# Patient Record
Sex: Female | Born: 1990 | Race: Black or African American | Hispanic: No | Marital: Single | Smoking: Never smoker
Health system: Southern US, Community
[De-identification: ages and names within clinical notes are randomized; demographics above are authoritative.]

## PROBLEM LIST (undated history)

## (undated) ENCOUNTER — Inpatient Hospital Stay (HOSPITAL_COMMUNITY): Payer: Self-pay

## (undated) ENCOUNTER — Emergency Department (HOSPITAL_COMMUNITY): Admission: EM | Payer: Self-pay | Source: Home / Self Care

## (undated) DIAGNOSIS — Q249 Congenital malformation of heart, unspecified: Secondary | ICD-10-CM

## (undated) DIAGNOSIS — O139 Gestational [pregnancy-induced] hypertension without significant proteinuria, unspecified trimester: Secondary | ICD-10-CM

## (undated) DIAGNOSIS — D649 Anemia, unspecified: Secondary | ICD-10-CM

## (undated) DIAGNOSIS — F419 Anxiety disorder, unspecified: Secondary | ICD-10-CM

## (undated) DIAGNOSIS — I10 Essential (primary) hypertension: Secondary | ICD-10-CM

## (undated) HISTORY — PX: NO PAST SURGERIES: SHX2092

## (undated) HISTORY — DX: Congenital malformation of heart, unspecified: Q24.9

## (undated) HISTORY — DX: Anxiety disorder, unspecified: F41.9

## (undated) HISTORY — DX: Anemia, unspecified: D64.9

---
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
## 2011-10-16 ENCOUNTER — Ambulatory Visit: Payer: Managed Care, Other (non HMO)

## 2011-10-16 DIAGNOSIS — H66019 Acute suppurative otitis media with spontaneous rupture of ear drum, unspecified ear: Secondary | ICD-10-CM

## 2011-10-16 DIAGNOSIS — H9209 Otalgia, unspecified ear: Secondary | ICD-10-CM

## 2012-01-12 ENCOUNTER — Encounter (HOSPITAL_COMMUNITY): Payer: Self-pay | Admitting: *Deleted

## 2012-01-12 ENCOUNTER — Emergency Department (HOSPITAL_COMMUNITY)
Admission: EM | Admit: 2012-01-12 | Discharge: 2012-01-13 | Disposition: A | Discharge status: Home / Self Care | Payer: Managed Care, Other (non HMO) | Attending: Emergency Medicine | Admitting: Emergency Medicine

## 2012-01-12 DIAGNOSIS — J111 Influenza due to unidentified influenza virus with other respiratory manifestations: Secondary | ICD-10-CM

## 2012-01-12 DIAGNOSIS — R000 Tachycardia, unspecified: Secondary | ICD-10-CM | POA: Insufficient documentation

## 2012-01-12 HISTORY — DX: Essential (primary) hypertension: I10

## 2012-01-12 MED ORDER — ACETAMINOPHEN 325 MG PO TABS
650.0000 mg | ORAL_TABLET | Freq: Once | ORAL | Status: AC
  Administered 2012-01-12: 650 mg via ORAL
  Filled 2012-01-12: qty 2

## 2012-01-12 NOTE — ED Notes (Signed)
Headache temp and body aches for 2-3 days.     Aching all over with a  Cold.  lmp  wed

## 2012-01-13 MED ORDER — SODIUM CHLORIDE 0.9 % IV BOLUS (SEPSIS)
1000.0000 mL | Freq: Once | INTRAVENOUS | Status: AC
  Administered 2012-01-13: 1000 mL via INTRAVENOUS

## 2012-01-13 NOTE — ED Notes (Signed)
Pt here for fever, chills, body aches, on and off over seveal days, sts feels somewhat better

## 2012-01-13 NOTE — ED Provider Notes (Signed)
History     CSN: [id number]  Arrival date & time 01/12/12  2041   First MD Initiated Contact with Patient 01/13/12 0003      Chief Complaint  Patient presents with  . Headache    (Consider location/radiation/quality/duration/timing/severity/associated sxs/prior treatment) HPI Comments: Patient here with fever, chills, headache, body aches on and off for the past several days - has been taking tylenol and motrin which helps with the symptoms but states continues to feel poorly - denies nausea, vomiting, neck stiffness, cough, chest congestion, reports runny nose, denies abdominal pain, vaginal discharge   Patient is a 21 y.o. female presenting with headaches. The history is provided by the patient. No language interpreter was used.  Headache  This is a new problem. The current episode started 2 days ago. The problem occurs constantly. The problem has not changed since onset.The headache is associated with nothing. The pain is located in the frontal region. The quality of the pain is described as throbbing. The pain is at a severity of 7/10. The pain is moderate. The pain does not radiate. Associated symptoms include a fever and malaise/fatigue. Pertinent negatives include no anorexia, no chest pressure, no near-syncope, no orthopnea, no palpitations, no syncope, no shortness of breath, no nausea and no vomiting. She has tried nothing for the symptoms. The treatment provided no relief.    Past Medical History  Diagnosis Date  . Hypertension     History reviewed. No pertinent past surgical history.  No family history on file.  History  Substance Use Topics  . Smoking status: Never Smoker   . Smokeless tobacco: Not on file  . Alcohol Use: No    OB History    Grav Para Term Preterm Abortions TAB SAB Ect Mult Living                  Review of Systems  Constitutional: Positive for fever and malaise/fatigue.  Respiratory: Negative for shortness of breath.   Cardiovascular:  Negative for palpitations, orthopnea, syncope and near-syncope.  Gastrointestinal: Negative for nausea, vomiting and anorexia.  Neurological: Positive for headaches.  All other systems reviewed and are negative.    Allergies  Review of patient's allergies indicates no known allergies.  Home Medications   Current Outpatient Rx  Name Route Sig Dispense Refill  . AMLODIPINE BESYLATE 5 MG PO TABS Oral Take 5 mg by mouth daily.      BP 141/87  Pulse 114  Temp(Src) 98.9 F (37.2 C) (Oral)  Resp 20  SpO2 100%  LMP 01/10/2012  Physical Exam  Nursing note and vitals reviewed. Constitutional: She is oriented to person, place, and time. She appears well-developed and well-nourished. No distress.  HENT:  Head: Normocephalic and atraumatic.  Right Ear: External ear normal.  Left Ear: External ear normal.  Nose: Nose normal.  Mouth/Throat: Oropharynx is clear and moist. No oropharyngeal exudate.  Eyes: Conjunctivae are normal. Pupils are equal, round, and reactive to light. No scleral icterus.  Neck: Normal range of motion. Neck supple.  Cardiovascular: Regular rhythm and normal heart sounds.  Exam reveals no gallop and no friction rub.   No murmur heard.      tachycardia  Pulmonary/Chest: Effort normal and breath sounds normal. No respiratory distress. She has no wheezes. She has no rales. She exhibits no tenderness.  Abdominal: Soft. Bowel sounds are normal. She exhibits no distension. There is no tenderness.  Musculoskeletal: Normal range of motion. She exhibits no edema and no tenderness.  Lymphadenopathy:  She has no cervical adenopathy.  Neurological: She is alert and oriented to person, place, and time. No cranial nerve deficit. She exhibits normal muscle tone. Coordination normal.  Skin: Skin is warm and dry. No rash noted. No erythema. No pallor.  Psychiatric: She has a normal mood and affect. Her behavior is normal. Judgment and thought content normal.    ED Course    Procedures (including critical care time)  Labs Reviewed - No data to display No results found.   1. Influenza-like illness       MDM  Patient remains tachycardic despite 1 liter of fluids, she reports that she feels much better - states tyelnol has helped her feel better, I believe the tachycardia to be related to the fever and ILI illness.  As she feels better, will discharge home.        Izola Price [place], [place] 01/13/12 [phone]

## 2012-01-13 NOTE — ED Provider Notes (Signed)
Medical screening examination/treatment/procedure(s) were performed by non-physician practitioner and as supervising physician I was immediately available for consultation/collaboration.  Olivia Mackie, MD 01/13/12 2131

## 2012-01-13 NOTE — Discharge Instructions (Signed)
Influenza Facts Flu (influenza) is a contagious respiratory illness caused by the influenza viruses. It can cause mild to severe illness. While most healthy people recover from the flu without specific treatment and without complications, older people, young children, and people with certain health conditions are at higher risk for serious complications from the flu, including death. CAUSES   The flu virus is spread from person to person by respiratory droplets from coughing and sneezing.   A person can also become infected by touching an object or surface with a virus on it and then touching their mouth, eye or nose.   Adults may be able to infect others from 1 day before symptoms occur and up to 7 days after getting sick. So it is possible to give someone the flu even before you know you are sick and continue to infect others while you are sick.  SYMPTOMS   Fever (usually high).   Headache.   Tiredness (can be extreme).   Cough.   Sore throat.   Runny or stuffy nose.   Body aches.   Diarrhea and vomiting may also occur, particularly in children.   These symptoms are referred to as "flu-like symptoms". A lot of different illnesses, including the common cold, can have similar symptoms.  DIAGNOSIS   There are tests that can determine if you have the flu as long you are tested within the first 2 or 3 days of illness.   A doctor's exam and additional tests may be needed to identify if you have a disease that is a complicating the flu.  RISKS AND COMPLICATIONS  Some of the complications caused by the flu include:  Bacterial pneumonia or progressive pneumonia caused by the flu virus.   Loss of body fluids (dehydration).   Worsening of chronic medical conditions, such as heart failure, asthma, or diabetes.   Sinus problems and ear infections.  HOME CARE INSTRUCTIONS   Seek medical care early on.   If you are at high risk from complications of the flu, consult your health-care  provider as soon as you develop flu-like symptoms. Those at high risk for complications include:   People 65 years or older.   People with chronic medical conditions, including diabetes.   Pregnant women.   Young children.   Your caregiver may recommend use of an antiviral medication to help treat the flu.   If you get the flu, get plenty of rest, drink a lot of liquids, and avoid using alcohol and tobacco.   You can take over-the-counter medications to relieve the symptoms of the flu if your caregiver approves. (Never give aspirin to children or teenagers who have flu-like symptoms, particularly fever).  PREVENTION  The single best way to prevent the flu is to get a flu vaccine each fall. Other measures that can help protect against the flu are:  Antiviral Medications   A number of antiviral drugs are approved for use in preventing the flu. These are prescription medications, and a doctor should be consulted before they are used.   Habits for Good Health   Cover your nose and mouth with a tissue when you cough or sneeze, throw the tissue away after you use it.   Wash your hands often with soap and water, especially after you cough or sneeze. If you are not near water, use an alcohol-based hand cleaner.   Avoid people who are sick.   If you get the flu, stay home from work or school. Avoid contact with   other people so that you do not make them sick, too.   Try not to touch your eyes, nose, or mouth as germs ore often spread this way.  IN CHILDREN, EMERGENCY WARNING SIGNS THAT NEED URGENT MEDICAL ATTENTION:  Fast breathing or trouble breathing.   Bluish skin color.   Not drinking enough fluids.   Not waking up or not interacting.   Being so irritable that the child does not want to be held.   Flu-like symptoms improve but then return with fever and worse cough.   Fever with a rash.  IN ADULTS, EMERGENCY WARNING SIGNS THAT NEED URGENT MEDICAL ATTENTION:  Difficulty  breathing or shortness of breath.   Pain or pressure in the chest or abdomen.   Sudden dizziness.   Confusion.   Severe or persistent vomiting.  SEEK IMMEDIATE MEDICAL CARE IF:  You or someone you know is experiencing any of the symptoms above. When you arrive at the emergency center,report that you think you have the flu. You may be asked to wear a mask and/or sit in a secluded area to protect others from getting sick. MAKE SURE YOU:   Understand these instructions.   Monitor your condition.   Seek medical care if you are getting worse, or not improving.  Document Released: 09/21/2003 Document Revised: 09/07/2011 Document Reviewed: 06/17/2009 [facility] Patient Information 2012 [place], [place].Influenza, Adult Influenza ("the flu") is a viral infection of the respiratory tract. It causes chills, fever, cough, headache, body aches, and sore throat. Influenza in general will make you feel sicker than when you have a common cold. Symptoms of the illness typically last a few days. Cough and fatigue may continue for as long as 7 to 10 days. Influenza is highly contagious. It spreads easily to others in the droplets from coughs and sneezes. People frequently become infected by touching something that was recently contaminated with the virus and then touch their mouth, nose or eyes. This infection is caused by a virus. Symptoms will not be reduced or improved by taking an antibiotic. Antibiotics are medications that kill bacteria, not viruses. DIAGNOSIS  Diagnosis of influenza is often made based on the history and physical examination as well as the presence of influenza reports occurring in your community. Testing can be done if the diagnosis is not certain. TREATMENT  Since influenza is caused by a virus, antibiotics are not helpful. Your caregiver may prescribe antiviral medicines to shorten the illness and lessen the severity. Your caregiver may also recommend influenza vaccination and/or  antiviral medicines for your family members in order to prevent the spread of influenza to them. HOME CARE INSTRUCTIONS  DO NOT GIVE ASPIRIN TO PERSONS WITH INFLUENZA WHO ARE UNDER 55 YEARS OF AGE. This could lead to brain and liver damage (Reye's syndrome). Read the label on over-the-counter medicines.   Stay home from work or school if at all possible until most of your symptoms are gone.   Only take over-the-counter or prescription medicines for pain, discomfort, or fever as directed by your caregiver.   Use a cool mist humidifier to increase air moisture. This will make breathing easier.   Rest until your temperature is nearly normal: 98.6 F (37 C). This usually takes 3 to 4 days. Be sure you get plenty of rest.   Drink at least eight, eight-ounce glasses of fluids per day. Fluids include water, juice, broth, gelatin, or lemonade.   Cover your mouth and nose when coughing or sneezing and wash your hands often to  prevent the spread of this virus to other persons.  PREVENTION  Annual influenza vaccination (flu shots) is the best way to avoid getting influenza. An annual flu shot is now routinely recommended for all adults in the [place] SEEK MEDICAL CARE IF:   You develop shortness of breath while resting.   You have a deep cough with production of mucous or chest pain.   You develop nausea (feeling sick to your stomach), vomiting, or diarrhea.  SEEK IMMEDIATE MEDICAL CARE IF:   You have difficulty breathing, become short of breath, or your skin or nails turn bluish.   You develop severe neck pain or stiffness.   You develop a severe headache, facial pain, or earache.   You have a fever.   You develop nausea or vomiting that cannot be controlled.  Document Released: 09/15/2000 Document Revised: 09/07/2011 Document Reviewed: 07/21/2009 [facility] Patient Information 2012 [place], [place].

## 2012-01-13 NOTE — ED Notes (Signed)
Unable to access signature page

## 2012-01-16 ENCOUNTER — Ambulatory Visit: Payer: Managed Care, Other (non HMO) | Admitting: Internal Medicine

## 2012-01-16 DIAGNOSIS — N12 Tubulo-interstitial nephritis, not specified as acute or chronic: Secondary | ICD-10-CM

## 2012-01-16 DIAGNOSIS — Z6833 Body mass index (BMI) 33.0-33.9, adult: Secondary | ICD-10-CM

## 2012-01-16 DIAGNOSIS — Z202 Contact with and (suspected) exposure to infections with a predominantly sexual mode of transmission: Secondary | ICD-10-CM

## 2012-01-16 DIAGNOSIS — L83 Acanthosis nigricans: Secondary | ICD-10-CM | POA: Insufficient documentation

## 2012-01-16 DIAGNOSIS — R509 Fever, unspecified: Secondary | ICD-10-CM

## 2012-01-16 DIAGNOSIS — N10 Acute tubulo-interstitial nephritis: Secondary | ICD-10-CM

## 2012-01-16 DIAGNOSIS — I517 Cardiomegaly: Secondary | ICD-10-CM

## 2012-01-16 LAB — POCT URINALYSIS DIPSTICK
Glucose, UA: NEGATIVE
Nitrite, UA: POSITIVE
Spec Grav, UA: 1.02
Urobilinogen, UA: 1
pH, UA: 5.5

## 2012-01-16 LAB — POCT CBC
Granulocyte percent: 73 %G (ref 37–80)
HCT, POC: 34.2 % — AB (ref 37.7–47.9)
Hemoglobin: 10.7 g/dL — AB (ref 12.2–16.2)
Lymph, poc: 2.5 (ref 0.6–3.4)
MCH, POC: 29.1 pg (ref 27–31.2)
MCHC: 31.3 g/dL — AB (ref 31.8–35.4)
MCV: 92.9 fL (ref 80–97)
MID (cbc): 0.9 (ref 0–0.9)
MPV: 7 fL (ref 0–99.8)
POC Granulocyte: 9.2 — AB (ref 2–6.9)
POC LYMPH PERCENT: 20 %L (ref 10–50)
POC MID %: 7 %M (ref 0–12)
Platelet Count, POC: 342 10*3/uL (ref 142–424)
RBC: 3.68 M/uL — AB (ref 4.04–5.48)
RDW, POC: 13.3 %
WBC: 12.6 10*3/uL — AB (ref 4.6–10.2)

## 2012-01-16 LAB — POCT UA - MICROSCOPIC ONLY
Casts, Ur, LPF, POC: NEGATIVE
Crystals, Ur, HPF, POC: NEGATIVE
Yeast, UA: NEGATIVE

## 2012-01-16 LAB — POCT URINE PREGNANCY: Preg Test, Ur: NEGATIVE

## 2012-01-16 LAB — POCT GLYCOSYLATED HEMOGLOBIN (HGB A1C): Hemoglobin A1C: 5.4

## 2012-01-16 MED ORDER — CEFTRIAXONE SODIUM 1 G IJ SOLR
1.0000 g | INTRAMUSCULAR | Status: DC
  Administered 2012-01-16: 1 g via INTRAMUSCULAR

## 2012-01-16 MED ORDER — CEFTRIAXONE SODIUM 1 G IJ SOLR
1.0000 g | Freq: Once | INTRAMUSCULAR | Status: AC
  Administered 2012-01-16: 1 g via INTRAMUSCULAR

## 2012-01-16 NOTE — Progress Notes (Signed)
Subjective:    Patient ID: Patty Gilmore, female    DOB: Dec 01, 1990, 21 y.o.   MRN: [id number]  HPIComplaining of headache and high fever for 4 or 5 days. Went to the ER on 4/12 with a diagnosis of influenza. Said she had blood work and was told was normal but there is nothing in chart review. Denies dysuria or frequency or urgency. Has occasional mild abdominal pain but nothing consistent. Last menstrual period was 8 days ago and was normal. She has recently had intercourse without barrier protection or any other form of contraception. There is no vaginal discharge since the last menses.    Would be interested in contraception  -Past history-concentric left ventricular hypertrophy-Last echo 2009 at [facility]/hypertension/Chlamydia 2009/obesity/Early atherosclerosis/acanthosis nigrans Review of Systems  Constitutional: Positive for chills.  HENT: Negative for ear pain, congestion, sore throat, trouble swallowing and neck pain.   Eyes: Negative for discharge.  Respiratory: Negative for cough.   Gastrointestinal: Negative for nausea, vomiting, diarrhea and abdominal distention.  Genitourinary: Negative for hematuria, genital sores, menstrual problem and pelvic pain.  Musculoskeletal: Negative for back pain and arthralgias.  Skin: Negative for rash.  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 Objective:   Physical ExamFever 102/Obviously uncomfortable HEENT is clear No anterior cervical adenopathy Lungs are clear Heart has a sustained tachycardia at 130 without murmurs The abdomen is soft nontender and nondistended without masses or organomegaly There is positive percussion tenderness radiating to the abdomen from the right costovertebral angle Skin has changes of acanthosis     Results for orders placed in visit on 01/16/12  POCT UA - MICROSCOPIC ONLY   Component Value Range   WBC, Ur, HPF, POC tntc     RBC, urine, microscopic 0-2     Bacteria, U Microscopic 3+     Mucus, UA small     Epithelial cells, urine per micros 2-10     Crystals, Ur, HPF, POC neg     Casts, Ur, LPF, POC neg     Yeast, UA neg     Amorphous moderate    POCT URINALYSIS DIPSTICK      Component Value Range   Color, UA yellow     Clarity, UA sl. cloudy     Glucose, UA neg     Bilirubin, UA small     Ketones, UA 80mg      Spec Grav, UA 1.020     Blood, UA small     pH, UA 5.5     Protein, UA 100mg      Urobilinogen, UA 1.0     Nitrite, UA positive     Leukocytes, UA small (1+)    POCT CBC      Component Value Range   WBC 12.6 (*) 4.6 - 10.2 (K/uL)   Lymph, poc 2.5  0.6 - 3.4    POC LYMPH PERCENT 20.0  10 - 50 (%L)   MID (cbc) 0.9  0 - 0.9    POC MID % 7.0  0 - 12 (%M)   POC Granulocyte 9.2 (*) 2 - 6.9    Granulocyte percent 73.0  37 - 80 (%G)   RBC 3.68 (*) 4.04 - 5.48 (M/uL)   Hemoglobin 10.7 (*) 12.2 - 16.2 (g/dL)   HCT, POC 40.9 (*) 81.1 - 47.9 (%)   MCV 92.9  80 - 97 (  fL)   MCH, POC 29.1  27 - 31.2 (pg)   MCHC 31.3 (*) 31.8 - 35.4 (g/dL)   RDW, POC 84.6     Platelet Count, POC 342  142 - 424 (K/uL)   MPV 7.0  0 - 99.8 (fL)  POCT URINE PREGNANCY      Component Value Range   Preg Test, Ur Negative    POCT GLYCOSYLATED HEMOGLOBIN (HGB A1C)      Component Value Range   Hemoglobin A1C 5.4      Assessment & Plan:  Problem #1 acute pyelonephritis Rocephin 1 g IM Cipro 500 twice a day #20 If still febrile tomorrow will do another injection otherwise  followup on Friday morning at 8 AM  Problem #2 sexually active without contraception On Friday Will start pills or DEPO and will do a uriprobe Add HIV and RPR today  Problem #3 obesity with acanthosis, hypertension, left ventricular hypertrophy, and a history of early atherosclerotic disease mention in the chart-she needs a hemoglobin A1c today  Problem #4 anemia with normal indices-she has had a  normal CBC in the past/we will recheck

## 2012-01-17 ENCOUNTER — Telehealth: Payer: Self-pay

## 2012-01-17 LAB — RPR

## 2012-01-17 LAB — HIV ANTIBODY (ROUTINE TESTING W REFLEX): HIV: NONREACTIVE

## 2012-01-17 MED ORDER — CIPROFLOXACIN HCL 500 MG PO TABS: 500.0000 mg | ORAL_TABLET | Freq: Two times a day (BID) | ORAL | Status: AC

## 2012-01-17 MED ORDER — TRAMADOL HCL 50 MG PO TABS: 50.0000 mg | ORAL_TABLET | Freq: Three times a day (TID) | ORAL | Status: AC | PRN

## 2012-01-17 NOTE — Telephone Encounter (Signed)
Pt was seen in office for kidney infection pt mom states Dr Merla Riches was supposed to have called a rx into [facility], she states she went to pick it up but rx was not there, she would also like to let Merla Riches know that pt is in a lot of pain and can something be called in for that as well.

## 2012-01-17 NOTE — Telephone Encounter (Signed)
Sent in RX from Dr Merla Riches note to [employer]. Mom said pt didn't do well as far as pain goes and wanted to know if we could call something in for pain. Please advise

## 2012-01-17 NOTE — Telephone Encounter (Signed)
Done, and sent in.  Patty Gilmore

## 2012-01-18 NOTE — Telephone Encounter (Signed)
Spoke with patient and let her know that rx was called into pharmacy.  Patient understood and will pick it up today.

## 2012-01-19 ENCOUNTER — Ambulatory Visit: Payer: Managed Care, Other (non HMO) | Admitting: Internal Medicine

## 2012-01-19 VITALS — BP 114/74 | HR 85 | Temp 98.0°F | Resp 16 | Ht 63.5 in | Wt 190.0 lb

## 2012-01-19 DIAGNOSIS — Z202 Contact with and (suspected) exposure to infections with a predominantly sexual mode of transmission: Secondary | ICD-10-CM

## 2012-01-19 DIAGNOSIS — N390 Urinary tract infection, site not specified: Secondary | ICD-10-CM

## 2012-01-19 DIAGNOSIS — Z23 Encounter for immunization: Secondary | ICD-10-CM

## 2012-01-19 MED ORDER — NORGESTIMATE-ETH ESTRADIOL 0.25-35 MG-MCG PO TABS: 1.0000 | ORAL_TABLET | Freq: Every day | ORAL | Status: DC

## 2012-01-19 NOTE — Progress Notes (Signed)
  Subjective:    Patient ID: Patty Gilmore, female    DOB: 01-23-1991, 21 y.o.   MRN: [id number]  HPIHere for followup of acute pyelonephritis Fever back pain both resolved/she feels back to normal with no urinary symptoms//  It is time for gardasil #3 She would like to start oral contraceptives Pap smear May 2012 within normal limits including STD screening One partner x6 months usually with condoms    Review of SystemsNoncontributory Note left ventricular hypertrophy with hypertension/last seen at [facility] in 2009 She has no current symptoms of chest pain shortness of breath with activity, or edema     Objective:   Physical Exam Overweight       Assessment & Plan:

## 2012-01-20 LAB — GC/CHLAMYDIA PROBE AMP, URINE
Chlamydia, Swab/Urine, PCR: NEGATIVE
GC Probe Amp, Urine: NEGATIVE

## 2012-01-20 LAB — URINE CULTURE: Colony Count: >=100000

## 2012-01-21 ENCOUNTER — Encounter: Payer: Self-pay | Admitting: Internal Medicine

## 2012-03-26 ENCOUNTER — Other Ambulatory Visit: Payer: Self-pay | Admitting: Family Medicine

## 2012-05-22 ENCOUNTER — Other Ambulatory Visit: Payer: Self-pay | Admitting: Physician Assistant

## 2012-07-18 ENCOUNTER — Other Ambulatory Visit: Payer: Self-pay | Admitting: Physician Assistant

## 2012-09-15 ENCOUNTER — Ambulatory Visit: Payer: Managed Care, Other (non HMO)

## 2012-10-16 ENCOUNTER — Encounter: Payer: Managed Care, Other (non HMO) | Admitting: Internal Medicine

## 2012-12-04 ENCOUNTER — Encounter: Payer: Managed Care, Other (non HMO) | Admitting: Internal Medicine

## 2012-12-25 ENCOUNTER — Ambulatory Visit: Payer: Managed Care, Other (non HMO) | Admitting: Physician Assistant

## 2012-12-25 VITALS — BP 136/86 | HR 102 | Temp 98.1°F | Resp 16 | Ht 62.5 in | Wt 192.2 lb

## 2012-12-25 DIAGNOSIS — I10 Essential (primary) hypertension: Secondary | ICD-10-CM

## 2012-12-25 DIAGNOSIS — N926 Irregular menstruation, unspecified: Secondary | ICD-10-CM

## 2012-12-25 DIAGNOSIS — N920 Excessive and frequent menstruation with regular cycle: Secondary | ICD-10-CM

## 2012-12-25 DIAGNOSIS — Z113 Encounter for screening for infections with a predominantly sexual mode of transmission: Secondary | ICD-10-CM

## 2012-12-25 LAB — POCT WET PREP WITH KOH
Clue Cells Wet Prep HPF POC: NEGATIVE
KOH Prep POC: NEGATIVE
Trichomonas, UA: NEGATIVE
WBC Wet Prep HPF POC: NEGATIVE
Yeast Wet Prep HPF POC: NEGATIVE

## 2012-12-25 LAB — POCT URINE PREGNANCY: Preg Test, Ur: NEGATIVE

## 2012-12-25 MED ORDER — AMLODIPINE BESYLATE 5 MG PO TABS: 5.0000 mg | ORAL_TABLET | Freq: Every day | ORAL | Status: DC

## 2012-12-25 NOTE — Patient Instructions (Signed)
Your tests today are all negative.  Let's watch and wait for the next week or so.  This may just been an irregular cycle for you.  If symptoms worsen though, please let us know.  I will let you know when the rest of your labs are back.   Safer Sex Your caregiver wants you to have this information about the infections that can be transmitted from sexual contact and how to prevent them. The idea behind safer sex is that you can be sexually active, and at the same time reduce the risk of giving or getting a sexually transmitted disease (STD). Every person should be aware of how to prevent him or herself and his or her sex partner from getting an STD. CAUSES OF STDS STDs are transmitted by sharing body fluids, which contain viruses and bacteria. The following fluids all transmit infections during sexual intercourse and sex acts:  Semen.  Saliva.  Urine.  Blood.  Vaginal mucus. Examples of STDs include:  Chlamydia.  Gonorrhea.  Genital herpes.  Hepatitis B.  Human immunodeficiency virus or acquired immunodeficiency syndrome (HIV or AIDS).  Syphilis.  Trichomonas.  Pubic lice.  Human papillomavirus (HPV), which may include:  Genital warts.  Cervical dysplasia.  Cervical cancer (can develop with certain types of HPV). SYMPTOMS  Sexual diseases often cause few or no symptoms until they are advanced, so a person can be infected and spread the infection without knowing it. Some STDs respond to treatment very well. Others, like HIV and herpes, cannot be cured, but are treated to reduce their effects. Specific symptoms include:  Abnormal vaginal discharge.  Irritation or itching in and around the vagina, and in the pubic hair.  Pain during sexual intercourse.  Bleeding during sexual intercourse.  Pelvic or abdominal pain.  Fever.  Growths in and around the vagina.  An ulcer in or around the vagina.  Swollen glands in the groin area. DIAGNOSIS   Blood tests.  Pap  test.  Culture test of abnormal vaginal discharge.  A test that applies a solution and examines the cervix with a lighted magnifying scope (colposcopy).  A test that examines the pelvis with a lighted tube, through a small incision (laparoscopy). TREATMENT  The treatment will depend on the cause of the STD.  Antibiotic treatment by injection, oral, creams, or suppositories in the vagina.  Over-the-counter medicated shampoo, to get rid of pubic lice.  Removing or treating growths with medicine, freezing, burning (electrocautery), or surgery.  Surgery treatment for HPV of the cervix.  Supportive medicines for herpes, HIV, AIDS, and hepatitis. Being careful cannot eliminate all risk of infection, but sex can be made much safer. Safe sexual practices include body massage and gentle touching. Masturbation is safe, as long as body fluids do not contact skin that has sores or cuts. Dry kissing and oral sex on a man wearing a latex condom or on a woman wearing a female condom is also safe. Slightly less safe is intercourse while the man wears a latex condom or wet kissing. It is also safer to have one sex partner that you know is not having sex with anyone else. LENGTH OF ILLNESS An STD might be treated and cured in a week, sometimes a month, or more. And it can linger with symptoms for many years. STDs can also cause damage to the female organs. This can cause chronic pain, infertility, and recurrence of the STD, especially herpes, hepatitis, HIV, and HPV. HOME CARE INSTRUCTIONS AND PREVENTION  Alcohol  and recreational drugs are often the reason given for not practicing safer sex. These substances affect your judgment. Alcohol and recreational drugs can also impair your immune system, making you more vulnerable to disease.  Do not engage in risky and dangerous sexual practices, including:  Vaginal or anal sex without a condom.  Oral sex on a man without a condom.  Oral sex on a woman  without a female condom.  Using saliva to lubricate a condom.  Any other sexual contact in which body fluids or blood from one partner contact the other partner.  You should use only latex condoms for men and water soluble lubricants. Petroleum based lubricants or oils used to lubricate a condom will weaken the condom and increase the chance that it will break.  Think very carefully before having sex with anyone who is high risk for STDs and HIV. This includes IV drug users, people with multiple sexual partners, or people who have had an STD, or a positive hepatitis or HIV blood test.  Remember that even if your partner has had only one previous partner, their previous partner might have had multiple partners. If so, you are at high risk of being exposed to an STD. You and your sex partner should be the only sex partners with each other, with no one else involved.  A vaccine is available for hepatitis B and HPV through your caregiver or the Public Health Department. Everyone should be vaccinated with these vaccines.  Avoid risky sex practices. Sex acts that can break the skin make you more likely to get an STD. SEEK MEDICAL CARE IF:   If you think you have an STD, even if you do not have any symptoms. Contact your caregiver for evaluation and treatment, if needed.  You think or know your sex partner has acquired an STD.  You have any of the symptoms mentioned above. Document Released: 10/26/2004 Document Revised: 12/11/2011 Document Reviewed: 08/18/2009 [facility] Patient Information 2013 [place], [place].

## 2012-12-25 NOTE — Progress Notes (Signed)
Subjective:    Patient ID: Patty Gilmore, female    DOB: 02/27/1991, 21 y.o.   MRN: [id number]  HPI  Patty Gilmore is a 22 yr old female here desiring STD testing and a pregnancy test.  Experienced some lower abdominal pains yesterday and then started spotting and having her period.  Has regular periods every month.  LMP 12/04/12.  Has never had an early period before.  States bleeding is not very heavy.  Has noticed intermittent nausea over the last couple weeks.  Denies vomiting, diarrhea, constipation, fever, chills, urinary symptoms, or change in appetite.    Sexually active with one female partner.  They use condoms about 50% of the time.  She denies vaginal discharge.  Last tested for STDs about 1 yr ago.  Did test positive for chlamydia about 3 yrs ago.  Using OCPs for contraception.  States she takes them faithfully.  Also requests RF on amlodipine  Review of Systems  Constitutional: Negative for fever and chills.  HENT: Negative.   Respiratory: Negative.   Cardiovascular: Negative.   Gastrointestinal: Positive for nausea and abdominal pain (lower).  Genitourinary: Negative for dysuria, frequency, hematuria, vaginal discharge and pelvic pain.  Neurological: Negative.        Objective:   Physical Exam  Vitals reviewed. Constitutional: She is oriented to person, place, and time. She appears well-developed and well-nourished. No distress.  HENT:  Head: Normocephalic and atraumatic.  Eyes: Conjunctivae are normal. No scleral icterus.  Cardiovascular: Normal rate, regular rhythm and normal heart sounds.   Pulmonary/Chest: Breath sounds normal. She has no wheezes. She has no rales.  Abdominal: Soft. Bowel sounds are normal. There is tenderness (very slight) in the suprapubic area. There is no rigidity, no rebound, no guarding and no CVA tenderness.  Neurological: She is alert and oriented to person, place, and time.  Skin: Skin is warm and dry.  Psychiatric: She has a normal mood and  affect. Her behavior is normal.    Filed Vitals:   12/25/12 2016  BP: 136/86  Pulse: 102  Temp: 98.1 F (36.7 C)  Resp: 16    Results for orders placed in visit on 12/25/12  POCT URINE PREGNANCY      Result Value Range   Preg Test, Ur Negative    POCT WET PREP WITH KOH      Result Value Range   Trichomonas, UA Negative     Clue Cells Wet Prep HPF POC neg     Epithelial Wet Prep HPF POC 2-5     Yeast Wet Prep HPF POC neg     Bacteria Wet Prep HPF POC trace     RBC Wet Prep HPF POC tntc     WBC Wet Prep HPF POC neg     KOH Prep POC Negative           Assessment & Plan:  Screen for STD (sexually transmitted disease) - Plan: GC/chlamydia probe amp, urine, HIV antibody, RPR, POCT Wet Prep with KOH  -- Pt is asymptomatic but has unprotected sex.  Uriprobe, HIV and RPR sent.  Wet prep neg for trichomonas.  Will follow up on labs and treat if necessary.  Abnormal menstrual periods - Plan: POCT urine pregnancy  -- Pregnancy test is negative today.  Exam is normal.  Pt states she takes OCPs as directed.  Bleeding is not currently heavy.  Etiology is unclear, but I think it is reasonable to watch and wait.  If bleeding persists or  becomes heavier, pt will RTC for further eval.    Hypertension - Plan: amLODipine (NORVASC) 5 MG tablet  -- Pt stable on 5mg  amlodipine.  Has been on this dose for about 2 yrs now.  Refilled today.    If worsening or not improving, pt will RTC.

## 2012-12-27 LAB — HIV ANTIBODY (ROUTINE TESTING W REFLEX): HIV: NONREACTIVE

## 2012-12-27 LAB — GC/CHLAMYDIA PROBE AMP, URINE
Chlamydia, Swab/Urine, PCR: NEGATIVE
GC Probe Amp, Urine: NEGATIVE

## 2012-12-27 LAB — RPR

## 2013-01-04 ENCOUNTER — Other Ambulatory Visit: Payer: Self-pay | Admitting: Internal Medicine

## 2013-03-09 ENCOUNTER — Telehealth: Payer: Self-pay

## 2013-03-09 MED ORDER — NORGESTIMATE-ETH ESTRADIOL 0.25-35 MG-MCG PO TABS: 1.0000 | ORAL_TABLET | Freq: Every day | ORAL | Status: DC

## 2013-03-09 NOTE — Telephone Encounter (Signed)
Rx refilled. I set it to phone in so that we can figure out which pharmacy she wants it sent to. She will need an OV with pap before additional refills can be provided.

## 2013-03-09 NOTE — Telephone Encounter (Signed)
Patient called requesting a refill on her birth control meds. She left the message with the answering service so there is no details as to where they prescription needs to go. Any further questions please call [phone]. Thanks

## 2013-03-10 MED ORDER — NORGESTIMATE-ETH ESTRADIOL 0.25-35 MG-MCG PO TABS: 1.0000 | ORAL_TABLET | Freq: Every day | ORAL | Status: DC

## 2013-03-10 NOTE — Telephone Encounter (Signed)
Called her to advise. Sent in for her.

## 2014-01-26 ENCOUNTER — Ambulatory Visit (INDEPENDENT_AMBULATORY_CARE_PROVIDER_SITE_OTHER): Payer: Managed Care, Other (non HMO) | Admitting: Internal Medicine

## 2014-01-26 VITALS — BP 128/86 | HR 98 | Temp 98.4°F | Resp 18 | Ht 63.0 in | Wt 215.0 lb

## 2014-01-26 DIAGNOSIS — Z0000 Encounter for general adult medical examination without abnormal findings: Secondary | ICD-10-CM

## 2014-01-26 DIAGNOSIS — R109 Unspecified abdominal pain: Secondary | ICD-10-CM

## 2014-01-26 DIAGNOSIS — Z6833 Body mass index (BMI) 33.0-33.9, adult: Secondary | ICD-10-CM

## 2014-01-26 DIAGNOSIS — I10 Essential (primary) hypertension: Secondary | ICD-10-CM

## 2014-01-26 DIAGNOSIS — Z23 Encounter for immunization: Secondary | ICD-10-CM

## 2014-01-26 DIAGNOSIS — I517 Cardiomegaly: Secondary | ICD-10-CM

## 2014-01-26 DIAGNOSIS — L83 Acanthosis nigricans: Secondary | ICD-10-CM

## 2014-01-26 DIAGNOSIS — R519 Headache, unspecified: Secondary | ICD-10-CM

## 2014-01-26 DIAGNOSIS — R51 Headache: Secondary | ICD-10-CM

## 2014-01-26 LAB — POCT GLYCOSYLATED HEMOGLOBIN (HGB A1C): Hemoglobin A1C: 5.6

## 2014-01-26 LAB — POCT URINALYSIS DIPSTICK
Bilirubin, UA: NEGATIVE
Blood, UA: NEGATIVE
Glucose, UA: NEGATIVE
Ketones, UA: NEGATIVE
Leukocytes, UA: NEGATIVE
Nitrite, UA: NEGATIVE
Protein, UA: NEGATIVE
Spec Grav, UA: 1.01
Urobilinogen, UA: 0.2
pH, UA: 5.5

## 2014-01-26 LAB — POCT CBC
Granulocyte percent: 53.7 %G (ref 37–80)
HCT, POC: 34.6 % — AB (ref 37.7–47.9)
Hemoglobin: 11 g/dL — AB (ref 12.2–16.2)
Lymph, poc: 3 (ref 0.6–3.4)
MCH, POC: 30.8 pg (ref 27–31.2)
MCHC: 31.8 g/dL (ref 31.8–35.4)
MCV: 97 fL (ref 80–97)
MID (cbc): 0.3 (ref 0–0.9)
MPV: 7.4 fL (ref 0–99.8)
POC Granulocyte: 3.8 (ref 2–6.9)
POC LYMPH PERCENT: 41.6 %L (ref 10–50)
POC MID %: 4.7 %M (ref 0–12)
Platelet Count, POC: 340 10*3/uL (ref 142–424)
RBC: 3.57 M/uL — AB (ref 4.04–5.48)
RDW, POC: 13.5 %
WBC: 7.1 10*3/uL (ref 4.6–10.2)

## 2014-01-26 MED ORDER — AMLODIPINE BESYLATE 5 MG PO TABS: 5.0000 mg | ORAL_TABLET | Freq: Every day | ORAL | Status: DC

## 2014-01-26 MED ORDER — TETANUS-DIPHTH-ACELL PERTUSSIS 5-2.5-18.5 LF-MCG/0.5 IM SUSP
0.5000 mL | Freq: Once | INTRAMUSCULAR | Status: AC
  Administered 2016-04-24: 0.5 mL via INTRAMUSCULAR

## 2014-01-26 NOTE — Progress Notes (Signed)
This chart was scribed for Patty Siaobert Ronniesha Seibold, MD by Patty Gilmore, Scribe. This patient was seen in room 14 and the patient's care was started at 5:50 PM.  Subjective:    Patient ID: Patty Gilmore, female    DOB: 10/29/1990, 23 y.o.   MRN: [id number]  HPI  HPI Comments: Patty Gilmore is a 23 y.o. Female who works at [employer] who presents to the [facility] requesting a complete physical examination. She states that she has been having intermittent headaches recently which she believes may be related to high blood pressure. Her BP here today was taken to be 128/86. She states that she has been off of blood pressure medications for a while. She states that she has a mild headache currently. She states that her headaches seem to be worse at night, and worse depending on what she eats. She states that her her headache is sometimes located on the left side of her head, sometimes on the right side, and sometimes over her bilateral temples. She states that she has mild associated blurred vision with her headaches. She states that she does not have associated nausea with her headaches. She states that she typically takes Advil or Excedrin with relief of her headaches.  She also reports that having intermittent, mild right-sided abdominal pain recently, located under her rib cage. She states that this pain is typically present in the mornings, but that the pain improves throughout the day. She states that she has not had any issues with constipation recently. She states that she typically eats breakfast. She denies any associated heartburn symptoms or SOB. She also denies having any urinary symptoms. She also denies noticing any rashes. She also denies noticing any lumps or masses in her breasts  She states that her LNMP ended yesterday. She states that she is not currently on birth control. She states that she has not been sexually active recently. She states that  when she is sexually active, she usually uses condoms, but not always.   Patient Active Problem List   Diagnosis Date Noted  . Acanthosis nigricans 01/16/2012  . BMI 33.0-33.9,adult 01/16/2012  . LVH (left ventricular hypertrophy) 01/16/2012    Review of Systems  Gastrointestinal: Positive for abdominal pain.  Neurological: Positive for headaches.  All other systems reviewed and are negative.     Objective:   Physical Exam  Nursing note and vitals reviewed. Constitutional: She is oriented to person, place, and time. She appears well-developed and well-nourished. No distress.  HENT:  Head: Normocephalic and atraumatic.  Right Ear: External ear normal.  Left Ear: External ear normal.  Nose: Nose normal.  Mouth/Throat: Oropharynx is clear and moist. No oropharyngeal exudate.  Eyes: Conjunctivae and EOM are normal. Pupils are equal, round, and reactive to light.  Neck: Neck supple. No thyromegaly present.  Cardiovascular: Normal rate, regular rhythm, normal heart sounds and intact distal pulses.   No murmur heard. Pulmonary/Chest: Effort normal and breath sounds normal. No respiratory distress.  Abdominal: Soft. She exhibits no distension and no mass. There is no tenderness. There is no rebound and no guarding.  Genitourinary: Vagina normal. No vaginal discharge found.  Musculoskeletal: Normal range of motion. She exhibits no edema.  Lymphadenopathy:    She has no cervical adenopathy.  Neurological: She is alert and oriented to person, place, and time. No cranial nerve deficit.  Skin: Skin is warm and dry.  Psychiatric: She has a normal mood and affect.  Her behavior is normal. Judgment and thought content normal.   Results for orders placed in visit on 01/26/14  POCT CBC      Result Value Ref Range   WBC 7.1  4.6 - 10.2 K/uL   Lymph, poc 3.0  0.6 - 3.4   POC LYMPH PERCENT 41.6  10 - 50 %L   MID (cbc) 0.3  0 - 0.9   POC MID % 4.7  0 - 12 %M   POC Granulocyte 3.8  2 - 6.9    Granulocyte percent 53.7  37 - 80 %G   RBC 3.57 (*) 4.04 - 5.48 M/uL   Hemoglobin 11.0 (*) 12.2 - 16.2 g/dL   HCT, POC 09.834.6 (*) 11.937.7 - 47.9 %   MCV 97.0  80 - 97 fL   MCH, POC 30.8  27 - 31.2 pg   MCHC 31.8  31.8 - 35.4 g/dL   RDW, POC 14.713.5     Platelet Count, POC 340  142 - 424 K/uL   MPV 7.4  0 - 99.8 fL  POCT URINALYSIS DIPSTICK      Result Value Ref Range   Color, UA yellow     Clarity, UA clear     Glucose, UA neg     Bilirubin, UA neg     Ketones, UA neg     Spec Grav, UA 1.010     Blood, UA neg     pH, UA 5.5     Protein, UA neg     Urobilinogen, UA 0.2     Nitrite, UA neg     Leukocytes, UA Negative    POCT GLYCOSYLATED HEMOGLOBIN (HGB A1C)      Result Value Ref Range   Hemoglobin A1C 5.6         BP 128/86  Pulse 98  Temp(Src) 98.4 F (36.9 C)  Resp 18  Ht 5\' 3"  (1.6 m)  Wt 215 lb (97.523 kg)  BMI 38.09 kg/m2  SpO2 100% Assessment & Plan:  . 1. Routine general medical examination at a health care facility   2. Abdominal pain,intermittent due to gas/poor diet  3. Headache   4. Unspecified essential hypertension   5.  Elevated BMI w/ Acanthosis nigrans and fam hx DM--A1C ok 6.  Mild anemia  Plan Rest norvasc Lose wt-disc diet and exer F/u 1 mo if HAs still present Pap#3  I have completed the patient encounter in its entirety as documented by the scribe, with editing by me where necessary. Patty Gilmore, M.D.

## 2014-01-27 LAB — COMPREHENSIVE METABOLIC PANEL
ALT: 22 U/L (ref 0–35)
AST: 15 U/L (ref 0–37)
Albumin: 4.2 g/dL (ref 3.5–5.2)
Alkaline Phosphatase: 74 U/L (ref 39–117)
BUN: 15 mg/dL (ref 6–23)
CO2: 24 mEq/L (ref 19–32)
Calcium: 9.5 mg/dL (ref 8.4–10.5)
Chloride: 104 mEq/L (ref 96–112)
Creat: 1.17 mg/dL — ABNORMAL HIGH (ref 0.50–1.10)
Glucose, Bld: 71 mg/dL (ref 70–99)
Potassium: 4.4 mEq/L (ref 3.5–5.3)
Sodium: 137 mEq/L (ref 135–145)
Total Bilirubin: 0.3 mg/dL (ref 0.2–1.2)
Total Protein: 7.3 g/dL (ref 6.0–8.3)

## 2014-01-29 ENCOUNTER — Other Ambulatory Visit: Payer: Self-pay | Admitting: *Deleted

## 2014-01-29 DIAGNOSIS — A599 Trichomoniasis, unspecified: Secondary | ICD-10-CM

## 2014-01-29 LAB — PAP IG, CT-NG, RFX HPV ASCU
Chlamydia Probe Amp: NEGATIVE
GC Probe Amp: NEGATIVE

## 2014-01-29 MED ORDER — METRONIDAZOLE 500 MG PO TABS: ORAL_TABLET | ORAL | Status: DC

## 2014-06-22 ENCOUNTER — Ambulatory Visit (INDEPENDENT_AMBULATORY_CARE_PROVIDER_SITE_OTHER): Payer: BC Managed Care – PPO | Admitting: Neurology

## 2014-06-22 ENCOUNTER — Encounter: Payer: Self-pay | Admitting: Neurology

## 2014-06-22 VITALS — BP 130/80 | HR 100 | Ht 62.0 in | Wt 222.0 lb

## 2014-06-22 DIAGNOSIS — I10 Essential (primary) hypertension: Secondary | ICD-10-CM

## 2014-06-22 DIAGNOSIS — H4710 Unspecified papilledema: Secondary | ICD-10-CM

## 2014-06-22 NOTE — Patient Instructions (Signed)
1. We have scheduled you at [facility] for your MRI on 07/03/2014 at 4:00 pm. Please arrive 15 minutes prior and go to 1st floor radiology. If you need to reschedule for any reason please call [phone].

## 2014-06-22 NOTE — Progress Notes (Signed)
Patty Gilmore was seen today in neurologic consultation at the request of Dr. Imogene Burn at [facility].  Her PCP is DOOLITTLE, Harrel Lemon, MD.  I reviewed the notes from her eye doctor.  The patient consented today for an eye examination on 06/01/2014.   Pt states that it had been 2 years since her prior examination.  She was really going for a routine examination. She complained of mild headache on the morning of presentation to her optometrist.  She reports that she has headaches and these are really not different from her typical headaches.  She admits that she had been off her BP medication and hadn't been wearing her glasses and that is what she thought was the source of the headaches.  Headaches are frontal mostly.  They are mild and throbbing.  No photophobia/phonophobia/nausea or emesis.   Takes excedrin or advil 1 day per week.  Headaches not disabiling and not causing vision change.  No OTC pills.  Been off birth control pills for 2 years.   However, when she was examined by Dr. 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  No VF done. The question became whether this was secondary to pseudotumor versus malignant hypertension.  Pt reports weight gain of 50-60 lbs over last 3 years.    Neuroimaging has not previously been performed.   PREVIOUS MEDICATIONS: n/a  ALLERGIES:  No Known Allergies  CURRENT MEDICATIONS:  Outpatient Encounter Prescriptions as of 06/22/2014  Medication Sig  . amLODipine (NORVASC) 5 MG tablet Take 1 tablet (5 mg total) by mouth daily.  . [DISCONTINUED] metroNIDAZOLE (FLAGYL) 500 MG tablet Take 4 tablets as one dose at one time.    PAST MEDICAL HISTORY:   Past Medical History  Diagnosis Date  . Hypertension     PAST SURGICAL HISTORY:   Past Surgical History  Procedure Laterality Date  . No past surgeries      SOCIAL HISTORY:   History   Social History  . Marital Status: Married    Spouse Name: N/A    Number of Children: N/A  . Years of  Education: N/A   Occupational History  .      [employer]   Social History Main Topics  . Smoking status: Never Smoker   . Smokeless tobacco: Not on file  . Alcohol Use: Yes     Comment: Rarely (less than once per month)  . Drug Use: No  . Sexual Activity: Yes   Other Topics Concern  . Not on file   Social History Narrative  . No narrative on file    FAMILY HISTORY:   Family Status  Relation Status Death Age  . Mother Alive     grave's disease  . Father Alive     healthy  . Sister Alive     healthy  . Brother Alive     healthy  . Maternal Grandmother Alive   . Maternal Grandfather Alive   . Paternal Grandmother Deceased   . Paternal Optometrist   . Brother Alive     ROS:  A complete 10 system review of systems was obtained and was unremarkable apart from what is mentioned above.  PHYSICAL EXAMINATION:    VITALS:   Filed Vitals:   06/22/14 0755  BP: 130/80  Pulse: 100  Height:  (1.575 m)  Weight: 222 lb (100.699 kg)   Body mass index is 40.59 kg/(m^2).   GEN:  Normal appears female in  no acute distress.  Appears stated age. HEENT:  Normocephalic, atraumatic. The mucous membranes are moist. The superficial temporal arteries are without ropiness or tenderness. Cardiovascular: Regular rate and rhythm. Lungs: Clear to auscultation bilaterally. Neck/Heme: There are no carotid bruits noted bilaterally.  NEUROLOGICAL: Orientation:  The patient is alert and oriented x 3.  Fund of knowledge is appropriate.  Recent and remote memory intact.  Attention span and concentration normal.  Repeats and names without difficulty. Cranial nerves: There is good facial symmetry. The pupils are equal round and reactive to light bilaterally. Fundoscopic exam reveals papilledema, L more than R. Extraocular muscles are intact and visual fields are full to confrontational testing. Speech is fluent and clear. Soft palate rises symmetrically and there is no tongue  deviation. Hearing is intact to conversational tone. Tone: Tone is good throughout. Sensation: Sensation is intact to light touch and pinprick throughout (facial, trunk, extremities). Vibration is intact at the bilateral big toe. There is no extinction with double simultaneous stimulation. There is no sensory dermatomal level identified. Coordination:  The patient has no difficulty with RAM's or FNF bilaterally. Motor: Strength is 5/5 in the bilateral upper and lower extremities.  Shoulder shrug is equal and symmetric. There is no pronator drift.  There are no fasciculations noted. DTR's: Deep tendon reflexes are 1/4 at the bilateral biceps, triceps, brachioradialis, patella and achilles.  Plantar responses are downgoing bilaterally. Gait and Station: The patient is able to ambulate without difficulty. The patient is able to heel toe walk without any difficulty. The patient is able to ambulate in a tandem fashion. The patient is able to stand in the Romberg position.   IMPRESSION/PLAN  1.  Papilledema  -The patient will have an MRI of the brain.  If that is unremarkable, she will have a lumbar puncture with opening pressure.  -Discussed no OTC vitamins/supplements  -Discussed lifestyle, diet, exercise  -If OP increased, will need VF done.  -Greater than 50% of visit in counseling and coordinating care 2.  Obesity and weight gain  -May be contributing to above.  Discussed diet/exercise 3. HTN  -back on BP meds  4.  Follow up after above completed for further recommendations

## 2014-06-22 NOTE — Progress Notes (Signed)
Note faxed to Dr Imogene Burn at [phone] with confirmation received.

## 2014-07-03 ENCOUNTER — Ambulatory Visit (HOSPITAL_COMMUNITY): Admission: RE | Admit: 2014-07-03 | Payer: BC Managed Care – PPO | Source: Ambulatory Visit

## 2014-07-14 ENCOUNTER — Ambulatory Visit (HOSPITAL_COMMUNITY)
Admission: RE | Admit: 2014-07-14 | Discharge: 2014-07-14 | Disposition: A | Discharge status: Home / Self Care | Payer: BC Managed Care – PPO | Source: Ambulatory Visit | Attending: Neurology | Admitting: Neurology

## 2014-07-14 DIAGNOSIS — R51 Headache: Secondary | ICD-10-CM | POA: Diagnosis not present

## 2014-07-14 DIAGNOSIS — I10 Essential (primary) hypertension: Secondary | ICD-10-CM | POA: Insufficient documentation

## 2014-07-14 DIAGNOSIS — H4710 Unspecified papilledema: Secondary | ICD-10-CM | POA: Insufficient documentation

## 2014-07-14 DIAGNOSIS — E669 Obesity, unspecified: Secondary | ICD-10-CM | POA: Diagnosis not present

## 2014-07-14 LAB — POCT I-STAT CREATININE: Creatinine, Ser: 1.4 mg/dL — ABNORMAL HIGH (ref 0.50–1.10)

## 2014-07-14 MED ORDER — GADOBENATE DIMEGLUMINE 529 MG/ML IV SOLN
20.0000 mL | Freq: Once | INTRAVENOUS | Status: AC | PRN
  Administered 2014-07-14: 20 mL via INTRAVENOUS

## 2014-07-15 ENCOUNTER — Other Ambulatory Visit: Payer: Self-pay | Admitting: Neurology

## 2014-07-15 ENCOUNTER — Telehealth: Payer: Self-pay | Admitting: Neurology

## 2014-07-15 DIAGNOSIS — H4710 Unspecified papilledema: Secondary | ICD-10-CM

## 2014-07-15 NOTE — Telephone Encounter (Signed)
Spoke with patient and made her aware MR showed papilledema and Dr Tat recommending lumbar puncture. She would like to set this up on a Monday. Orders Entered. LMOM for [facility]le [facility] to set this up. Patient made aware creatinine was high and copy of lab result faxed to Dr Merla Richesoolittle at [phone] with confirmation received. She will follow up with Dr Merla Richesoolittle re: results.

## 2014-07-15 NOTE — Telephone Encounter (Signed)
Patty Gilmore, please schedule LP with opening pressure for the patient.  Also, contact pt and PCP as her ISTAT creatinine was really high for her age (1.40) and should be f/u on

## 2014-07-15 NOTE — Telephone Encounter (Signed)
Left message on machine for patient to call back.

## 2014-07-20 ENCOUNTER — Encounter: Payer: Self-pay | Admitting: Internal Medicine

## 2014-07-20 ENCOUNTER — Other Ambulatory Visit: Payer: Self-pay | Admitting: Neurology

## 2014-07-20 ENCOUNTER — Telehealth: Payer: Self-pay | Admitting: Neurology

## 2014-07-20 ENCOUNTER — Ambulatory Visit
Admission: RE | Admit: 2014-07-20 | Discharge: 2014-07-20 | Disposition: A | Discharge status: Home / Self Care | Payer: BC Managed Care – PPO | Source: Ambulatory Visit | Attending: Neurology | Admitting: Neurology

## 2014-07-20 ENCOUNTER — Other Ambulatory Visit (HOSPITAL_COMMUNITY)
Admission: RE | Admit: 2014-07-20 | Discharge: 2014-07-20 | Disposition: A | Discharge status: Home / Self Care | Payer: BC Managed Care – PPO | Source: Ambulatory Visit | Attending: Neurology | Admitting: Neurology

## 2014-07-20 DIAGNOSIS — G932 Benign intracranial hypertension: Secondary | ICD-10-CM

## 2014-07-20 DIAGNOSIS — H4710 Unspecified papilledema: Secondary | ICD-10-CM

## 2014-07-20 HISTORY — PX: LUMBAR PUNCTURE: SHX1985

## 2014-07-20 LAB — GRAM STAIN

## 2014-07-20 LAB — PROTEIN, CSF: Total  Protein, CSF: 15 mg/dL (ref 15–45)

## 2014-07-20 LAB — CSF CELL COUNT WITH DIFFERENTIAL
RBC Count, CSF: 0 /mm3
Tube #: 3
WBC, CSF: 0 /mm3 (ref 0–5)

## 2014-07-20 LAB — GLUCOSE, CSF: Glucose, CSF: 53 mg/dL (ref 43–76)

## 2014-07-20 NOTE — Telephone Encounter (Signed)
LMOM after lumbar puncture to check on patient and make her aware I have scheduled her an appt on 07/27/2014 at 9:45 am. She is to call back if she can not make this appt.

## 2014-07-20 NOTE — Telephone Encounter (Signed)
Message copied by Silvio PateMCCRACKEN, Kirstina Leinweber L on Mon Jul 20, 2014 11:14 AM ------      Message from: TAT, REBECCA S      Created: Mon Jul 20, 2014 11:11 AM       Lesly RubensteinJade, see if the patient can come in at 9:45am the day that I come back ------

## 2014-07-20 NOTE — Discharge Instructions (Signed)

## 2014-07-23 LAB — CSF CULTURE W GRAM STAIN: Culture: NO GROWTH

## 2014-07-27 ENCOUNTER — Ambulatory Visit: Payer: BC Managed Care – PPO | Admitting: Neurology

## 2014-07-27 ENCOUNTER — Telehealth: Payer: Self-pay | Admitting: Neurology

## 2014-07-27 NOTE — Telephone Encounter (Signed)
Pt called today and reach appt from 07-27-14 to 07-31-14

## 2014-07-28 NOTE — Telephone Encounter (Signed)
appt marked as n o show since pt called the day of her appt to cancel and r/s. A no show letter mail was not sent / Sherri S.

## 2014-07-31 ENCOUNTER — Encounter: Payer: Self-pay | Admitting: Neurology

## 2014-07-31 ENCOUNTER — Ambulatory Visit (INDEPENDENT_AMBULATORY_CARE_PROVIDER_SITE_OTHER): Payer: BC Managed Care – PPO | Admitting: Neurology

## 2014-07-31 VITALS — BP 126/90 | HR 72 | Ht 62.0 in | Wt 223.0 lb

## 2014-07-31 DIAGNOSIS — G932 Benign intracranial hypertension: Secondary | ICD-10-CM | POA: Insufficient documentation

## 2014-07-31 MED ORDER — ACETAZOLAMIDE ER 500 MG PO CP12: 500.0000 mg | ORAL_CAPSULE | Freq: Two times a day (BID) | ORAL | Status: DC

## 2014-07-31 NOTE — Patient Instructions (Signed)
1. Start Diamox 500 mg twice daily - this medication has been sent to your pharmacy.  2. Visual Field testing scheduled with [facility] - Dr Imogene Burnhen on August 10, 2014 at 9:15 am. If you need to reschedule this you can call [phone]. 3. We have scheduled [facility] at [facility]  for your MRV on 08/17/14 at 9:00 am. Please arrive 15 minutes prior and go to 1st floor radiology. If you need to reschedule for any reason please call [phone].

## 2014-07-31 NOTE — Progress Notes (Signed)
Patty Gilmore was seen today in neurologic consultation at the request of Dr. Imogene Burnhen at [facility].  Her PCP is DOOLITTLE, Harrel LemonOBERT P, MD.  I reviewed the notes from her eye doctor.  The patient consented today for an eye examination on 06/01/2014.   Pt states that it had been 2 years since her prior examination.  She was really going for a routine examination. She complained of mild headache on the morning of presentation to her optometrist.  She reports that she has headaches and these are really not different from her typical headaches.  She admits that she had been off her BP medication and hadn't been wearing her glasses and that is what she thought was the source of the headaches.  Headaches are frontal mostly.  They are mild and throbbing.  No photophobia/phonophobia/nausea or emesis.   Takes excedrin or advil 1 day per week.  Headaches not disabiling and not causing vision change.  No OTC pills.  Been off birth control pills for 2 years.   However, when she was examined by Dr. 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  No VF done. The question became whether this was secondary to pseudotumor versus malignant hypertension.  Pt reports weight gain of 50-60 lbs over last 3 years.   07/31/14 update:  The patient returns today for followup.  Since our last visit, the patient did have a lumbar puncture performed.  Her opening pressure was 500 mm of water.  She then MRI of the brain on 07/14/2014 that I had the opportunity to review.  The optic nerve sheath and optic papillae were projecting into the optic globe which was consistent with papilledema.  It was otherwise unremarkable.  BP been better since on medication.  No headaches except one when she had a cold.  No vision change.     Neuroimaging has not previously been performed.   PREVIOUS MEDICATIONS: n/a  ALLERGIES:  No Known Allergies  CURRENT MEDICATIONS:  Outpatient Encounter Prescriptions as of 07/31/2014  Medication Sig   . amLODipine (NORVASC) 5 MG tablet Take 1 tablet (5 mg total) by mouth daily.    PAST MEDICAL HISTORY:   Past Medical History  Diagnosis Date  . Hypertension     PAST SURGICAL HISTORY:   Past Surgical History  Procedure Laterality Date  . No past surgeries      SOCIAL HISTORY:   History   Social History  . Marital Status: Single    Spouse Name: N/A    Number of Children: N/A  . Years of Education: N/A   Occupational History  .      [employer]   Social History Main Topics  . Smoking status: Never Smoker   . Smokeless tobacco: Not on file  . Alcohol Use: Yes     Comment: Rarely (less than once per month)  . Drug Use: No  . Sexual Activity: Yes   Other Topics Concern  . Not on file   Social History Narrative  . No narrative on file    FAMILY HISTORY:   Family Status  Relation Status Death Age  . Mother Alive     grave's disease  . Father Alive     healthy  . Sister Alive     healthy  . Brother Alive     healthy  . Maternal Grandmother Alive   . Maternal Grandfather Alive   . Paternal Grandmother Deceased   . Paternal OptometristGrandfather Alive   .  Brother Alive     ROS:  A complete 10 system review of systems was obtained and was unremarkable apart from what is mentioned above.  PHYSICAL EXAMINATION:    VITALS:   Filed Vitals:   07/31/14 0801  BP: 126/90  Pulse: 72  Height: 5\' 2"  (1.575 m)  Weight: 223 lb (101.152 kg)   Body mass index is 40.78 kg/(m^2).  Wt Readings from Last 3 Encounters:  07/31/14 223 lb (101.152 kg)  06/22/14 222 lb (100.699 kg)  01/26/14 215 lb (97.523 kg)    GEN:  Normal appears female in no acute distress.  Appears stated age. HEENT:  Normocephalic, atraumatic. The mucous membranes are moist. The superficial temporal arteries are without ropiness or tenderness. Cardiovascular: Regular rate and rhythm. Lungs: Clear to auscultation bilaterally. Neck/Heme: There are no carotid bruits noted  bilaterally.  NEUROLOGICAL: Orientation:  The patient is alert and oriented x 3.  Fund of knowledge is appropriate.  Recent and remote memory intact.  Attention span and concentration normal.  Repeats and names without difficulty. Cranial nerves: There is good facial symmetry. The pupils are equal round and reactive to light bilaterally. Fundoscopic exam reveals papilledema, L more than R. Extraocular muscles are intact and visual fields are full to confrontational testing. Speech is fluent and clear. Soft palate rises symmetrically and there is no tongue deviation. Hearing is intact to conversational tone. Tone: Tone is good throughout. Sensation: Sensation is intact to light touch and pinprick throughout (facial, trunk, extremities). Vibration is intact at the bilateral big toe. There is no extinction with double simultaneous stimulation. There is no sensory dermatomal level identified. Coordination:  The patient has no difficulty with RAM's or FNF bilaterally. Motor: Strength is 5/5 in the bilateral upper and lower extremities.  Shoulder shrug is equal and symmetric. There is no pronator drift.  There are no fasciculations noted. DTR's: Deep tendon reflexes are 1/4 at the bilateral biceps, triceps, brachioradialis, patella and achilles.  Plantar responses are downgoing bilaterally. Gait and Station: The patient is able to ambulate without difficulty. The patient is able to heel toe walk without any difficulty. The patient is able to ambulate in a tandem fashion. The patient is able to stand in the Romberg position.   IMPRESSION/PLAN  1. pseudotumor cerebri  -The patient will be sent back for an MRV of the brain to rule out central venous thrombosis  -Discussed no OTC vitamins/supplements  -Discussed lifestyle, diet, exercise.  Discussed the importance of weight loss and that this is absolutely an essential component in the treatment of this disease.  -We will initiate Diamox, 500 mg twice a  day.  She will need her potassium checked in 2 weeks.  -Greater than 50% of visit in counseling and coordinating care 2.  Obesity and weight gain  -May be contributing to above.  As above re: importance of diet and exercise. 3. HTN  -back on BP meds and doing well 4.  Follow up in next few months.  Greater than 50% in counseling as above.  Total visit time was 25 min.

## 2014-08-14 ENCOUNTER — Telehealth: Payer: Self-pay | Admitting: Neurology

## 2014-08-14 NOTE — Telephone Encounter (Signed)
Spoke with radiology scheduling at [facility] that states MRV not authorized for appt on Monday. Spoke with [place] and she states this will be authorized before appt and no need to r/s.

## 2014-08-17 ENCOUNTER — Telehealth: Payer: Self-pay | Admitting: *Deleted

## 2014-08-17 ENCOUNTER — Ambulatory Visit (HOSPITAL_COMMUNITY): Admission: RE | Admit: 2014-08-17 | Payer: BC Managed Care – PPO | Source: Ambulatory Visit

## 2014-08-17 ENCOUNTER — Ambulatory Visit (HOSPITAL_COMMUNITY): Payer: BC Managed Care – PPO

## 2014-08-17 NOTE — Telephone Encounter (Signed)
See previous message, pt has called to see about getting appt r/s. CB# [phone] / Sherri S.

## 2014-08-17 NOTE — Telephone Encounter (Signed)
Spoke with patient and she states since she was called on Friday and told her MRV for this morning was cancelled that she no longer has a ride to her appt today since they put this back on the schedule after insurance approval obtained this morning. She tried to r/s with scheduling department and they would not move appt. I called and spoke with them and moved appt to 07/26/2014 at 2:00 pm. Patient made aware.

## 2014-08-17 NOTE — Telephone Encounter (Signed)
Peggy at [facility] called 08/14/14 at 5:18pm  and ask for a prior auth for this patient that was scheduled for a MRV in July 31 2014   This was to be done on 08/20/14 she stated taht no prior Berkley Harveyauth was gotten as of 08/14/14 at 5:18am and they were going to cancel the test . She ask for Lesly RubensteinJade and said she needed to leave her a message I explained to her that Lesly Rubensteinjade was gone for the day and  she was not the person who gets our  the prior auth  I gave her Janell's number she was going to cancel the test for this patient

## 2014-08-26 ENCOUNTER — Ambulatory Visit (HOSPITAL_COMMUNITY)
Admission: RE | Admit: 2014-08-26 | Discharge: 2014-08-26 | Disposition: A | Discharge status: Home / Self Care | Payer: BC Managed Care – PPO | Source: Ambulatory Visit | Attending: Neurology | Admitting: Neurology

## 2014-08-26 DIAGNOSIS — J3489 Other specified disorders of nose and nasal sinuses: Secondary | ICD-10-CM | POA: Diagnosis not present

## 2014-08-26 DIAGNOSIS — G932 Benign intracranial hypertension: Secondary | ICD-10-CM | POA: Diagnosis present

## 2014-09-02 ENCOUNTER — Telehealth: Payer: Self-pay | Admitting: Neurology

## 2014-09-02 NOTE — Telephone Encounter (Signed)
Patient made aware.

## 2014-09-02 NOTE — Telephone Encounter (Signed)
-----   Message from Octaviano Battyebecca S Tat, DO sent at 09/02/2014 11:28 AM EST ----- Regarding: RE: results I spoke with radiologist about them and they are okay.  No clot. ----- Message -----    From: Silvio PateJade L Lee-Anne Flicker, CMA    Sent: 09/02/2014   9:31 AM      To: Octaviano Battyebecca S Tat, DO Subject: results                                        Have you reviewed MRV results?

## 2014-09-02 NOTE — Telephone Encounter (Signed)
Left message on machine for patient to call back. To make her aware MRV showed no clots.

## 2014-09-14 ENCOUNTER — Telehealth: Payer: Self-pay | Admitting: Neurology

## 2014-09-14 NOTE — Telephone Encounter (Signed)
-----   Message from Octaviano Battyebecca S Tat, DO sent at 09/14/2014 10:53 AM EST ----- She needs to make sure that she gets labs done to monitor meds ----- Message -----    From: SYSTEM    Sent: 08/05/2014  12:04 AM      To: Octaviano Battyebecca S Tat, DO

## 2014-09-14 NOTE — Telephone Encounter (Signed)
Called patient and she states she will get labs done this week.

## 2014-09-29 ENCOUNTER — Other Ambulatory Visit: Payer: Self-pay | Admitting: Neurology

## 2014-09-29 LAB — BASIC METABOLIC PANEL
BUN: 15 mg/dL (ref 6–23)
CO2: 20 mEq/L (ref 19–32)
Calcium: 9.3 mg/dL (ref 8.4–10.5)
Chloride: 110 mEq/L (ref 96–112)
Creat: 1.13 mg/dL — ABNORMAL HIGH (ref 0.50–1.10)
Glucose, Bld: 83 mg/dL (ref 70–99)
Potassium: 4.9 mEq/L (ref 3.5–5.3)
Sodium: 137 mEq/L (ref 135–145)

## 2014-11-02 ENCOUNTER — Ambulatory Visit: Payer: BC Managed Care – PPO | Admitting: Neurology

## 2014-11-02 DIAGNOSIS — Z029 Encounter for administrative examinations, unspecified: Secondary | ICD-10-CM

## 2014-11-03 ENCOUNTER — Telehealth: Payer: Self-pay | Admitting: Neurology

## 2014-11-03 NOTE — Telephone Encounter (Signed)
Pt no showed 11/02/14 appt w/ Dr. Arbutus Leasat. Appt was confirmed during reminder calls. No show letter + policy mailed to pt / Sherri S.

## 2014-11-09 ENCOUNTER — Telehealth: Payer: Self-pay | Admitting: Neurology

## 2014-11-09 NOTE — Telephone Encounter (Signed)
Records faxed to [facility] attn: Dr Imogene Burnhen at [phone] with confirmation received with a note letting them know that patient no showed last appt with our office.

## 2014-12-08 ENCOUNTER — Emergency Department (HOSPITAL_COMMUNITY)
Admission: EM | Admit: 2014-12-08 | Discharge: 2014-12-08 | Disposition: A | Discharge status: Home / Self Care | Payer: Self-pay | Attending: Emergency Medicine | Admitting: Emergency Medicine

## 2014-12-08 ENCOUNTER — Emergency Department (HOSPITAL_COMMUNITY): Payer: Self-pay

## 2014-12-08 ENCOUNTER — Encounter (HOSPITAL_COMMUNITY): Payer: Self-pay

## 2014-12-08 DIAGNOSIS — N730 Acute parametritis and pelvic cellulitis: Secondary | ICD-10-CM | POA: Insufficient documentation

## 2014-12-08 DIAGNOSIS — Z3202 Encounter for pregnancy test, result negative: Secondary | ICD-10-CM | POA: Insufficient documentation

## 2014-12-08 DIAGNOSIS — Z79899 Other long term (current) drug therapy: Secondary | ICD-10-CM | POA: Insufficient documentation

## 2014-12-08 DIAGNOSIS — I10 Essential (primary) hypertension: Secondary | ICD-10-CM | POA: Insufficient documentation

## 2014-12-08 DIAGNOSIS — R109 Unspecified abdominal pain: Secondary | ICD-10-CM

## 2014-12-08 LAB — COMPREHENSIVE METABOLIC PANEL
ALT: 17 U/L (ref 0–35)
AST: 19 U/L (ref 0–37)
Albumin: 3.8 g/dL (ref 3.5–5.2)
Alkaline Phosphatase: 79 U/L (ref 39–117)
Anion gap: 11 (ref 5–15)
BUN: 10 mg/dL (ref 6–23)
CO2: 21 mmol/L (ref 19–32)
Calcium: 9.4 mg/dL (ref 8.4–10.5)
Chloride: 104 mmol/L (ref 96–112)
Creatinine, Ser: 1.29 mg/dL — ABNORMAL HIGH (ref 0.50–1.10)
GFR calc Af Amer: 68 mL/min — ABNORMAL LOW (ref >90)
GFR calc non Af Amer: 58 mL/min — ABNORMAL LOW (ref >90)
Glucose, Bld: 94 mg/dL (ref 70–99)
Potassium: 3.4 mmol/L — ABNORMAL LOW (ref 3.5–5.1)
Sodium: 136 mmol/L (ref 135–145)
Total Bilirubin: 0.8 mg/dL (ref 0.3–1.2)
Total Protein: 8 g/dL (ref 6.0–8.3)

## 2014-12-08 LAB — CBC WITH DIFFERENTIAL/PLATELET
Basophils Absolute: 0 10*3/uL (ref 0.0–0.1)
Basophils Relative: 0 % (ref 0–1)
Eosinophils Absolute: 0 10*3/uL (ref 0.0–0.7)
Eosinophils Relative: 0 % (ref 0–5)
HCT: 34.7 % — ABNORMAL LOW (ref 36.0–46.0)
Hemoglobin: 11.6 g/dL — ABNORMAL LOW (ref 12.0–15.0)
Lymphocytes Relative: 14 % (ref 12–46)
Lymphs Abs: 2 10*3/uL (ref 0.7–4.0)
MCH: 31.5 pg (ref 26.0–34.0)
MCHC: 33.4 g/dL (ref 30.0–36.0)
MCV: 94.3 fL (ref 78.0–100.0)
Monocytes Absolute: 0.6 10*3/uL (ref 0.1–1.0)
Monocytes Relative: 5 % (ref 3–12)
Neutro Abs: 11.4 10*3/uL — ABNORMAL HIGH (ref 1.7–7.7)
Neutrophils Relative %: 81 % — ABNORMAL HIGH (ref 43–77)
Platelets: 332 10*3/uL (ref 150–400)
RBC: 3.68 MIL/uL — ABNORMAL LOW (ref 3.87–5.11)
RDW: 12.5 % (ref 11.5–15.5)
WBC: 14.1 10*3/uL — ABNORMAL HIGH (ref 4.0–10.5)

## 2014-12-08 LAB — WET PREP, GENITAL
Trich, Wet Prep: NONE SEEN
Yeast Wet Prep HPF POC: NONE SEEN

## 2014-12-08 LAB — URINALYSIS, ROUTINE W REFLEX MICROSCOPIC
Glucose, UA: NEGATIVE mg/dL
Hgb urine dipstick: NEGATIVE
Ketones, ur: 15 mg/dL — AB
Nitrite: NEGATIVE
Protein, ur: NEGATIVE mg/dL
Specific Gravity, Urine: 1.023 (ref 1.005–1.030)
Urobilinogen, UA: 1 mg/dL (ref 0.0–1.0)
pH: 6 (ref 5.0–8.0)

## 2014-12-08 LAB — POC URINE PREG, ED: Preg Test, Ur: NEGATIVE

## 2014-12-08 LAB — URINE MICROSCOPIC-ADD ON

## 2014-12-08 MED ORDER — HYDROCODONE-ACETAMINOPHEN 5-325 MG PO TABS: 1.0000 | ORAL_TABLET | Freq: Four times a day (QID) | ORAL | Status: DC | PRN

## 2014-12-08 MED ORDER — SODIUM CHLORIDE 0.9 % IV BOLUS (SEPSIS)
1000.0000 mL | Freq: Once | INTRAVENOUS | Status: AC
Start: 2014-12-08 — End: 2014-12-08
  Administered 2014-12-08: 1000 mL via INTRAVENOUS

## 2014-12-08 MED ORDER — ONDANSETRON HCL 4 MG/2ML IJ SOLN
4.0000 mg | Freq: Once | INTRAMUSCULAR | Status: AC
  Administered 2014-12-08: 4 mg via INTRAVENOUS
  Filled 2014-12-08: qty 2

## 2014-12-08 MED ORDER — CEFTRIAXONE SODIUM 250 MG IJ SOLR
250.0000 mg | Freq: Once | INTRAMUSCULAR | Status: AC
  Administered 2014-12-08: 250 mg via INTRAMUSCULAR
  Filled 2014-12-08: qty 250

## 2014-12-08 MED ORDER — METRONIDAZOLE 500 MG PO TABS: 500.0000 mg | ORAL_TABLET | Freq: Two times a day (BID) | ORAL | Status: DC

## 2014-12-08 MED ORDER — LIDOCAINE HCL (PF) 1 % IJ SOLN
5.0000 mL | Freq: Once | INTRAMUSCULAR | Status: AC
  Administered 2014-12-08: 2.1 mL
  Filled 2014-12-08: qty 5

## 2014-12-08 MED ORDER — DOXYCYCLINE HYCLATE 100 MG PO CAPS: 100.0000 mg | ORAL_CAPSULE | Freq: Two times a day (BID) | ORAL | Status: DC

## 2014-12-08 MED ORDER — LIDOCAINE HCL (PF) 1 % IJ SOLN
INTRAMUSCULAR | Status: AC
  Filled 2014-12-08: qty 5

## 2014-12-08 MED ORDER — MORPHINE SULFATE 4 MG/ML IJ SOLN
4.0000 mg | Freq: Once | INTRAMUSCULAR | Status: AC
  Administered 2014-12-08: 4 mg via INTRAVENOUS
  Filled 2014-12-08: qty 1

## 2014-12-08 NOTE — ED Notes (Signed)
Pt states her abd started hurting Saturday but was coming off her menses. Pain started getting worse this morning. Had a lot of pressure when she tried to void. Pt having mid lower abd pain. No nausea, vomiting, or diarrhea.

## 2014-12-08 NOTE — ED Provider Notes (Signed)
CSN: [id number]     Arrival date & time 12/08/14  1743 History   First MD Initiated Contact with Patient 12/08/14 1944     Chief Complaint  Patient presents with  . Abdominal Pain     (Consider location/radiation/quality/duration/timing/severity/associated sxs/prior Treatment) HPI Comments: Patient presents today with a chief complaint of abdominal pain.  She reports that she began having suprapubic abdominal pain 3 days ago, which is gradually worsening.  Pain does not radiate.  She describes the pain as a pressure.  She took Excedrin, but does not feel that it helps the pain.  She denies fever.  However, temp is 100 F orally upon arrival in the ED.  She denies nausea, vomiting, or diarrhea.  Last BM was this morning and was normal.  She denies vaginal discharge.  Denies dysuria, increased urinary frequency, urgency, or flank pain.  She is sexually active.  LMP was 12/02/14.    Patient is a 24 y.o. female presenting with abdominal pain. The history is provided by the patient.  Abdominal Pain   Past Medical History  Diagnosis Date  . Hypertension    Past Surgical History  Procedure Laterality Date  . No past surgeries     Family History  Problem Relation Age of Onset  . Diabetes Maternal Grandmother   . Diabetes Paternal Grandmother    History  Substance Use Topics  . Smoking status: Never Smoker   . Smokeless tobacco: Not on file  . Alcohol Use: Yes     Comment: Rarely (less than once per month)   OB History    No data available     Review of Systems  Gastrointestinal: Positive for abdominal pain.  All other systems reviewed and are negative.     Allergies  Review of patient's allergies indicates no known allergies.  Home Medications   Prior to Admission medications   Medication Sig Start Date End Date Taking? Authorizing Provider  acetaZOLAMIDE (DIAMOX SEQUELS) 500 MG capsule Take 1 capsule (500 mg total) by mouth 2 (two) times daily. 07/31/14   Rebecca S Tat, DO   amLODipine (NORVASC) 5 MG tablet Take 1 tablet (5 mg total) by mouth daily. 01/26/14   Tonye Pearson, MD   BP 122/66 mmHg  Pulse 125  Temp(Src) 100 F (37.8 C)  Resp 18  SpO2 98%  LMP 12/02/2014 Physical Exam  Constitutional: She appears well-developed and well-nourished. No distress.  HENT:  Head: Normocephalic and atraumatic.  Mouth/Throat: Oropharynx is clear and moist.  Neck: Normal range of motion. Neck supple.  Cardiovascular: Normal rate, regular rhythm and normal heart sounds.   Pulmonary/Chest: Effort normal and breath sounds normal.  Abdominal: Soft. Bowel sounds are normal. She exhibits no distension and no mass. There is tenderness. There is no rebound, no guarding, no CVA tenderness, no tenderness at McBurney's point and negative Murphy's sign.  Diffuse abdominal tenderness to palpation.  Worse in the suprapubic region  Genitourinary: Cervix exhibits motion tenderness and discharge. Right adnexum displays no mass, no tenderness and no fullness. Left adnexum displays no mass, no tenderness and no fullness.  Thick yellowish colored discharge of the vaginal vault  Neurological: She is alert.  Skin: Skin is warm and dry. She is not diaphoretic.  Psychiatric: She has a normal mood and affect.  Nursing note and vitals reviewed.   ED Course  Procedures (including critical care time) Labs Review Labs Reviewed  WET PREP, GENITAL - Abnormal; Notable for the following:    Clue Cells  Wet Prep HPF POC FEW (*)    WBC, Wet Prep HPF POC MANY (*)    All other components within normal limits  COMPREHENSIVE METABOLIC PANEL - Abnormal; Notable for the following:    Potassium 3.4 (*)    Creatinine, Ser 1.29 (*)    GFR calc non Af Amer 58 (*)    GFR calc Af Amer 68 (*)    All other components within normal limits  CBC WITH DIFFERENTIAL/PLATELET - Abnormal; Notable for the following:    WBC 14.1 (*)    RBC 3.68 (*)    Hemoglobin 11.6 (*)    HCT 34.7 (*)    Neutrophils  Relative % 81 (*)    Neutro Abs 11.4 (*)    All other components within normal limits  URINALYSIS, ROUTINE W REFLEX MICROSCOPIC - Abnormal; Notable for the following:    Color, Urine AMBER (*)    Bilirubin Urine SMALL (*)    Ketones, ur 15 (*)    Leukocytes, UA SMALL (*)    All other components within normal limits  URINE CULTURE  URINE MICROSCOPIC-ADD ON  HIV ANTIBODY (ROUTINE TESTING)  POC URINE PREG, ED  GC/CHLAMYDIA PROBE AMP ([place])    Imaging Review US Transvaginal Non-ob  12/08/2014   CLINICAL DATA:  Midline pelvic pain for 3 days, worse this morning.  EXAM: TRANSABDOMINAL AND TRANSVAGINAL ULTRASOUND OF PELVIS  TECHNIQUE: Both transabdominal and transvaginal ultrasound examinations of the pelvis were performed. Transabdominal technique was performed for global imaging of the pelvis including uterus, ovaries, adnexal regions, and pelvic cul-de-sac. It was necessary to proceed with endovaginal exam following the transabdominal exam to visualize the uterus and endometrium.  COMPARISON:  None  FINDINGS: Uterus  Measurements: 7.4 x 2.9 x 3.6 cm. No fibroids or other mass visualized.  Endometrium  Thickness: 2 mm.  No focal abnormality visualized.  Right ovary  Measurements: 4.3 x 2.9 x 3.2 cm. Normal appearance/no adnexal mass.  Left ovary  Measurements: 4.3 x 2.4 x 4 cm. There appears to be an involuting hemorrhagic cyst measuring 3 x 1 x 2.3 cm. No significant internal flow demonstrated. No abnormal adnexal masses demonstrated.  Other findings  Small amount of free fluid is demonstrated in the cul-de-sac. Fluid is somewhat echogenic, suggesting hemorrhagic fluid. This is likely physiologic.  IMPRESSION: Normal ultrasound appearance of the uterus and right ovary. Left ovary demonstrates what appears to be an involuting hemorrhagic cyst. Small amount of free fluid in the cul-de-sac. Findings are likely physiologic.   Electronically Signed   By: Burman Nieves M.D.   On: 12/08/2014 22:29    US Pelvis Complete  12/08/2014   CLINICAL DATA:  Midline pelvic pain for 3 days, worse this morning.  EXAM: TRANSABDOMINAL AND TRANSVAGINAL ULTRASOUND OF PELVIS  TECHNIQUE: Both transabdominal and transvaginal ultrasound examinations of the pelvis were performed. Transabdominal technique was performed for global imaging of the pelvis including uterus, ovaries, adnexal regions, and pelvic cul-de-sac. It was necessary to proceed with endovaginal exam following the transabdominal exam to visualize the uterus and endometrium.  COMPARISON:  None  FINDINGS: Uterus  Measurements: 7.4 x 2.9 x 3.6 cm. No fibroids or other mass visualized.  Endometrium  Thickness: 2 mm.  No focal abnormality visualized.  Right ovary  Measurements: 4.3 x 2.9 x 3.2 cm. Normal appearance/no adnexal mass.  Left ovary  Measurements: 4.3 x 2.4 x 4 cm. There appears to be an involuting hemorrhagic cyst measuring 3 x 1 x 2.3 cm. No significant  internal flow demonstrated. No abnormal adnexal masses demonstrated.  Other findings  Small amount of free fluid is demonstrated in the cul-de-sac. Fluid is somewhat echogenic, suggesting hemorrhagic fluid. This is likely physiologic.  IMPRESSION: Normal ultrasound appearance of the uterus and right ovary. Left ovary demonstrates what appears to be an involuting hemorrhagic cyst. Small amount of free fluid in the cul-de-sac. Findings are likely physiologic.   Electronically Signed   By: Burman NievesWilliam  Stevens M.D.   On: 12/08/2014 22:29     EKG Interpretation None       Today's Vitals   12/08/14 2107 12/08/14 2130 12/08/14 2230 12/08/14 2327  BP: 143/87 136/80 139/82 138/85  Pulse: 94 93 97 96  Temp:    99.4 F (37.4 C)  TempSrc:    Oral  Resp: 17   16  SpO2: 100% 100% 100% 100%  PainSc:    7    11:00 PM Discussed ultrasound results with the patient.  Patient reports that her pain has significantly improved at this time.  Abdomen soft with mild suprapubic tenderness to palpation.  No rebound  or guarding.  No tenderness of McBurney's point.   MDM   Final diagnoses:  None   Patient presents today with suprapubic abdominal pain x 3 days.  Patient also found to have an oral temp of 100 F upon arrival in the ED.  Labs showing leukocytosis with a WBC of 14.1.  Patient also with Creatine of 1.29, however, this appears to be baseline.  Patient initially tachycardic, but this resolved after pain medication and IVF.  Urine pregnancy negative.  Urine showing small leukocytes, but only rare bacteria.  Urine cultured.  Patient not having urinary symptoms.  Abdomen soft without rebound or guarding.  No pain over McBurney's point.  Negative Murphy's sign.  On pelvic exam, patient with CMT and also yellowish colored discharge in the vaginal vault.  Suspect PID.  GC/Chlamydia pending.  Wet prep showing few clue cells, but many WBC.  Patient given treatment with Rocephin in the ED and also given Rx for Flagyl and Doxycycline.  Patient instructed to follow up with her GYN.  Ultrasound showing possible involuting hemorrhagic cyst with small amount of free fluid in the cul-de-sac thought to be physiologic.  Feel that the patient is stable for discharge.  Return precautions given.      Santiago GladHeather Darion Juhasz, PA-C 12/09/14 0102  Jerelyn ScottMartha Linker, MD 12/09/14 [phone]

## 2014-12-08 NOTE — Discharge Instructions (Signed)
Take antibiotics as prescribed.  Do not drink alcohol while taking Flagyl.  Follow up with your OB/GYN.  Make sure your partner wears protection during sex.  Return to the Emergency Department if symptoms change or worsen.  Gonorrhea and Chlamydia is pending and should come back in 2 days.  You will be called if positive.

## 2014-12-09 LAB — GC/CHLAMYDIA PROBE AMP (~~LOC~~) NOT AT ARMC
Chlamydia: NEGATIVE
Neisseria Gonorrhea: POSITIVE — AB

## 2014-12-09 LAB — HIV ANTIBODY (ROUTINE TESTING W REFLEX): HIV Screen 4th Generation wRfx: NONREACTIVE

## 2014-12-10 ENCOUNTER — Telehealth (HOSPITAL_BASED_OUTPATIENT_CLINIC_OR_DEPARTMENT_OTHER): Payer: Self-pay | Admitting: Emergency Medicine

## 2014-12-10 LAB — URINE CULTURE: Colony Count: >=100000

## 2015-08-30 ENCOUNTER — Encounter: Payer: Self-pay | Admitting: Internal Medicine

## 2015-12-20 ENCOUNTER — Encounter (HOSPITAL_COMMUNITY): Payer: Self-pay | Admitting: *Deleted

## 2015-12-20 ENCOUNTER — Inpatient Hospital Stay (HOSPITAL_COMMUNITY)
Admission: AD | Admit: 2015-12-20 | Discharge: 2015-12-20 | Disposition: A | Discharge status: Home / Self Care | Payer: Medicaid Other | Source: Ambulatory Visit | Attending: Family Medicine | Admitting: Family Medicine

## 2015-12-20 DIAGNOSIS — Z3A10 10 weeks gestation of pregnancy: Secondary | ICD-10-CM | POA: Diagnosis not present

## 2015-12-20 DIAGNOSIS — O210 Mild hyperemesis gravidarum: Secondary | ICD-10-CM | POA: Insufficient documentation

## 2015-12-20 DIAGNOSIS — O10911 Unspecified pre-existing hypertension complicating pregnancy, first trimester: Secondary | ICD-10-CM | POA: Diagnosis not present

## 2015-12-20 DIAGNOSIS — O219 Vomiting of pregnancy, unspecified: Secondary | ICD-10-CM

## 2015-12-20 LAB — URINALYSIS, ROUTINE W REFLEX MICROSCOPIC
Bilirubin Urine: NEGATIVE
Glucose, UA: NEGATIVE mg/dL
Hgb urine dipstick: NEGATIVE
Ketones, ur: >80 mg/dL — AB
Leukocytes, UA: NEGATIVE
Nitrite: NEGATIVE
Protein, ur: 30 mg/dL — AB
Specific Gravity, Urine: >1.03 — ABNORMAL HIGH (ref 1.005–1.030)
pH: 5.5 (ref 5.0–8.0)

## 2015-12-20 LAB — URINE MICROSCOPIC-ADD ON
Bacteria, UA: NONE SEEN
RBC / HPF: NONE SEEN RBC/hpf (ref 0–5)

## 2015-12-20 MED ORDER — ONDANSETRON HCL 4 MG PO TABS: 4.0000 mg | ORAL_TABLET | Freq: Three times a day (TID) | ORAL | Status: DC | PRN

## 2015-12-20 MED ORDER — DEXTROSE 5 % IN LACTATED RINGERS IV BOLUS
1000.0000 mL | Freq: Once | INTRAVENOUS | Status: DC
Start: 2015-12-20 — End: 2015-12-20

## 2015-12-20 MED ORDER — PROMETHAZINE HCL 25 MG/ML IJ SOLN: 12.5000 mg | Freq: Four times a day (QID) | INTRAMUSCULAR | Status: DC | PRN

## 2015-12-20 NOTE — Discharge Instructions (Signed)

## 2015-12-20 NOTE — MAU Note (Signed)
Started feeling nauseous yesterday evening and has been vomiting throughout the night.  Denies any LOF/VB.  Last emesis occurrence was 1345.

## 2015-12-20 NOTE — MAU Provider Note (Signed)
History     CSN: [id number]  Arrival date and time: 12/20/15 1432   First Provider Initiated Contact with Patient 12/20/15 1455      Chief Complaint  Patient presents with  . Nausea  . Emesis   HPI  Patty Gilmore is a 25 yo G1P0 at [redacted]w[redacted]d who presents with complaints of nausea and vomiting x 1 day. Last night she had non-bloody non-bilious emesis q 1 hour, has vomited twice today. Is tolerating PO currently. Denies dizziness, fever, chills, heartburn, abdominal pain, diarrhea, constipation, or dysuria.  No LOF, vaginal bleeding, vaginal discharge. No complications with current pregnancy. Has first prenatal appointment on 3/23.    Past Medical History  Diagnosis Date  . Hypertension     Past Surgical History  Procedure Laterality Date  . No past surgeries      Family History  Problem Relation Age of Onset  . Diabetes Maternal Grandmother   . Diabetes Paternal Grandmother     Social History  Substance Use Topics  . Smoking status: Never Smoker   . Smokeless tobacco: None  . Alcohol Use: Yes     Comment: Rarely (less than once per month)    Allergies: No Known Allergies  Facility-administered medications prior to admission  Medication Dose Route Frequency Provider Last Rate Last Dose  . Tdap (BOOSTRIX) injection 0.5 mL  0.5 mL Intramuscular Once Tonye Pearson, MD       Prescriptions prior to admission  Medication Sig Dispense Refill Last Dose  . acetaminophen (TYLENOL) 325 MG tablet Take 650 mg by mouth every 6 (six) hours as needed for headache.   Past Week at Unknown time  . Prenatal Vit-Fe Fumarate-FA (PRENATAL MULTIVITAMIN) TABS tablet Take 1 tablet by mouth daily at 12 noon.   12/20/2015 at Unknown time  . acetaZOLAMIDE (DIAMOX SEQUELS) 500 MG capsule Take 1 capsule (500 mg total) by mouth 2 (two) times daily. (Patient not taking: Reported on 12/20/2015) 60 capsule 3   . amLODipine (NORVASC) 5 MG tablet Take 1 tablet (5 mg total) by mouth daily. (Patient  not taking: Reported on 12/20/2015) 90 tablet 3 Taking  . doxycycline (VIBRAMYCIN) 100 MG capsule Take 1 capsule (100 mg total) by mouth 2 (two) times daily. (Patient not taking: Reported on 12/20/2015) 28 capsule 0   . HYDROcodone-acetaminophen (NORCO/VICODIN) 5-325 MG per tablet Take 1-2 tablets by mouth every 6 (six) hours as needed. (Patient not taking: Reported on 12/20/2015) 15 tablet 0 Not Taking at Unknown time  . metroNIDAZOLE (FLAGYL) 500 MG tablet Take 1 tablet (500 mg total) by mouth 2 (two) times daily. (Patient not taking: Reported on 12/20/2015) 28 tablet 0     ROS: see HPI Physical Exam   Blood pressure 140/87, pulse 88, temperature 97.7 F (36.5 C), temperature source Oral, resp. rate 16, last menstrual period 10/11/2015.  Physical Exam  Constitutional: She is oriented to person, place, and time. She appears well-developed and well-nourished.  HENT:  Head: Normocephalic and atraumatic.  Eyes: No scleral icterus.  Cardiovascular: Normal rate, regular rhythm and normal heart sounds.  Exam reveals no gallop and no friction rub.   No murmur heard. Respiratory: Effort normal.  GI: Soft. She exhibits no distension and no mass. There is no tenderness. There is no rebound and no guarding.  Neurological: She is alert and oriented to person, place, and time.  Skin: Skin is warm and dry.    MAU Course  Procedures  MDM Patient presented to MAU with stable vital  signs and no episodes of nausea/emesis. Physical exam reveals moist mucous membranes, no skin tenting, and a non tender abdomen. No signs of severe dehydration or acute abdomen on physical exam, however, her UA did reveal signs of dehydration (>80 ketones). While in MAU, patient drank 3 juices and 2 cups of water without difficulty. Did not start IVF due to excellent PO intake.   Assessment and Plan  Patty Gilmore is a 25 yo G1P0 at 1743w0d who presents with complaints of nausea and vomiting x 1 day.   Transient  Nausea/Emesis: Likely normal symptoms from 1st trimester pregnancy - PO hydration - Return precautions given - Zofran PRN - Informational handout on nausea/emesis in pregnancy given - Will follow up with OB for 1st prenatal visit on 3/23.    Patient seen by myself and Lucretia KernJohn Diehl, MS3  Beaulah Dinninghristina M Gambino, MD 12/20/2015, 4:15 PM

## 2015-12-23 ENCOUNTER — Other Ambulatory Visit (HOSPITAL_COMMUNITY): Payer: Self-pay | Admitting: Physician Assistant

## 2015-12-23 DIAGNOSIS — Z3A13 13 weeks gestation of pregnancy: Secondary | ICD-10-CM

## 2015-12-23 DIAGNOSIS — Z3682 Encounter for antenatal screening for nuchal translucency: Secondary | ICD-10-CM

## 2015-12-23 LAB — OB RESULTS CONSOLE HEPATITIS B SURFACE ANTIGEN: Hepatitis B Surface Ag: NEGATIVE

## 2015-12-23 LAB — CYTOLOGY - PAP

## 2015-12-23 LAB — OB RESULTS CONSOLE HIV ANTIBODY (ROUTINE TESTING): HIV: NONREACTIVE

## 2015-12-23 LAB — OB RESULTS CONSOLE GC/CHLAMYDIA
Chlamydia: NEGATIVE
Gonorrhea: NEGATIVE

## 2015-12-23 LAB — OB RESULTS CONSOLE HGB/HCT, BLOOD
HCT: 30 %
Hemoglobin: 10.2 g/dL

## 2015-12-23 LAB — OB RESULTS CONSOLE ABO/RH: RH Type: POSITIVE

## 2015-12-23 LAB — OB RESULTS CONSOLE PLATELET COUNT: Platelets: 320 10*3/uL

## 2015-12-23 LAB — OB RESULTS CONSOLE RUBELLA ANTIBODY, IGM: Rubella: IMMUNE

## 2015-12-23 LAB — OB RESULTS CONSOLE RPR: RPR: NONREACTIVE

## 2015-12-23 LAB — OB RESULTS CONSOLE VARICELLA ZOSTER ANTIBODY, IGG: Varicella: UNDETERMINED

## 2015-12-23 LAB — OB RESULTS CONSOLE ANTIBODY SCREEN: Antibody Screen: NEGATIVE

## 2016-01-07 ENCOUNTER — Encounter (HOSPITAL_COMMUNITY): Payer: Self-pay

## 2016-01-07 ENCOUNTER — Ambulatory Visit (HOSPITAL_COMMUNITY)
Admission: RE | Admit: 2016-01-07 | Discharge: 2016-01-07 | Disposition: A | Discharge status: Home / Self Care | Payer: Medicaid Other | Source: Ambulatory Visit | Attending: Physician Assistant | Admitting: Physician Assistant

## 2016-01-07 DIAGNOSIS — Z3682 Encounter for antenatal screening for nuchal translucency: Secondary | ICD-10-CM

## 2016-01-07 DIAGNOSIS — Z36 Encounter for antenatal screening of mother: Secondary | ICD-10-CM | POA: Diagnosis not present

## 2016-01-07 DIAGNOSIS — O10011 Pre-existing essential hypertension complicating pregnancy, first trimester: Secondary | ICD-10-CM | POA: Diagnosis present

## 2016-01-07 DIAGNOSIS — Z3A12 12 weeks gestation of pregnancy: Secondary | ICD-10-CM | POA: Diagnosis not present

## 2016-01-07 DIAGNOSIS — Z3A13 13 weeks gestation of pregnancy: Secondary | ICD-10-CM

## 2016-01-12 ENCOUNTER — Encounter: Payer: Self-pay | Admitting: *Deleted

## 2016-01-12 DIAGNOSIS — O10919 Unspecified pre-existing hypertension complicating pregnancy, unspecified trimester: Secondary | ICD-10-CM

## 2016-01-12 DIAGNOSIS — O0990 Supervision of high risk pregnancy, unspecified, unspecified trimester: Secondary | ICD-10-CM | POA: Insufficient documentation

## 2016-01-13 ENCOUNTER — Ambulatory Visit (INDEPENDENT_AMBULATORY_CARE_PROVIDER_SITE_OTHER): Payer: Medicaid Other | Admitting: Family Medicine

## 2016-01-13 ENCOUNTER — Encounter: Payer: Self-pay | Admitting: Family Medicine

## 2016-01-13 VITALS — BP 140/83 | HR 66 | Wt 212.6 lb

## 2016-01-13 DIAGNOSIS — E669 Obesity, unspecified: Secondary | ICD-10-CM | POA: Diagnosis not present

## 2016-01-13 DIAGNOSIS — O10912 Unspecified pre-existing hypertension complicating pregnancy, second trimester: Secondary | ICD-10-CM | POA: Diagnosis not present

## 2016-01-13 DIAGNOSIS — Z23 Encounter for immunization: Secondary | ICD-10-CM

## 2016-01-13 DIAGNOSIS — O0992 Supervision of high risk pregnancy, unspecified, second trimester: Secondary | ICD-10-CM

## 2016-01-13 DIAGNOSIS — R87622 Low grade squamous intraepithelial lesion on cytologic smear of vagina (LGSIL): Secondary | ICD-10-CM | POA: Insufficient documentation

## 2016-01-13 DIAGNOSIS — G932 Benign intracranial hypertension: Secondary | ICD-10-CM

## 2016-01-13 DIAGNOSIS — O99213 Obesity complicating pregnancy, third trimester: Secondary | ICD-10-CM

## 2016-01-13 LAB — POCT URINALYSIS DIP (DEVICE)
Bilirubin Urine: NEGATIVE
Glucose, UA: NEGATIVE mg/dL
Hgb urine dipstick: NEGATIVE
Ketones, ur: NEGATIVE mg/dL
Leukocytes, UA: NEGATIVE
Nitrite: NEGATIVE
Protein, ur: NEGATIVE mg/dL
Specific Gravity, Urine: 1.02 (ref 1.005–1.030)
Urobilinogen, UA: 0.2 mg/dL (ref 0.0–1.0)
pH: 6 (ref 5.0–8.0)

## 2016-01-13 NOTE — Progress Notes (Signed)
Has not taken her labetalol today.

## 2016-01-13 NOTE — Progress Notes (Signed)
Subjective:  Patty Gilmore is a 25 y.o. G1P0 at 6072w3d being seen today for initial prenatal care.  She is currently monitored for the following issues for this high-risk pregnancy and has Acanthosis nigricans; LVH (left ventricular hypertrophy); HTN (hypertension); Papilledema; Severe obesity (BMI >= 40) (HCC); Pseudotumor cerebri; Supervision of high risk pregnancy, antepartum; HTN in pregnancy, chronic; and LGSIL Pap smear of vagina on her problem list.  Patient reports no complaints.  Contractions: Not present. Vag. Bleeding: None.   . Denies leaking of fluid.   The following portions of the patient's history were reviewed and updated as appropriate: allergies, current medications, past family history, past medical history, past social history, past surgical history and problem list. Problem list updated.  Objective:   Filed Vitals:   01/13/16 0811  BP: 140/83  Pulse: 66  Weight: 212 lb 9.6 oz (96.435 kg)    Fetal Status: Fetal Heart Rate (bpm): 153         General:  Alert, oriented and cooperative. Patient is in no acute distress.  Skin: Skin is warm and dry. No rash noted.   Cardiovascular: Normal heart rate noted  Respiratory: Normal respiratory effort, no problems with respiration noted  Abdomen: Soft, gravid, appropriate for gestational age. Pain/Pressure: Absent     Pelvic: Vag. Bleeding: None     Cervical exam deferred        Extremities: Normal range of motion.  Edema: None  Mental Status: Normal mood and affect. Normal behavior. Normal judgment and thought content.   Urinalysis:      Assessment and Plan:  Pregnancy: G1P0 at 1272w3d  1. Supervision of high risk pregnancy, antepartum, second trimester FHT normal.  Schedule Anatomy scan.  Will need to get remainder of PNL.  MSAFP at next visit - US MFM OB DETAIL +14 WK; Future - Flu Vaccine QUAD 36+ mos IM; Standing - Flu Vaccine QUAD 36+ mos IM  2. Pseudotumor cerebri Recommended that she call her neurologist and  inform them that she is pregnant.  3. HTN in pregnancy, chronic, second trimester Controlled on Labetalol.  Recommended that she remember to take it.   24hr urine - US MFM OB DETAIL +14 WK; Future - Flu Vaccine QUAD 36+ mos IM; Standing - Flu Vaccine QUAD 36+ mos IM  4. LGSIL Pap smear of vagina Schedule colposcopy  Preterm labor symptoms and general obstetric precautions including but not limited to vaginal bleeding, contractions, leaking of fluid and fetal movement were reviewed in detail with the patient. Please refer to After Visit Summary for other counseling recommendations.  Return in about 4 weeks (around 02/10/2016) for HR OB f/u.   Levie HeritageJacob J Jiovany Scheffel, DO

## 2016-01-14 ENCOUNTER — Other Ambulatory Visit (HOSPITAL_COMMUNITY): Payer: Self-pay

## 2016-01-17 ENCOUNTER — Other Ambulatory Visit: Payer: Medicaid Other

## 2016-01-17 ENCOUNTER — Encounter: Payer: Self-pay | Admitting: *Deleted

## 2016-01-17 DIAGNOSIS — O161 Unspecified maternal hypertension, first trimester: Secondary | ICD-10-CM

## 2016-01-17 LAB — COMPREHENSIVE METABOLIC PANEL
ALT: 23 U/L (ref 6–29)
AST: 23 U/L (ref 10–30)
Albumin: 3.7 g/dL (ref 3.6–5.1)
Alkaline Phosphatase: 57 U/L (ref 33–115)
BUN: 6 mg/dL — ABNORMAL LOW (ref 7–25)
CO2: 19 mmol/L — ABNORMAL LOW (ref 20–31)
Calcium: 9 mg/dL (ref 8.6–10.2)
Chloride: 107 mmol/L (ref 98–110)
Creat: 0.96 mg/dL (ref 0.50–1.10)
Glucose, Bld: 75 mg/dL (ref 65–99)
Potassium: 4.4 mmol/L (ref 3.5–5.3)
Sodium: 136 mmol/L (ref 135–146)
Total Bilirubin: 0.3 mg/dL (ref 0.2–1.2)
Total Protein: 6.5 g/dL (ref 6.1–8.1)

## 2016-01-18 LAB — PROTEIN, URINE, 24 HOUR
Protein, 24H Urine: 121 mg/24 h (ref <150)
Protein, Urine: 11 mg/dL (ref 5–24)

## 2016-02-10 ENCOUNTER — Ambulatory Visit (INDEPENDENT_AMBULATORY_CARE_PROVIDER_SITE_OTHER): Payer: Medicaid Other | Admitting: Obstetrics & Gynecology

## 2016-02-10 VITALS — BP 141/90

## 2016-02-10 DIAGNOSIS — E669 Obesity, unspecified: Secondary | ICD-10-CM

## 2016-02-10 DIAGNOSIS — O99212 Obesity complicating pregnancy, second trimester: Secondary | ICD-10-CM

## 2016-02-10 DIAGNOSIS — O10912 Unspecified pre-existing hypertension complicating pregnancy, second trimester: Secondary | ICD-10-CM | POA: Diagnosis not present

## 2016-02-10 DIAGNOSIS — O0992 Supervision of high risk pregnancy, unspecified, second trimester: Secondary | ICD-10-CM

## 2016-02-10 LAB — POCT URINALYSIS DIP (DEVICE)
Glucose, UA: NEGATIVE mg/dL
Hgb urine dipstick: NEGATIVE
Leukocytes, UA: NEGATIVE
Nitrite: NEGATIVE
Protein, ur: 30 mg/dL — AB
Specific Gravity, Urine: >=1.03 (ref 1.005–1.030)
Urobilinogen, UA: 1 mg/dL (ref 0.0–1.0)
pH: 6 (ref 5.0–8.0)

## 2016-02-10 MED ORDER — LABETALOL HCL 200 MG PO TABS: 200.0000 mg | ORAL_TABLET | Freq: Two times a day (BID) | ORAL | Status: DC

## 2016-02-10 MED ORDER — ASPIRIN EC 81 MG PO TBEC: 81.0000 mg | DELAYED_RELEASE_TABLET | Freq: Every day | ORAL | Status: DC

## 2016-02-10 NOTE — Progress Notes (Signed)
Subjective:US in 11 days, dis not take labetalol today  Patty Gilmore is a 25 y.o. G1P0 at 8974w3d being seen today for ongoing prenatal care.  She is currently monitored for the following issues for this high-risk pregnancy and has Acanthosis nigricans; LVH (left ventricular hypertrophy); HTN (hypertension); Papilledema; Severe obesity (BMI >= 40) (HCC); Pseudotumor cerebri; Supervision of high risk pregnancy, antepartum; HTN in pregnancy, chronic; and LGSIL Pap smear of vagina on her problem list.  Patient reports no complaints.  Contractions: Not present.  .   . Denies leaking of fluid.   The following portions of the patient's history were reviewed and updated as appropriate: allergies, current medications, past family history, past medical history, past social history, past surgical history and problem list. Problem list updated.  Objective:   Filed Vitals:   02/10/16 1128  BP: 141/90    Fetal Status: Fetal Heart Rate (bpm): 148 Fundal Height: 18 cm       General:  Alert, oriented and cooperative. Patient is in no acute distress.  Skin: Skin is warm and dry. No rash noted.   Cardiovascular: Normal heart rate noted  Respiratory: Normal respiratory effort, no problems with respiration noted  Abdomen: Soft, gravid, appropriate for gestational age. Pain/Pressure: Present     Pelvic:       Cervical exam deferred        Extremities: Normal range of motion.     Mental Status: Normal mood and affect. Normal behavior. Normal judgment and thought content.   Urinalysis: Urine Protein: 1+ Urine Glucose: Negative  Assessment and Plan:  Pregnancy: G1P0 at 6474w3d  1. Supervision of high risk pregnancy, antepartum, second trimester Rx sent to Rx - labetalol (NORMODYNE) 200 MG tablet; Take 1 tablet (200 mg total) by mouth 2 (two) times daily.  Dispense: 60 tablet; Refill: 3 - aspirin EC 81 MG tablet; Take 1 tablet (81 mg total) by mouth daily.  Dispense: 30 tablet; Refill: 5 - Alpha  fetoprotein, maternal  2. HTN in pregnancy, chronic, second trimester  - labetalol (NORMODYNE) 200 MG tablet; Take 1 tablet (200 mg total) by mouth 2 (two) times daily.  Dispense: 60 tablet; Refill: 3 - aspirin EC 81 MG tablet; Take 1 tablet (81 mg total) by mouth daily.  Dispense: 30 tablet; Refill: 5  Preterm labor symptoms and general obstetric precautions including but not limited to vaginal bleeding, contractions, leaking of fluid and fetal movement were reviewed in detail with the patient. Please refer to After Visit Summary for other counseling recommendations.  Return in about 2 weeks (around 02/24/2016).   Adam PhenixJames G Arnold, MD

## 2016-02-12 ENCOUNTER — Inpatient Hospital Stay (HOSPITAL_COMMUNITY)
Admission: AD | Admit: 2016-02-12 | Discharge: 2016-02-13 | Disposition: A | Discharge status: Home / Self Care | Payer: Medicaid Other | Source: Ambulatory Visit | Attending: Obstetrics & Gynecology | Admitting: Obstetrics & Gynecology

## 2016-02-12 DIAGNOSIS — G932 Benign intracranial hypertension: Secondary | ICD-10-CM | POA: Insufficient documentation

## 2016-02-12 DIAGNOSIS — Z3A17 17 weeks gestation of pregnancy: Secondary | ICD-10-CM | POA: Insufficient documentation

## 2016-02-12 DIAGNOSIS — Z8349 Family history of other endocrine, nutritional and metabolic diseases: Secondary | ICD-10-CM | POA: Insufficient documentation

## 2016-02-12 DIAGNOSIS — Z9889 Other specified postprocedural states: Secondary | ICD-10-CM | POA: Insufficient documentation

## 2016-02-12 DIAGNOSIS — Z7982 Long term (current) use of aspirin: Secondary | ICD-10-CM | POA: Insufficient documentation

## 2016-02-12 DIAGNOSIS — O10912 Unspecified pre-existing hypertension complicating pregnancy, second trimester: Secondary | ICD-10-CM

## 2016-02-12 DIAGNOSIS — Z79899 Other long term (current) drug therapy: Secondary | ICD-10-CM | POA: Insufficient documentation

## 2016-02-12 DIAGNOSIS — G43009 Migraine without aura, not intractable, without status migrainosus: Secondary | ICD-10-CM

## 2016-02-12 DIAGNOSIS — G43909 Migraine, unspecified, not intractable, without status migrainosus: Secondary | ICD-10-CM | POA: Insufficient documentation

## 2016-02-12 DIAGNOSIS — O26892 Other specified pregnancy related conditions, second trimester: Secondary | ICD-10-CM | POA: Insufficient documentation

## 2016-02-12 DIAGNOSIS — O10919 Unspecified pre-existing hypertension complicating pregnancy, unspecified trimester: Secondary | ICD-10-CM | POA: Insufficient documentation

## 2016-02-12 DIAGNOSIS — Z833 Family history of diabetes mellitus: Secondary | ICD-10-CM | POA: Insufficient documentation

## 2016-02-12 NOTE — MAU Note (Signed)
Pt presents complaining of a headache since Thursday. Tried tyelnol but does not help. Last dose at 2045. Denies leaking or vaginal bleeding. Denies discharge. Denies visual changes.

## 2016-02-13 ENCOUNTER — Encounter (HOSPITAL_COMMUNITY): Payer: Self-pay

## 2016-02-13 DIAGNOSIS — Z79899 Other long term (current) drug therapy: Secondary | ICD-10-CM | POA: Diagnosis not present

## 2016-02-13 DIAGNOSIS — G43009 Migraine without aura, not intractable, without status migrainosus: Secondary | ICD-10-CM | POA: Diagnosis not present

## 2016-02-13 DIAGNOSIS — Z833 Family history of diabetes mellitus: Secondary | ICD-10-CM | POA: Diagnosis not present

## 2016-02-13 DIAGNOSIS — G932 Benign intracranial hypertension: Secondary | ICD-10-CM | POA: Diagnosis not present

## 2016-02-13 DIAGNOSIS — Z3A17 17 weeks gestation of pregnancy: Secondary | ICD-10-CM | POA: Diagnosis not present

## 2016-02-13 DIAGNOSIS — G43909 Migraine, unspecified, not intractable, without status migrainosus: Secondary | ICD-10-CM | POA: Diagnosis not present

## 2016-02-13 DIAGNOSIS — O10919 Unspecified pre-existing hypertension complicating pregnancy, unspecified trimester: Secondary | ICD-10-CM | POA: Diagnosis not present

## 2016-02-13 DIAGNOSIS — O9989 Other specified diseases and conditions complicating pregnancy, childbirth and the puerperium: Secondary | ICD-10-CM

## 2016-02-13 DIAGNOSIS — Z9889 Other specified postprocedural states: Secondary | ICD-10-CM | POA: Diagnosis not present

## 2016-02-13 DIAGNOSIS — O26892 Other specified pregnancy related conditions, second trimester: Secondary | ICD-10-CM | POA: Diagnosis present

## 2016-02-13 DIAGNOSIS — Z7982 Long term (current) use of aspirin: Secondary | ICD-10-CM | POA: Diagnosis not present

## 2016-02-13 DIAGNOSIS — Z8349 Family history of other endocrine, nutritional and metabolic diseases: Secondary | ICD-10-CM | POA: Diagnosis not present

## 2016-02-13 LAB — URINALYSIS, ROUTINE W REFLEX MICROSCOPIC
Bilirubin Urine: NEGATIVE
Glucose, UA: NEGATIVE mg/dL
Hgb urine dipstick: NEGATIVE
Ketones, ur: NEGATIVE mg/dL
Leukocytes, UA: NEGATIVE
Nitrite: NEGATIVE
Protein, ur: NEGATIVE mg/dL
Specific Gravity, Urine: >1.03 — ABNORMAL HIGH (ref 1.005–1.030)
pH: 6 (ref 5.0–8.0)

## 2016-02-13 MED ORDER — PROCHLORPERAZINE MALEATE 5 MG PO TABS
5.0000 mg | ORAL_TABLET | Freq: Once | ORAL | Status: AC
Start: 2016-02-13 — End: 2016-02-13
  Administered 2016-02-13: 5 mg via ORAL
  Filled 2016-02-13: qty 1

## 2016-02-13 MED ORDER — METOCLOPRAMIDE HCL 10 MG PO TABS
10.0000 mg | ORAL_TABLET | Freq: Once | ORAL | Status: AC
  Administered 2016-02-13: 10 mg via ORAL
  Filled 2016-02-13: qty 1

## 2016-02-13 NOTE — MAU Provider Note (Signed)
Chief Complaint:  Headache   Provider saw patient at 0100 hrs   HPI  Patty Gilmore is a 25 y.o. G1P0 at 5717w6dwho presents to maternity admissions reporting headache since Thursday.  States it is typical of usual migraines.  Prior to pregnancy, used Excedrin for them.  Has not had success with plain Tylenol.  Denies visual changes,  Numbness or weakness anywhere. She reports good fetal movement, denies LOF, vaginal bleeding, vaginal itching/burning, urinary symptoms, h/a, dizziness, n/v, diarrhea, constipation or fever/chills.  She denies headache, visual changes or RUQ abdominal pain.  History is remarkable for pseudotumor cerebrii.  Has not seen neurologist in quite a while. Does also have chronic hypertension for which she takes Labetalol.  RN Note: Pt presents complaining of a headache since Thursday. Tried tyelnol but does not help. Last dose at 2045. Denies leaking or vaginal bleeding. Denies discharge. Denies visual changes.          Past Medical History: Past Medical History  Diagnosis Date  . 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 Hypertension     was taking amlodipine stopped about 1 yr ago - no insurance - had been on meds since age 25  Pseudotumor Cerebrii  Past obstetric history: OB History  Gravida Para Term Preterm AB SAB TAB Ectopic Multiple Living  1             # Outcome Date GA Lbr Len/2nd Weight Sex Delivery Anes PTL Lv  1 Current               Past Surgical History: Past Surgical History  Procedure Laterality Date  . No past surgeries    . Lumbar puncture  07/20/2014    fluid build up around spinal column    Family History: Family History  Problem Relation Age of Onset  . Diabetes Maternal Grandmother   . Diabetes Paternal Grandmother   . Thyroid disease Mother     Social History: Social History  Substance Use Topics  . Smoking status: Never Smoker   . Smokeless tobacco: None  .  Alcohol Use: No     Comment: Rarely (less than once per month)    Allergies: No Known Allergies  Meds:  Facility-administered medications prior to admission  Medication Dose Route Frequency Provider Last Rate Last Dose  . Tdap (BOOSTRIX) injection 0.5 mL  0.5 mL Intramuscular Once Tonye Pearsonobert P Doolittle, MD       Prescriptions prior to admission  Medication Sig Dispense Refill Last Dose  . acetaminophen (TYLENOL) 325 MG tablet Take 650 mg by mouth every 6 (six) hours as needed for headache. Reported on 01/13/2016   Not Taking  . aspirin EC 81 MG tablet Take 1 tablet (81 mg total) by mouth daily. 30 tablet 5   . labetalol (NORMODYNE) 200 MG tablet Take 1 tablet (200 mg total) by mouth 2 (two) times daily. 60 tablet 3   . ondansetron (ZOFRAN) 4 MG tablet Take 1 tablet (4 mg total) by mouth every 8 (eight) hours as needed for nausea or vomiting. (Patient not taking: Reported on 01/07/2016) 20 tablet 0 Not Taking  . Prenatal Vit-Fe Fumarate-FA (PRENATAL MULTIVITAMIN) TABS tablet Take 1 tablet by mouth daily at 12 noon.   Taking    I have reviewed patient's Past Medical Hx, Surgical Hx, Family Hx, Social Hx, medications and allergies.   ROS:  Review of Systems  Constitutional: Negative for fever and chills.  HENT: Negative for congestion, ear pain, rhinorrhea and sore throat.   Eyes: Negative for photophobia and visual disturbance.  Respiratory: Negative for shortness of breath.   Gastrointestinal: Negative for nausea, vomiting, abdominal pain, diarrhea and constipation.  Genitourinary: Negative for vaginal bleeding, vaginal discharge and pelvic pain.  Musculoskeletal: Negative for back pain.  Neurological: Positive for headaches. Negative for dizziness, tremors, seizures, syncope, facial asymmetry, speech difficulty, weakness, light-headedness and numbness.  Psychiatric/Behavioral: Negative for agitation. The patient is not nervous/anxious.    Other systems negative  Physical Exam  Patient  Vitals for the past 24 hrs:  BP Temp Temp src Pulse Resp  02/12/16 2349 135/88 mmHg 98 F (36.7 C) Oral 70 16   Constitutional: Well-developed, well-nourished female in no acute distress.  Cardiovascular: normal rate and rhythm Respiratory: normal effort, clear to auscultation bilaterally GI: Abd soft, non-tender, gravid appropriate for gestational age.   No rebound or guarding. MS: Extremities nontender, no edema, normal ROM Neurologic: Alert and oriented x 4. Grossly normal.  No facial assymetry. No weakness. MAEW x 4.  Normal speech. GU: Neg CVAT.  FHT 145  Labs: Results for orders placed or performed during the hospital encounter of 02/12/16 (from the past 24 hour(s))  Urinalysis, Routine w reflex microscopic (not at [facility])     Status: Abnormal   Collection Time: 02/13/16 12:37 AM  Result Value Ref Range   Color, Urine YELLOW YELLOW   APPearance CLEAR CLEAR   Specific Gravity, Urine >1.030 (H) 1.005 - 1.030   pH 6.0 5.0 - 8.0   Glucose, UA NEGATIVE NEGATIVE mg/dL   Hgb urine dipstick NEGATIVE NEGATIVE   Bilirubin Urine NEGATIVE NEGATIVE   Ketones, ur NEGATIVE NEGATIVE mg/dL   Protein, ur NEGATIVE NEGATIVE mg/dL   Nitrite NEGATIVE NEGATIVE   Leukocytes, UA NEGATIVE NEGATIVE   O/Positive/-- (03/23 0000)  Imaging:  No results found.  MAU Course/MDM: I have ordered labs and reviewed results.   Consult Dr Erin Fulling with presentation, exam findings and test results.  She recommends trying usual migraine meds. And if they improve headache, may discharge home Treatments in MAU included Compazine and Reglan PO with excellent relief of pain from 7 to a 3..    Assessment: 1. Supervision of high risk pregnancy, antepartum, second trimester   2. HTN in pregnancy, chronic, second trimester   3.    Migraine headache  Plan: Discharge home If headache returns or if she develops other symptoms, return immediately Also recommend she make an appt this week for baseline recheck  by her neurologist Follow up in Office for prenatal visits and recheck    Medication List    ASK your doctor about these medications        acetaminophen 325 MG tablet  Commonly known as:  TYLENOL  Take 650 mg by mouth every 6 (six) hours as needed for headache. Reported on 01/13/2016     aspirin EC 81 MG tablet  Take 1 tablet (81 mg total) by mouth daily.     labetalol 200 MG tablet  Commonly known as:  NORMODYNE  Take 1 tablet (200 mg total) by mouth 2 (two) times daily.     ondansetron 4 MG tablet  Commonly known as:  ZOFRAN  Take 1 tablet (4 mg total) by mouth every 8 (eight) hours as needed for nausea or vomiting.     prenatal multivitamin Tabs tablet  Take 1 tablet by mouth daily at 12 noon.       Pt stable at  time of discharge.  Encouraged to return here or to other Urgent Care/ED if she develops worsening of symptoms, increase in pain, fever, or other concerning symptoms.      Wynelle Bourgeois CNM, MSN Certified Nurse-Midwife 02/13/2016 1:10 AM

## 2016-02-13 NOTE — Discharge Instructions (Signed)

## 2016-02-16 LAB — ALPHA FETOPROTEIN, MATERNAL
AFP: 33.2 ng/mL
Curr Gest Age: 17.4 weeks
MoM for AFP: 0.91
Open Spina bifida: NEGATIVE
Osb Risk: 1:36900 {titer}

## 2016-02-21 ENCOUNTER — Ambulatory Visit (HOSPITAL_COMMUNITY)
Admission: RE | Admit: 2016-02-21 | Discharge: 2016-02-21 | Disposition: A | Discharge status: Home / Self Care | Payer: Medicaid Other | Source: Ambulatory Visit | Attending: Family Medicine | Admitting: Family Medicine

## 2016-02-21 ENCOUNTER — Other Ambulatory Visit: Payer: Self-pay | Admitting: General Practice

## 2016-02-21 ENCOUNTER — Ambulatory Visit (HOSPITAL_COMMUNITY): Payer: Medicaid Other

## 2016-02-21 ENCOUNTER — Encounter (HOSPITAL_COMMUNITY): Payer: Self-pay

## 2016-02-21 DIAGNOSIS — Z8679 Personal history of other diseases of the circulatory system: Secondary | ICD-10-CM

## 2016-02-21 DIAGNOSIS — Z3689 Encounter for other specified antenatal screening: Secondary | ICD-10-CM

## 2016-02-21 DIAGNOSIS — O10012 Pre-existing essential hypertension complicating pregnancy, second trimester: Secondary | ICD-10-CM | POA: Diagnosis not present

## 2016-02-21 DIAGNOSIS — O10912 Unspecified pre-existing hypertension complicating pregnancy, second trimester: Secondary | ICD-10-CM

## 2016-02-21 DIAGNOSIS — O0992 Supervision of high risk pregnancy, unspecified, second trimester: Secondary | ICD-10-CM

## 2016-02-21 DIAGNOSIS — O99212 Obesity complicating pregnancy, second trimester: Secondary | ICD-10-CM | POA: Insufficient documentation

## 2016-02-21 DIAGNOSIS — Z36 Encounter for antenatal screening of mother: Secondary | ICD-10-CM | POA: Diagnosis present

## 2016-02-21 DIAGNOSIS — Z3A19 19 weeks gestation of pregnancy: Secondary | ICD-10-CM

## 2016-02-21 DIAGNOSIS — O2692 Pregnancy related conditions, unspecified, second trimester: Secondary | ICD-10-CM | POA: Diagnosis not present

## 2016-02-24 ENCOUNTER — Encounter: Payer: Medicaid Other | Admitting: Obstetrics and Gynecology

## 2016-03-09 ENCOUNTER — Ambulatory Visit: Payer: Medicaid Other | Admitting: Family Medicine

## 2016-03-09 ENCOUNTER — Ambulatory Visit (INDEPENDENT_AMBULATORY_CARE_PROVIDER_SITE_OTHER): Payer: Medicaid Other | Admitting: Family Medicine

## 2016-03-09 VITALS — BP 141/87 | HR 86 | Wt 213.4 lb

## 2016-03-09 DIAGNOSIS — R87622 Low grade squamous intraepithelial lesion on cytologic smear of vagina (LGSIL): Secondary | ICD-10-CM | POA: Diagnosis not present

## 2016-03-09 LAB — POCT URINALYSIS DIP (DEVICE)
Glucose, UA: NEGATIVE mg/dL
Hgb urine dipstick: NEGATIVE
Leukocytes, UA: NEGATIVE
Nitrite: NEGATIVE
Protein, ur: NEGATIVE mg/dL
Specific Gravity, Urine: 1.02 (ref 1.005–1.030)
Urobilinogen, UA: 1 mg/dL (ref 0.0–1.0)
pH: 5.5 (ref 5.0–8.0)

## 2016-03-09 NOTE — Progress Notes (Signed)
    GYNECOLOGY CLINIC COLPOSCOPY PROCEDURE NOTE  25 y.o. G1P0 here for colposcopy for low-grade squamous intraepithelial neoplasia (LGSIL - encompassing HPV,mild dysplasia,CIN I) pap smear on 12/23/2015. Discussed role for HPV in cervical dysplasia, need for surveillance.  Patient given informed consent, signed copy in the chart, time out was performed.  Placed in lithotomy position. Cervix viewed with speculum and colposcope after application of acetic acid.   Colposcopy adequate? Yes  visible lesion(s) at 6-9  O'clock- acetowhtie changes, not dense. No mosaicism.  did not collect biopsies.  Patient will need repeat colpo postpartum with possible bx  Federico FlakeKimberly Niles Starr Urias, MD , MPH, ABFM Family Medicine, OB Fellow [facility] [place]

## 2016-03-13 ENCOUNTER — Encounter: Payer: Medicaid Other | Admitting: Family Medicine

## 2016-03-14 ENCOUNTER — Encounter: Payer: Self-pay | Admitting: General Practice

## 2016-03-20 ENCOUNTER — Encounter (HOSPITAL_COMMUNITY): Payer: Self-pay

## 2016-03-20 ENCOUNTER — Other Ambulatory Visit (HOSPITAL_COMMUNITY): Payer: Self-pay | Admitting: Obstetrics and Gynecology

## 2016-03-20 ENCOUNTER — Ambulatory Visit (HOSPITAL_COMMUNITY)
Admission: RE | Admit: 2016-03-20 | Discharge: 2016-03-20 | Disposition: A | Discharge status: Home / Self Care | Payer: Medicaid Other | Source: Ambulatory Visit | Attending: Family Medicine | Admitting: Family Medicine

## 2016-03-20 DIAGNOSIS — O99212 Obesity complicating pregnancy, second trimester: Secondary | ICD-10-CM | POA: Diagnosis not present

## 2016-03-20 DIAGNOSIS — O10912 Unspecified pre-existing hypertension complicating pregnancy, second trimester: Secondary | ICD-10-CM

## 2016-03-20 DIAGNOSIS — O10019 Pre-existing essential hypertension complicating pregnancy, unspecified trimester: Secondary | ICD-10-CM

## 2016-03-20 DIAGNOSIS — O10012 Pre-existing essential hypertension complicating pregnancy, second trimester: Secondary | ICD-10-CM | POA: Diagnosis present

## 2016-03-20 DIAGNOSIS — O2692 Pregnancy related conditions, unspecified, second trimester: Secondary | ICD-10-CM

## 2016-03-20 DIAGNOSIS — Z3A23 23 weeks gestation of pregnancy: Secondary | ICD-10-CM | POA: Diagnosis not present

## 2016-04-03 ENCOUNTER — Ambulatory Visit (HOSPITAL_COMMUNITY)
Admission: RE | Admit: 2016-04-03 | Discharge: 2016-04-03 | Disposition: A | Discharge status: Home / Self Care | Payer: Medicaid Other | Source: Ambulatory Visit | Attending: Family Medicine | Admitting: Family Medicine

## 2016-04-03 ENCOUNTER — Encounter (HOSPITAL_COMMUNITY): Payer: Self-pay

## 2016-04-03 DIAGNOSIS — O10912 Unspecified pre-existing hypertension complicating pregnancy, second trimester: Secondary | ICD-10-CM | POA: Diagnosis not present

## 2016-04-03 NOTE — ED Notes (Signed)
Pt in MFM for growth US.  Growth US not indicated today.  Pt initial BP elevated, pt stated "I just took my medicine a few minutes ago."  BP repeated, BP continues to be elevated.  Dr Vergie LivingPickens notified of elevated BP.  Pt to keep next appt in the clinic on 04/06/16 and to increase labetalol to 300 mg BID.  Pt informed of medication change and voiced understanding.

## 2016-04-05 NOTE — Addendum Note (Signed)
Encounter addended by: Heidi DachMelanie A Robb, RN on: 04/05/2016  1:54 PM<BR>     Documentation filed: Charges VN

## 2016-04-06 ENCOUNTER — Ambulatory Visit (INDEPENDENT_AMBULATORY_CARE_PROVIDER_SITE_OTHER): Payer: Medicaid Other | Admitting: Obstetrics and Gynecology

## 2016-04-06 ENCOUNTER — Encounter: Payer: Medicaid Other | Admitting: Family Medicine

## 2016-04-06 ENCOUNTER — Encounter: Payer: Self-pay | Admitting: Obstetrics and Gynecology

## 2016-04-06 VITALS — BP 138/100 | HR 74 | Wt 222.9 lb

## 2016-04-06 DIAGNOSIS — O10912 Unspecified pre-existing hypertension complicating pregnancy, second trimester: Secondary | ICD-10-CM

## 2016-04-06 LAB — POCT URINALYSIS DIP (DEVICE)
Glucose, UA: NEGATIVE mg/dL
Hgb urine dipstick: NEGATIVE
Ketones, ur: NEGATIVE mg/dL
Leukocytes, UA: NEGATIVE
Nitrite: NEGATIVE
Protein, ur: 100 mg/dL — AB
Specific Gravity, Urine: 1.02 (ref 1.005–1.030)
Urobilinogen, UA: 1 mg/dL (ref 0.0–1.0)
pH: 6.5 (ref 5.0–8.0)

## 2016-04-06 NOTE — Progress Notes (Signed)
Subjective:  Patty Gilmore is a 25 y.o. G1P0 at 6468w3d being seen today for ongoing prenatal care.  She is currently monitored for the following issues for this high-risk pregnancy and has Acanthosis nigricans; LVH (left ventricular hypertrophy); HTN (hypertension); Papilledema; Severe obesity (BMI >= 40) (HCC); Pseudotumor cerebri; Supervision of high risk pregnancy, antepartum; HTN in pregnancy, chronic; and LGSIL Pap smear of vagina on her problem list.  Patient reports lower abdominal intermittent  pain x 1 wk. Worse when at work ([employer]). No H/A, visuual disturbance, upper abd pain. Contractions: Not present. Vag. Bleeding: None.  Movement: Present. Denies leaking of fluid.   The following portions of the patient's history were reviewed and updated as appropriate: allergies, current medications, past family history, past medical history, past social history, past surgical history and problem list. Problem list updated. Seen in MAU 2 days ago and labetalol increased from 200 bid to 300 bid. Objective:   Filed Vitals:   04/06/16 1258 04/06/16 1259  BP: 143/97 138/100  Pulse: 74   Weight: 222 lb 14.4 oz (101.107 kg)     Fetal Status: Fetal Heart Rate (bpm): 145   Movement: Present     General:  Alert, oriented and cooperative. Patient is in no acute distress.  Skin: Skin is warm and dry. No rash noted.   Cardiovascular: Normal heart rate noted  Respiratory: Normal respiratory effort, no problems with respiration noted  Abdomen: Soft, gravid, appropriate for gestational age. Pain/Pressure: Present     Pelvic:  Cervical exam performed        Extremities: Normal range of motion.  Edema: Trace  Mental Status: Normal mood and affect. Normal behavior. Normal judgment and thought content.   Urinalysis:      Assessment and Plan:  Pregnancy: G1P0 at 6368w3d  1. HTN in pregnancy, chronic, second trimester Not well controlled D/W Dr. Debroah LoopArnold > increase labetalol to 400mg  bid Preterm labor  symptoms and general obstetric precautions including but not limited to vaginal bleeding, contractions, leaking of fluid and fetal movement were reviewed in detail with the patient. Please refer to After Visit Summary for other counseling recommendations.  Return in about 1 week (around 04/13/2016). Keep US appointment 04/11/16   Danae Orleanseirdre C Poe, CNM

## 2016-04-06 NOTE — Patient Instructions (Signed)
Round Ligament Pain During Pregnancy   Round ligament pain is a sharp pain or jabbing feeling often felt in the lower belly or groin area on one or both sides. It is one of the most common complaints during pregnancy and is considered a normal part of pregnancy. It is most often felt during the second trimester.   Here is what you need to know about round ligament pain, including some tips to help you feel better.   Causes of Round Ligament Pain   Several thick ligaments surround and support your womb (uterus) as it grows during pregnancy. One of them is called the round ligament.   The round ligament connects the front part of the womb to your groin, the area where your legs attach to your pelvis. The round ligament normally tightens and relaxes slowly.   As your baby and womb grow, the round ligament stretches. That makes it more likely to become strained.   Sudden movements can cause the ligament to tighten quickly, like a rubber band snapping. This causes a sudden and quick jabbing feeling.   Symptoms of Round Ligament Pain   Round ligament pain can be concerning and uncomfortable. But it is considered normal as your body changes during pregnancy.   The symptoms of round ligament pain include a sharp, sudden spasm in the belly. It usually affects the right side, but it may happen on both sides. The pain only lasts a few seconds.   Exercise may cause the pain, as will rapid movements such as:  sneezing  coughing  laughing  rolling over in bed  standing up too quickly   Treatment of Round Ligament Pain   Here are some tips that may help reduce your discomfort:   Pain relief. Take over-the-counter acetaminophen for pain, if necessary. Ask your doctor if this is OK.   Exercise. Get plenty of exercise to keep your stomach (core) muscles strong. Doing stretching exercises or prenatal yoga can be helpful. Ask your doctor which exercises are safe for you and your baby.   A helpful  exercise involves putting your hands and knees on the floor, lowering your head, and pushing your backside into the air.   Avoid sudden movements. Change positions slowly (such as standing up or sitting down) to avoid sudden movements that may cause stretching and pain.   Flex your hips. Bend and flex your hips before you cough, sneeze, or laugh to avoid pulling on the ligaments.   Apply warmth. A heating pad or warm bath may be helpful. Ask your doctor if this is OK. Extreme heat can be dangerous to the baby.   You should try to modify your daily activity level and avoid positions that may worsen the condition.   When to Call the Doctor/Midwife   Always tell your doctor or midwife about any type of pain you have during pregnancy. Round ligament pain is quick and doesn't last long.   Call your health care provider immediately if you have:  severe pain  fever  chills  pain on urination  difficulty walking   Belly pain during pregnancy can be due to many different causes. It is important for your doctor to rule out more serious conditions, including pregnancy complications such as placenta abruption or non-pregnancy illnesses such as:  inguinal hernia  appendicitis  stomach, liver, and kidney problems  Preterm labor pains may sometimes be mistaken for round ligament pain.   Second Trimester of Pregnancy The second trimester is from week  week 13 through week 28, months 4 through 6. The second trimester is often a time when you feel your best. Your body has also adjusted to being pregnant, and you begin to feel better physically. Usually, morning sickness has lessened or quit completely, you may have more energy, and you may have an increase in appetite. The second trimester is also a time when the fetus is growing rapidly. At the end of the sixth month, the fetus is about 9 inches long and weighs about 1½ pounds. You will likely begin to feel the baby move (quickening) between 18 and 20 weeks of  the pregnancy. °BODY CHANGES °Your body goes through many changes during pregnancy. The changes vary from woman to woman.  °· Your weight will continue to increase. You will notice your lower abdomen bulging out. °· You may begin to get stretch marks on your hips, abdomen, and breasts. °· You may develop headaches that can be relieved by medicines approved by your health care provider. °· You may urinate more often because the fetus is pressing on your bladder. °· You may develop or continue to have heartburn as a result of your pregnancy. °· You may develop constipation because certain hormones are causing the muscles that push waste through your intestines to slow down. °· You may develop hemorrhoids or swollen, bulging veins (varicose veins). °· You may have back pain because of the weight gain and pregnancy hormones relaxing your joints between the bones in your pelvis and as a result of a shift in weight and the muscles that support your balance. °· Your breasts will continue to grow and be tender. °· Your gums may bleed and may be sensitive to brushing and flossing. °· Dark spots or blotches (chloasma, mask of pregnancy) may develop on your face. This will likely fade after the baby is born. °· A dark line from your belly button to the pubic area (linea nigra) may appear. This will likely fade after the baby is born. °· You may have changes in your hair. These can include thickening of your hair, rapid growth, and changes in texture. Some women also have hair loss during or after pregnancy, or hair that feels dry or thin. Your hair will most likely return to normal after your baby is born. °WHAT TO EXPECT AT YOUR PRENATAL VISITS °During a routine prenatal visit: °· You will be weighed to make sure you and the fetus are growing normally. °· Your blood pressure will be taken. °· Your abdomen will be measured to track your baby's growth. °· The fetal heartbeat will be listened to. °· Any test results from the  previous visit will be discussed. °Your health care provider may ask you: °· How you are feeling. °· If you are feeling the baby move. °· If you have had any abnormal symptoms, such as leaking fluid, bleeding, severe headaches, or abdominal cramping. °· If you are using any tobacco products, including cigarettes, chewing tobacco, and electronic cigarettes. °· If you have any questions. °Other tests that may be performed during your second trimester include: °· Blood tests that check for: °¨ Low iron levels (anemia). °¨ Gestational diabetes (between 24 and 28 weeks). °¨ Rh antibodies. °· Urine tests to check for infections, diabetes, or protein in the urine. °· An ultrasound to confirm the proper growth and development of the baby. °· An amniocentesis to check for possible genetic problems. °· Fetal screens for spina bifida and Down syndrome. °· HIV (human immunodeficiency virus)   Routine prenatal testing includes screening for HIV, unless you choose not to have this test. HOME CARE INSTRUCTIONS   Avoid all smoking, herbs, alcohol, and unprescribed drugs. These chemicals affect the formation and growth of the baby.  Do not use any tobacco products, including cigarettes, chewing tobacco, and electronic cigarettes. If you need help quitting, ask your health care provider. You may receive counseling support and other resources to help you quit.  Follow your health care provider's instructions regarding medicine use. There are medicines that are either safe or unsafe to take during pregnancy.  Exercise only as directed by your health care provider. Experiencing uterine cramps is a good sign to stop exercising.  Continue to eat regular, healthy meals.  Wear a good support bra for breast tenderness.  Do not use hot tubs, steam rooms, or saunas.  Wear your seat belt at all times when driving.  Avoid raw meat, uncooked cheese, cat litter boxes, and soil used by cats. These carry germs that can  cause birth defects in the baby.  Take your prenatal vitamins.  Take 1500-2000 mg of calcium daily starting at the 20th week of pregnancy until you deliver your baby.  Try taking a stool softener (if your health care provider approves) if you develop constipation. Eat more high-fiber foods, such as fresh vegetables or fruit and whole grains. Drink plenty of fluids to keep your urine clear or pale yellow.  Take warm sitz baths to soothe any pain or discomfort caused by hemorrhoids. Use hemorrhoid cream if your health care provider approves.  If you develop varicose veins, wear support hose. Elevate your feet for 15 minutes, 3-4 times a day. Limit salt in your diet.  Avoid heavy lifting, wear low heel shoes, and practice good posture.  Rest with your legs elevated if you have leg cramps or low back pain.  Visit your dentist if you have not gone yet during your pregnancy. Use a soft toothbrush to brush your teeth and be gentle when you floss.  A sexual relationship may be continued unless your health care provider directs you otherwise.  Continue to go to all your prenatal visits as directed by your health care provider. SEEK MEDICAL CARE IF:   You have dizziness.  You have mild pelvic cramps, pelvic pressure, or nagging pain in the abdominal area.  You have persistent nausea, vomiting, or diarrhea.  You have a bad smelling vaginal discharge.  You have pain with urination. SEEK IMMEDIATE MEDICAL CARE IF:   You have a fever.  You are leaking fluid from your vagina.  You have spotting or bleeding from your vagina.  You have severe abdominal cramping or pain.  You have rapid weight gain or loss.  You have shortness of breath with chest pain.  You notice sudden or extreme swelling of your face, hands, ankles, feet, or legs.  You have not felt your baby move in over an hour.  You have severe headaches that do not go away with medicine.  You have vision changes.   This  information is not intended to replace advice given to you by your health care provider. Make sure you discuss any questions you have with your health care provider.   Document Released: 09/12/2001 Document Revised: 10/09/2014 Document Reviewed: 11/19/2012 Elsevier Interactive Patient Education [employer]2016 [employer] Inc.

## 2016-04-10 ENCOUNTER — Encounter (HOSPITAL_COMMUNITY): Payer: Self-pay | Admitting: *Deleted

## 2016-04-10 ENCOUNTER — Inpatient Hospital Stay (HOSPITAL_COMMUNITY)
Admission: AD | Admit: 2016-04-10 | Discharge: 2016-04-13 | DRG: 781 | Disposition: A | Discharge status: Home / Self Care | Payer: Medicaid Other | Source: Ambulatory Visit | Attending: Obstetrics & Gynecology | Admitting: Obstetrics & Gynecology

## 2016-04-10 ENCOUNTER — Encounter (HOSPITAL_COMMUNITY): Payer: Self-pay

## 2016-04-10 ENCOUNTER — Ambulatory Visit (HOSPITAL_COMMUNITY)
Admission: RE | Admit: 2016-04-10 | Discharge: 2016-04-10 | Disposition: A | Discharge status: Home / Self Care | Payer: Medicaid Other | Source: Ambulatory Visit | Attending: Family Medicine | Admitting: Family Medicine

## 2016-04-10 ENCOUNTER — Ambulatory Visit (INDEPENDENT_AMBULATORY_CARE_PROVIDER_SITE_OTHER): Payer: Medicaid Other | Admitting: Obstetrics and Gynecology

## 2016-04-10 VITALS — BP 161/113 | HR 75 | Wt 223.6 lb

## 2016-04-10 DIAGNOSIS — O132 Gestational [pregnancy-induced] hypertension without significant proteinuria, second trimester: Secondary | ICD-10-CM | POA: Diagnosis present

## 2016-04-10 DIAGNOSIS — D649 Anemia, unspecified: Secondary | ICD-10-CM | POA: Diagnosis present

## 2016-04-10 DIAGNOSIS — IMO0002 Reserved for concepts with insufficient information to code with codable children: Secondary | ICD-10-CM | POA: Diagnosis present

## 2016-04-10 DIAGNOSIS — O0992 Supervision of high risk pregnancy, unspecified, second trimester: Secondary | ICD-10-CM

## 2016-04-10 DIAGNOSIS — O10912 Unspecified pre-existing hypertension complicating pregnancy, second trimester: Secondary | ICD-10-CM

## 2016-04-10 DIAGNOSIS — O99012 Anemia complicating pregnancy, second trimester: Secondary | ICD-10-CM | POA: Diagnosis present

## 2016-04-10 DIAGNOSIS — R7989 Other specified abnormal findings of blood chemistry: Secondary | ICD-10-CM | POA: Diagnosis present

## 2016-04-10 DIAGNOSIS — O99212 Obesity complicating pregnancy, second trimester: Secondary | ICD-10-CM | POA: Diagnosis not present

## 2016-04-10 DIAGNOSIS — Z3A26 26 weeks gestation of pregnancy: Secondary | ICD-10-CM

## 2016-04-10 DIAGNOSIS — Z833 Family history of diabetes mellitus: Secondary | ICD-10-CM

## 2016-04-10 DIAGNOSIS — O10012 Pre-existing essential hypertension complicating pregnancy, second trimester: Principal | ICD-10-CM | POA: Diagnosis present

## 2016-04-10 DIAGNOSIS — Z6841 Body Mass Index (BMI) 40.0 and over, adult: Secondary | ICD-10-CM

## 2016-04-10 LAB — COMPREHENSIVE METABOLIC PANEL
ALT: 24 U/L (ref 14–54)
AST: 26 U/L (ref 15–41)
Albumin: 3.5 g/dL (ref 3.5–5.0)
Alkaline Phosphatase: 69 U/L (ref 38–126)
Anion gap: 6 (ref 5–15)
BUN: 13 mg/dL (ref 6–20)
CO2: 21 mmol/L — ABNORMAL LOW (ref 22–32)
Calcium: 9.2 mg/dL (ref 8.9–10.3)
Chloride: 107 mmol/L (ref 101–111)
Creatinine, Ser: 1.14 mg/dL — ABNORMAL HIGH (ref 0.44–1.00)
GFR calc Af Amer: >60 mL/min (ref >60)
GFR calc non Af Amer: >60 mL/min (ref >60)
Glucose, Bld: 74 mg/dL (ref 65–99)
Potassium: 4.9 mmol/L (ref 3.5–5.1)
Sodium: 134 mmol/L — ABNORMAL LOW (ref 135–145)
Total Bilirubin: 0.4 mg/dL (ref 0.3–1.2)
Total Protein: 7.4 g/dL (ref 6.5–8.1)

## 2016-04-10 LAB — URINE MICROSCOPIC-ADD ON

## 2016-04-10 LAB — PROTEIN / CREATININE RATIO, URINE
Creatinine, Urine: 43 mg/dL
Protein Creatinine Ratio: 0.21 mg/mg{Cre} — ABNORMAL HIGH (ref 0.00–0.15)
Total Protein, Urine: 9 mg/dL

## 2016-04-10 LAB — URINALYSIS, ROUTINE W REFLEX MICROSCOPIC
Bilirubin Urine: NEGATIVE
Glucose, UA: NEGATIVE mg/dL
Hgb urine dipstick: NEGATIVE
Ketones, ur: NEGATIVE mg/dL
Nitrite: NEGATIVE
Protein, ur: NEGATIVE mg/dL
Specific Gravity, Urine: <1.005 — ABNORMAL LOW (ref 1.005–1.030)
pH: 6 (ref 5.0–8.0)

## 2016-04-10 LAB — POCT URINALYSIS DIP (DEVICE)
Glucose, UA: NEGATIVE mg/dL
Hgb urine dipstick: NEGATIVE
Ketones, ur: NEGATIVE mg/dL
Leukocytes, UA: NEGATIVE
Nitrite: NEGATIVE
Protein, ur: 100 mg/dL — AB
Specific Gravity, Urine: >=1.03 (ref 1.005–1.030)
Urobilinogen, UA: 0.2 mg/dL (ref 0.0–1.0)
pH: 6 (ref 5.0–8.0)

## 2016-04-10 LAB — CBC
HCT: 27.1 % — ABNORMAL LOW (ref 35.0–45.0)
HCT: 27.9 % — ABNORMAL LOW (ref 36.0–46.0)
Hemoglobin: 9.3 g/dL — ABNORMAL LOW (ref 11.7–15.5)
Hemoglobin: 9.5 g/dL — ABNORMAL LOW (ref 12.0–15.0)
MCH: 32 pg (ref 27.0–33.0)
MCH: 31.7 pg (ref 26.0–34.0)
MCHC: 34.3 g/dL (ref 32.0–36.0)
MCHC: 34.1 g/dL (ref 30.0–36.0)
MCV: 93.1 fL (ref 80.0–100.0)
MCV: 93 fL (ref 78.0–100.0)
MPV: 9 fL (ref 7.5–12.5)
Platelets: 231 10*3/uL (ref 140–400)
Platelets: 240 10*3/uL (ref 150–400)
RBC: 2.91 MIL/uL — ABNORMAL LOW (ref 3.80–5.10)
RBC: 3 MIL/uL — ABNORMAL LOW (ref 3.87–5.11)
RDW: 12.9 % (ref 11.0–15.0)
RDW: 13.1 % (ref 11.5–15.5)
WBC: 7.7 10*3/uL (ref 3.8–10.8)
WBC: 8.1 10*3/uL (ref 4.0–10.5)

## 2016-04-10 MED ORDER — LACTATED RINGERS IV SOLN
2.0000 g/h | INTRAVENOUS | Status: AC
  Administered 2016-04-10: 6 g/h via INTRAVENOUS
  Administered 2016-04-11 (×2): 2 g/h via INTRAVENOUS
  Filled 2016-04-10 (×2): qty 80

## 2016-04-10 MED ORDER — CYCLOBENZAPRINE HCL 10 MG PO TABS
10.0000 mg | ORAL_TABLET | Freq: Once | ORAL | Status: AC
  Administered 2016-04-10: 10 mg via ORAL
  Filled 2016-04-10: qty 1

## 2016-04-10 MED ORDER — ACETAMINOPHEN 325 MG PO TABS: 650.0000 mg | ORAL_TABLET | Freq: Once | ORAL | Status: DC

## 2016-04-10 MED ORDER — LABETALOL HCL 300 MG PO TABS: 300.0000 mg | ORAL_TABLET | Freq: Three times a day (TID) | ORAL | Status: DC

## 2016-04-10 MED ORDER — ZOLPIDEM TARTRATE 5 MG PO TABS: 5.0000 mg | ORAL_TABLET | Freq: Every evening | ORAL | Status: DC | PRN

## 2016-04-10 MED ORDER — LABETALOL HCL 5 MG/ML IV SOLN
80.0000 mg | Freq: Once | INTRAVENOUS | Status: AC
  Administered 2016-04-10: 80 mg via INTRAVENOUS
  Filled 2016-04-10: qty 16

## 2016-04-10 MED ORDER — MAGNESIUM SULFATE BOLUS VIA INFUSION
6.0000 g | Freq: Once | INTRAVENOUS | Status: DC
  Filled 2016-04-10: qty 500

## 2016-04-10 MED ORDER — ASPIRIN EC 81 MG PO TBEC: 81.0000 mg | DELAYED_RELEASE_TABLET | Freq: Every day | ORAL | Status: DC

## 2016-04-10 MED ORDER — LABETALOL HCL 5 MG/ML IV SOLN: 20.0000 mg | INTRAVENOUS | Status: DC | PRN

## 2016-04-10 MED ORDER — PROMETHAZINE HCL 25 MG/ML IJ SOLN
12.5000 mg | INTRAMUSCULAR | Status: DC | PRN
  Administered 2016-04-10 – 2016-04-11 (×3): 12.5 mg via INTRAVENOUS
  Filled 2016-04-10 (×3): qty 1

## 2016-04-10 MED ORDER — BETAMETHASONE SOD PHOS & ACET 6 (3-3) MG/ML IJ SUSP
12.0000 mg | INTRAMUSCULAR | Status: AC
  Administered 2016-04-10 – 2016-04-11 (×2): 12 mg via INTRAMUSCULAR
  Filled 2016-04-10 (×2): qty 2

## 2016-04-10 MED ORDER — LABETALOL HCL 100 MG PO TABS
300.0000 mg | ORAL_TABLET | Freq: Three times a day (TID) | ORAL | Status: DC
  Administered 2016-04-10: 300 mg via ORAL
  Filled 2016-04-10: qty 3

## 2016-04-10 MED ORDER — DOCUSATE SODIUM 100 MG PO CAPS
100.0000 mg | ORAL_CAPSULE | Freq: Every day | ORAL | Status: DC
  Administered 2016-04-11 – 2016-04-13 (×3): 100 mg via ORAL
  Filled 2016-04-10 (×3): qty 1

## 2016-04-10 MED ORDER — LABETALOL HCL 5 MG/ML IV SOLN
20.0000 mg | INTRAVENOUS | Status: AC | PRN
  Administered 2016-04-10 (×2): 20 mg via INTRAVENOUS
  Filled 2016-04-10: qty 8
  Filled 2016-04-10: qty 4

## 2016-04-10 MED ORDER — FENTANYL CITRATE (PF) 100 MCG/2ML IJ SOLN
100.0000 ug | INTRAMUSCULAR | Status: DC | PRN
  Administered 2016-04-10 – 2016-04-11 (×2): 100 ug via INTRAVENOUS
  Filled 2016-04-10 (×2): qty 2

## 2016-04-10 MED ORDER — CALCIUM CARBONATE ANTACID 500 MG PO CHEW: 2.0000 | CHEWABLE_TABLET | ORAL | Status: DC | PRN

## 2016-04-10 MED ORDER — LABETALOL HCL 300 MG PO TABS
600.0000 mg | ORAL_TABLET | Freq: Three times a day (TID) | ORAL | Status: DC
Start: 2016-04-10 — End: 2016-04-12
  Administered 2016-04-11 (×4): 600 mg via ORAL
  Filled 2016-04-10 (×5): qty 2

## 2016-04-10 MED ORDER — NIFEDIPINE ER OSMOTIC RELEASE 30 MG PO TB24
60.0000 mg | ORAL_TABLET | Freq: Once | ORAL | Status: AC
  Administered 2016-04-10: 60 mg via ORAL
  Filled 2016-04-10: qty 2

## 2016-04-10 MED ORDER — NIFEDIPINE ER OSMOTIC RELEASE 30 MG PO TB24: 30.0000 mg | ORAL_TABLET | Freq: Two times a day (BID) | ORAL | Status: DC

## 2016-04-10 MED ORDER — PRENATAL MULTIVITAMIN CH
1.0000 | ORAL_TABLET | Freq: Every day | ORAL | Status: DC
  Administered 2016-04-11 – 2016-04-13 (×3): 1 via ORAL
  Filled 2016-04-10 (×3): qty 1

## 2016-04-10 MED ORDER — HYDRALAZINE HCL 20 MG/ML IJ SOLN
5.0000 mg | INTRAMUSCULAR | Status: AC | PRN
  Administered 2016-04-10: 5 mg via INTRAVENOUS
  Administered 2016-04-10: 10 mg via INTRAVENOUS
  Filled 2016-04-10: qty 1

## 2016-04-10 MED ORDER — ACETAMINOPHEN 500 MG PO TABS
1000.0000 mg | ORAL_TABLET | Freq: Once | ORAL | Status: AC
  Administered 2016-04-10: 1000 mg via ORAL
  Filled 2016-04-10: qty 2

## 2016-04-10 MED ORDER — ACETAMINOPHEN 325 MG PO TABS
650.0000 mg | ORAL_TABLET | ORAL | Status: DC | PRN
  Administered 2016-04-11 – 2016-04-12 (×2): 650 mg via ORAL
  Filled 2016-04-10 (×2): qty 2

## 2016-04-10 MED ORDER — HYDRALAZINE HCL 20 MG/ML IJ SOLN
5.0000 mg | INTRAMUSCULAR | Status: AC | PRN
  Administered 2016-04-11 (×2): 5 mg via INTRAVENOUS
  Filled 2016-04-10 (×2): qty 1

## 2016-04-10 NOTE — Progress Notes (Signed)
Dr Erin FullingHarraway-Smith consulted Dr Claudean SeveranceWhitecar  BPs remain quite elevated despite large doses of Labetalol  Also had added Procardia  He recommends starting Magnesium Sulfate infusion to see if that will augment our BP control

## 2016-04-10 NOTE — Progress Notes (Signed)
1 hour gtt due at 10:17  B/P 157/92 second time

## 2016-04-10 NOTE — H&P (Signed)
Patty Gilmore is a 25 y.o. female presenting for Headache and exacerbation of hypertension. History is remarkable for chronic HTN and pseudotumor cerebri . Maternal Medical History:  Reason for admission: Nausea. Hypertension and headache  Fetal activity: Perceived fetal activity is normal.   Last perceived fetal movement was within the past hour.    Prenatal complications: PIH.   No bleeding, infection or preterm labor.   Prenatal Complications - Diabetes: none.    OB History    Gravida Para Term Preterm AB TAB SAB Ectopic Multiple Living   1              Past Medical History  Diagnosis Date  . 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 Hypertension     was taking amlodipine stopped about 1 yr ago - no insurance - had been on meds since age 89   Past Surgical History  Procedure Laterality Date  . No past surgeries    . Lumbar puncture  07/20/2014    fluid build up around spinal column   Family History: family history includes Diabetes in her maternal grandmother and paternal grandmother; Thyroid disease in her mother. Social History:  reports that she has never smoked. She does not have any smokeless tobacco history on file. She reports that she uses illicit drugs (Marijuana). She reports that she does not drink alcohol.   Prenatal Transfer Tool  Maternal Diabetes: No Genetic Screening: Normal Maternal Ultrasounds/Referrals: Normal Fetal Ultrasounds or other Referrals:  None Maternal Substance Abuse:  No Significant Maternal Medications:  Meds include: Other:  Significant Maternal Lab Results:  None Other Comments:  None  Review of Systems  Constitutional: Negative for fever, chills and malaise/fatigue.  Eyes: Negative for blurred vision and double vision.  Respiratory: Negative for shortness of breath.   Cardiovascular: Negative for chest pain.  Gastrointestinal: Negative for nausea, vomiting, abdominal  pain, diarrhea and constipation.  Musculoskeletal: Positive for neck pain. Negative for back pain.  Neurological: Positive for headaches. Negative for dizziness, sensory change, focal weakness, seizures, loss of consciousness and weakness.      Blood pressure 170/112, pulse 63, last menstrual period 10/11/2015. Maternal Exam:  Abdomen: Patient reports no abdominal tenderness. Fundal height is 26.       Fetal Exam Fetal Monitor Review: Mode: ultrasound.   Baseline rate: 140.  Variability: moderate (6-25 bpm).   Pattern: no decelerations.    Fetal State Assessment: Category I - tracings are normal.     Physical Exam  Constitutional: She is oriented to person, place, and time. She appears well-developed and well-nourished. No distress.  HENT:  Head: Normocephalic.  Neck: Normal range of motion. Neck supple.  Cardiovascular: Normal rate, regular rhythm and normal heart sounds.  Exam reveals no gallop and no friction rub.   No murmur heard. Respiratory: Effort normal and breath sounds normal. No respiratory distress. She has no wheezes. She has no rales. She exhibits no tenderness.  GI: Soft. She exhibits no distension and no mass. There is no tenderness. There is no rebound and no guarding.  Musculoskeletal: Normal range of motion. She exhibits edema (trace).  Neurological: She is alert and oriented to person, place, and time. She has normal reflexes. She exhibits normal muscle tone (No clonus).  Skin: Skin is warm and dry.  Psychiatric: She has a normal mood and affect.    Prenatal labs: ABO, Rh: O/Positive/-- (03/23 0000) Antibody: Negative (03/23 0000) Rubella:  Immune (03/23 0000) RPR: Nonreactive (03/23 0000)  HBsAg: Negative (03/23 0000)  HIV: Non-reactive (03/23 0000)  GBS:     Assessment/Plan: SIUP at [redacted]w[redacted]d  Headache Hypertension, chronic with exacerbation to severe levels  Admit for observation and blood pressure control Labetalol increased to  tid  today Will add Labetalol  IV now 24 hour urine     [facility] 04/10/2016, 9:32 PM

## 2016-04-10 NOTE — MAU Provider Note (Signed)
MAU HISTORY AND PHYSICAL  Chief Complaint:  Hypertension   Patty Gilmore is a 25 y.o.  G1P0 with IUP at [redacted]w[redacted]d presenting for Hypertension . She reports mild constant dull temporal h/a which she believes is related to neck pain she has off and on and to the fact that she has not eaten all day.  She has not tried anything for her pain and reports nothing makes it better or worse.    Patient states she has been having  none contractions, none vaginal bleeding, intact membranes, with active fetal movement.    Past Medical History  Diagnosis Date  . 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 Hypertension     was taking amlodipine stopped about 1 yr ago - no insurance - had been on meds since age 67    Past Surgical History  Procedure Laterality Date  . No past surgeries    . Lumbar puncture  07/20/2014    fluid build up around spinal column    Family History  Problem Relation Age of Onset  . Diabetes Maternal Grandmother   . Diabetes Paternal Grandmother   . Thyroid disease Mother     Social History  Substance Use Topics  . Smoking status: Never Smoker   . Smokeless tobacco: None  . Alcohol Use: No     Comment: Rarely (less than once per month)    No Known Allergies  Facility-administered medications prior to admission  Medication Dose Route Frequency Provider Last Rate Last Dose  . Tdap (BOOSTRIX) injection 0.5 mL  0.5 mL Intramuscular Once Tonye Pearson, MD       Prescriptions prior to admission  Medication Sig Dispense Refill Last Dose  . acetaminophen (TYLENOL) 325 MG tablet Take 650 mg by mouth every 6 (six) hours as needed for headache. Reported on 01/13/2016   04/09/2016 at Unknown time  . aspirin EC 81 MG tablet Take 1 tablet (81 mg total) by mouth daily. 60 tablet 2 04/09/2016 at Unknown time  . labetalol (NORMODYNE) 200 MG tablet Take 1 tablet (200 mg total) by mouth 2 (two) times daily. 60 tablet 3  04/10/2016 at 0745  . Prenatal Vit-Fe Fumarate-FA (PRENATAL MULTIVITAMIN) TABS tablet Take 1 tablet by mouth daily at 12 noon.   04/09/2016 at Unknown time  . ondansetron (ZOFRAN) 4 MG tablet Take 1 tablet (4 mg total) by mouth every 8 (eight) hours as needed for nausea or vomiting. (Patient not taking: Reported on 04/10/2016) 20 tablet 0 Not Taking    Review of Systems - Negative except for what is mentioned in HPI.  Physical Exam  Blood pressure 195/101, pulse 59, last menstrual period 10/11/2015.  Patient Vitals for the past 24 hrs:  BP Pulse  04/10/16 2129 (!) 170/112 mmHg 63  04/10/16 1926 178/91 mmHg (!) 57  04/10/16 1924 (!) 176/101 mmHg 66  04/10/16 1841 154/95 mmHg 70  04/10/16 1747 157/92 mmHg 72  04/10/16 1700 149/97 mmHg 89  04/10/16 1546 173/95 mmHg 69  04/10/16 1545 165/100 mmHg 71  04/10/16 1513 (!) 174/101 mmHg 74  04/10/16 1446 (!) 177/110 mmHg 79  04/10/16 1426 (!) 198/111 mmHg -  04/10/16 1402 (!) 195/101 mmHg (!) 52  04/10/16 1332 (!) 195/112 mmHg (!) 52  04/10/16 1316 196/98 mmHg (!) 52  04/10/16 1303 (!) 196/113 mmHg (!) 59  04/10/16 1301 (!) 204/110 mmHg (!) 55  04/10/16 1259 (!) 206/114 mmHg (!) 59  GENERAL: Well-developed, well-nourished female in no acute distress.  LUNGS: Clear to auscultation bilaterally.  HEART: Regular rate and rhythm. ABDOMEN: Soft, nontender, nondistended, gravid.  EXTREMITIES: Nontender, 2+ edema, 2+ distal pulses. FHT:  Cat 1 Contractions: very infrequent   Labs: Results for orders placed or performed during the hospital encounter of 04/10/16 (from the past 24 hour(s))  CBC   Collection Time: 04/10/16  1:21 PM  Result Value Ref Range   WBC 8.1 4.0 - 10.5 K/uL   RBC 3.00 (L) 3.87 - 5.11 MIL/uL   Hemoglobin 9.5 (L) 12.0 - 15.0 g/dL   HCT 86.527.9 (L) 78.436.0 - 69.646.0 %   MCV 93.0 78.0 - 100.0 fL   MCH 31.7 26.0 - 34.0 pg   MCHC 34.1 30.0 - 36.0 g/dL   RDW 29.513.1 28.411.5 - 13.215.5 %   Platelets 240 150 - 400 K/uL  Comprehensive metabolic  panel   Collection Time: 04/10/16  1:21 PM  Result Value Ref Range   Sodium 134 (L) 135 - 145 mmol/L   Potassium 4.9 3.5 - 5.1 mmol/L   Chloride 107 101 - 111 mmol/L   CO2 21 (L) 22 - 32 mmol/L   Glucose, Bld 74 65 - 99 mg/dL   BUN 13 6 - 20 mg/dL   Creatinine, Ser 4.401.14 (H) 0.44 - 1.00 mg/dL   Calcium 9.2 8.9 - 10.210.3 mg/dL   Total Protein 7.4 6.5 - 8.1 g/dL   Albumin 3.5 3.5 - 5.0 g/dL   AST 26 15 - 41 U/L   ALT 24 14 - 54 U/L   Alkaline Phosphatase 69 38 - 126 U/L   Total Bilirubin 0.4 0.3 - 1.2 mg/dL   GFR calc non Af Amer >60 >60 mL/min   GFR calc Af Amer >60 >60 mL/min   Anion gap 6 5 - 15  Results for orders placed or performed in visit on 04/10/16 (from the past 24 hour(s))  POCT urinalysis dip (device)   Collection Time: 04/10/16 10:19 AM  Result Value Ref Range   Glucose, UA NEGATIVE NEGATIVE mg/dL   Bilirubin Urine SMALL (A) NEGATIVE   Ketones, ur NEGATIVE NEGATIVE mg/dL   Specific Gravity, Urine >=1.030 1.005 - 1.030   Hgb urine dipstick NEGATIVE NEGATIVE   pH 6.0 5.0 - 8.0   Protein, ur 100 (A) NEGATIVE mg/dL   Urobilinogen, UA 0.2 0.0 - 1.0 mg/dL   Nitrite NEGATIVE NEGATIVE   Leukocytes, UA NEGATIVE NEGATIVE     MDM: Preeclampsia focused order set initiated when pt arrived in MAU from the office for severe range BPs.  IV hydralazine and labetalol given, then PO labetalol 300 mg and Procardia 30 mg XR given.  Pt BP down to 150s/80s-90s.  Preeclampsia labs wnl except creatinine elevated at 1.14, c/w previous labs.  Tylenol 1000 mg PO given bringing h/a pain 8/10 to 3/10.  H/a pain returned and pt reports musculoskeletal pain in her neck so Flexeril 10 mg PO x 1 dose given PO.  H/a improved with Flexeril but BP elevated again to severe range.  Consult Dr Erin FullingHarraway-Smith. Admit to antepartum for BP management for CHTN.   Assessment: Patty Gilmore is  25 y.o. G1P0 at 1632w0d  Chronic hypertension in pregnancy  Plan: Admit to antepartum for BP management Increase  labetalol to 400 TID   Patty Gilmore 7/10/20172:15 PM   I have seen this patient and agree with the above resident's note.  LEFTWICH-KIRBY, Rosamaria Donn Certified Nurse-Midwife

## 2016-04-10 NOTE — MAU Note (Signed)
Sent for PIH eval;

## 2016-04-10 NOTE — Progress Notes (Signed)
Subjective:  Patty Gilmore is a 25 y.o. G1P0 at 1145w0d being seen today for ongoing prenatal care.  She is currently monitored for the following issues for this high-risk pregnancy and has Acanthosis nigricans; LVH (left ventricular hypertrophy); Papilledema; Severe obesity (BMI >= 40) (HCC); Pseudotumor cerebri; Supervision of high risk pregnancy, antepartum; HTN in pregnancy, chronic; and LGSIL Pap smear of vagina on her problem list.  Patient reports no complaints.   Contractions: Not present.  .  Movement: Present. Denies leaking of fluid.   The following portions of the patient's history were reviewed and updated as appropriate: allergies, current medications, past family history, past medical history, past social history, past surgical history and problem list. Problem list updated.  Objective:   Filed Vitals:   04/10/16 0914  BP: 161/113  Pulse: 75  Weight: 223 lb 9.6 oz (101.424 kg)    Fetal Status: Fetal Heart Rate (bpm): 133   Movement: Present     General:  Alert, oriented and cooperative. Patient is in no acute distress.  Skin: Skin is warm and dry. No rash noted.   Cardiovascular: Normal heart rate noted  Respiratory: Normal respiratory effort, no problems with respiration noted  Abdomen: Soft, gravid, appropriate for gestational age. Pain/Pressure: Present     Pelvic:  Cervical exam deferred        Extremities: Normal range of motion.     Mental Status: Normal mood and affect. Normal behavior. Normal judgment and thought content.   Urinalysis: Urine Protein: 1+ Urine Glucose: Negative  Assessment and Plan:  Pregnancy: G1P0 at 4745w0d  1. HTN in pregnancy, chronic, second trimester Repeat BP 152/92. Patient states she is on labetalol 400 bid. She states that she was on 200 bid at her last visit but went to MFM for an u/s by accident last week (no u/s done and r/s for today) and BP elevated so put on 300 bid. Last week she states she had BP check and still high so put  on 400 bid. Will increase her to labetalol 500 bid and see back in one week for RN BP check. Patient denies any s/s of pre-eclampsia and precautions given. F/u u/s from today. Baby ASA Rx given -growth scan q month needs 2x/week testing starting at around 28wks  2. Morbid obesity, unspecified obesity type (HCC) No change in plan of care  3. Supervision of high risk pregnancy, antepartum, second trimester 28wk labs today  Preterm labor symptoms and general obstetric precautions including but not limited to vaginal bleeding, contractions, leaking of fluid and fetal movement were reviewed in detail with the patient. Please refer to After Visit Summary for other counseling recommendations.   Return in about 2 weeks (around 04/24/2016).   [place] Bingharlie Tashima Scarpulla, MD

## 2016-04-10 NOTE — ED Notes (Signed)
[facility]  Pt to MAU for evaluation.

## 2016-04-11 ENCOUNTER — Encounter (HOSPITAL_COMMUNITY): Payer: Self-pay

## 2016-04-11 ENCOUNTER — Encounter: Payer: Self-pay | Admitting: Obstetrics and Gynecology

## 2016-04-11 DIAGNOSIS — O132 Gestational [pregnancy-induced] hypertension without significant proteinuria, second trimester: Secondary | ICD-10-CM | POA: Diagnosis not present

## 2016-04-11 DIAGNOSIS — D649 Anemia, unspecified: Secondary | ICD-10-CM | POA: Diagnosis present

## 2016-04-11 DIAGNOSIS — R51 Headache: Secondary | ICD-10-CM | POA: Diagnosis present

## 2016-04-11 DIAGNOSIS — Z6841 Body Mass Index (BMI) 40.0 and over, adult: Secondary | ICD-10-CM | POA: Diagnosis not present

## 2016-04-11 DIAGNOSIS — R7989 Other specified abnormal findings of blood chemistry: Secondary | ICD-10-CM | POA: Diagnosis present

## 2016-04-11 DIAGNOSIS — Z3A26 26 weeks gestation of pregnancy: Secondary | ICD-10-CM | POA: Diagnosis not present

## 2016-04-11 DIAGNOSIS — Z833 Family history of diabetes mellitus: Secondary | ICD-10-CM | POA: Diagnosis not present

## 2016-04-11 DIAGNOSIS — O99012 Anemia complicating pregnancy, second trimester: Secondary | ICD-10-CM | POA: Diagnosis present

## 2016-04-11 DIAGNOSIS — O10012 Pre-existing essential hypertension complicating pregnancy, second trimester: Secondary | ICD-10-CM | POA: Diagnosis present

## 2016-04-11 DIAGNOSIS — O99019 Anemia complicating pregnancy, unspecified trimester: Secondary | ICD-10-CM | POA: Insufficient documentation

## 2016-04-11 DIAGNOSIS — O99212 Obesity complicating pregnancy, second trimester: Secondary | ICD-10-CM | POA: Diagnosis present

## 2016-04-11 LAB — RAPID URINE DRUG SCREEN, HOSP PERFORMED
Amphetamines: NOT DETECTED
Barbiturates: NOT DETECTED
Benzodiazepines: NOT DETECTED
Cocaine: NOT DETECTED
Opiates: NOT DETECTED
Tetrahydrocannabinol: NOT DETECTED

## 2016-04-11 LAB — GLUCOSE TOLERANCE, 1 HOUR (50G) W/O FASTING: Glucose, 1 Hr, gestational: 114 mg/dL (ref <140)

## 2016-04-11 LAB — HIV ANTIBODY (ROUTINE TESTING W REFLEX): HIV 1&2 Ab, 4th Generation: NONREACTIVE

## 2016-04-11 MED ORDER — AMLODIPINE BESYLATE 10 MG PO TABS
10.0000 mg | ORAL_TABLET | Freq: Two times a day (BID) | ORAL | Status: DC
  Administered 2016-04-11 – 2016-04-13 (×5): 10 mg via ORAL
  Filled 2016-04-11 (×6): qty 1

## 2016-04-11 MED ORDER — LACTATED RINGERS IV SOLN
INTRAVENOUS | Status: DC
  Administered 2016-04-10 – 2016-04-12 (×3): via INTRAVENOUS

## 2016-04-11 MED ORDER — AMLODIPINE BESYLATE 10 MG PO TABS
10.0000 mg | ORAL_TABLET | Freq: Once | ORAL | Status: AC
  Administered 2016-04-11: 10 mg via ORAL
  Filled 2016-04-11: qty 1

## 2016-04-11 MED ORDER — AMLODIPINE BESYLATE 10 MG PO TABS
10.0000 mg | ORAL_TABLET | Freq: Every day | ORAL | Status: DC
  Filled 2016-04-11: qty 1

## 2016-04-11 NOTE — Progress Notes (Signed)
I offered support to pt and her family (grandmother and aunt).  She reports feeling very supported by her family and although she is scared of the possibility of early delivery if her blood pressure does not remain stable, she knows that she and her baby, Patty Gilmore, will be well cared for both by the hospital and by her family.  She is coping okay for the moment as her blood pressure is more stable today.  She is aware of our ongoing availability if needs arise over her hospital stay.  Chaplain Dyanne CarrelKaty Zakirah Weingart, Bcc Pager, [phone] 1:52 PM    04/11/16 1300  Clinical Encounter Type  Visited With Patient and family together  Visit Type Spiritual support  Referral From Nurse  Spiritual Encounters  Spiritual Needs Emotional

## 2016-04-11 NOTE — Progress Notes (Signed)
Patient ID: Patty Gilmore, female   DOB: 27-Nov-1990, 25 y.o.   MRN: [id number] [facility] ANTEPARTUM COMPREHENSIVE PROGRESS NOTE  Patty Gilmore is a 25 y.o. G1P0 at [redacted]w[redacted]d  who is admitted for uncontrolled chronic hypertension    Fetal presentation is variable. Length of Stay:  0  Days  Subjective: Pt with no complaints.  She denies HA. Patient reports good fetal movement.  She reports no uterine contractions, no bleeding and no loss of fluid per vagina.  Vitals:  Blood pressure 137/82, pulse 100, temperature 98.9 F (37.2 C), temperature source Oral, resp. rate 18, height 5\' 5"  (1.651 m), weight 223 lb (101.152 kg), last menstrual period 10/11/2015, SpO2 94 %. Physical Examination: General appearance - alert, well appearing, and in no distress Chest - clear to auscultation, no wheezes, rales or rhonchi, symmetric air entry Heart - normal rate, regular rhythm, normal S1, S2, no murmurs, rubs, clicks or gallops Abdomen - soft, nontender, nondistended, no masses or organomegaly gravid Extremities - peripheral pulses normal, no pedal edema, no clubbing or cyanosis Cervical Exam: Not evaluated.  Membranes:intact  Fetal Monitoring:  Baseline: 120's bpm, Variability: Fair (1-6 bpm), Accelerations: Non-reactive but appropriate for gestational age and Decelerations: Absent  Labs:  Results for orders placed or performed during the hospital encounter of 04/10/16 (from the past 24 hour(s))  CBC   Collection Time: 04/10/16  1:21 PM  Result Value Ref Range   WBC 8.1 4.0 - 10.5 K/uL   RBC 3.00 (L) 3.87 - 5.11 MIL/uL   Hemoglobin 9.5 (L) 12.0 - 15.0 g/dL   HCT 69.6 (L) 29.5 - 28.4 %   MCV 93.0 78.0 - 100.0 fL   MCH 31.7 26.0 - 34.0 pg   MCHC 34.1 30.0 - 36.0 g/dL   RDW 13.2 44.0 - 10.2 %   Platelets 240 150 - 400 K/uL  Comprehensive metabolic panel   Collection Time: 04/10/16  1:21 PM  Result Value Ref Range   Sodium 134 (L) 135 - 145 mmol/L   Potassium 4.9 3.5 - 5.1 mmol/L   Chloride 107 101 - 111 mmol/L   CO2 21 (L) 22 - 32 mmol/L   Glucose, Bld 74 65 - 99 mg/dL   BUN 13 6 - 20 mg/dL   Creatinine, Ser 7.25 (H) 0.44 - 1.00 mg/dL   Calcium 9.2 8.9 - 36.6 mg/dL   Total Protein 7.4 6.5 - 8.1 g/dL   Albumin 3.5 3.5 - 5.0 g/dL   AST 26 15 - 41 U/L   ALT 24 14 - 54 U/L   Alkaline Phosphatase 69 38 - 126 U/L   Total Bilirubin 0.4 0.3 - 1.2 mg/dL   GFR calc non Af Amer >60 >60 mL/min   GFR calc Af Amer >60 >60 mL/min   Anion gap 6 5 - 15  Urinalysis, Routine w reflex microscopic (not at [facility])   Collection Time: 04/10/16  1:45 PM  Result Value Ref Range   Color, Urine YELLOW YELLOW   APPearance CLEAR CLEAR   Specific Gravity, Urine <1.005 (L) 1.005 - 1.030   pH 6.0 5.0 - 8.0   Glucose, UA NEGATIVE NEGATIVE mg/dL   Hgb urine dipstick NEGATIVE NEGATIVE   Bilirubin Urine NEGATIVE NEGATIVE   Ketones, ur NEGATIVE NEGATIVE mg/dL   Protein, ur NEGATIVE NEGATIVE mg/dL   Nitrite NEGATIVE NEGATIVE   Leukocytes, UA TRACE (A) NEGATIVE  Protein / creatinine ratio, urine   Collection Time: 04/10/16  1:45 PM  Result Value Ref Range  Creatinine, Urine 43.00 mg/dL   Total Protein, Urine 9 mg/dL   Protein Creatinine Ratio 0.21 (H) 0.00 - 0.15 mg/mg[Cre]  Urine microscopic-add on   Collection Time: 04/10/16  1:45 PM  Result Value Ref Range   Squamous Epithelial / LPF 0-5 (A) NONE SEEN   WBC, UA 0-5 0 - 5 WBC/hpf   RBC / HPF 0-5 0 - 5 RBC/hpf   Bacteria, UA FEW (A) NONE SEEN  Urine rapid drug screen (hosp performed)   Collection Time: 04/10/16  1:45 PM  Result Value Ref Range   Opiates NONE DETECTED NONE DETECTED   Cocaine NONE DETECTED NONE DETECTED   Benzodiazepines NONE DETECTED NONE DETECTED   Amphetamines NONE DETECTED NONE DETECTED   Tetrahydrocannabinol NONE DETECTED NONE DETECTED   Barbiturates NONE DETECTED NONE DETECTED  Results for orders placed or performed in visit on 04/10/16 (from the past 24 hour(s))  Glucose Tolerance, 1 HR (50g)   Collection  Time: 04/10/16  9:46 AM  Result Value Ref Range   Glucose, 1 Hr, gestational 114 <140 mg/dL  CBC   Collection Time: 04/10/16  9:46 AM  Result Value Ref Range   WBC 7.7 3.8 - 10.8 K/uL   RBC 2.91 (L) 3.80 - 5.10 MIL/uL   Hemoglobin 9.3 (L) 11.7 - 15.5 g/dL   HCT 16.127.1 (L) 09.635.0 - 04.545.0 %   MCV 93.1 80.0 - 100.0 fL   MCH 32.0 27.0 - 33.0 pg   MCHC 34.3 32.0 - 36.0 g/dL   RDW 40.912.9 81.111.0 - 91.415.0 %   Platelets 231 140 - 400 K/uL   MPV 9.0 7.5 - 12.5 fL  RPR   Collection Time: 04/10/16  9:46 AM  Result Value Ref Range   RPR Ser Ql  NON REAC  HIV antibody   Collection Time: 04/10/16  9:46 AM  Result Value Ref Range   HIV 1&2 Ab, 4th Generation NONREACTIVE NONREACTIVE  POCT urinalysis dip (device)   Collection Time: 04/10/16 10:19 AM  Result Value Ref Range   Glucose, UA NEGATIVE NEGATIVE mg/dL   Bilirubin Urine SMALL (A) NEGATIVE   Ketones, ur NEGATIVE NEGATIVE mg/dL   Specific Gravity, Urine >=1.030 1.005 - 1.030   Hgb urine dipstick NEGATIVE NEGATIVE   pH 6.0 5.0 - 8.0   Protein, ur 100 (A) NEGATIVE mg/dL   Urobilinogen, UA 0.2 0.0 - 1.0 mg/dL   Nitrite NEGATIVE NEGATIVE   Leukocytes, UA NEGATIVE NEGATIVE    Imaging Studies:    From 7/10  EFW: 28th%ile; adequate fluid  Medications:  Scheduled . amLODipine  10 mg Oral BID  . betamethasone acetate-betamethasone sodium phosphate  12 mg Intramuscular Q24 Hr x 2  . docusate sodium  100 mg Oral Daily  . labetalol  600 mg Oral TID  . magnesium  6 g Intravenous Once  . prenatal multivitamin  1 tablet Oral Q1200   I have reviewed the patient's current medications.  ASSESSMENT: Patient Active Problem List   Diagnosis Date Noted  . Gestational hypertension w/o significant proteinuria in 2nd trimester 04/10/2016  . Creatinine elevation 04/10/2016  . 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 Supervision of high risk pregnancy, antepartum 01/12/2016  . HTN in pregnancy, chronic 01/12/2016  . Pseudotumor cerebri 07/31/2014  .  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 Severe obesity (BMI >= 40) (HCC) 06/22/2014  . Acanthosis nigricans 01/16/2012  . LVH (left ventricular hypertrophy) 01/16/2012    PLAN: Norvasc 10mg  bid Keep Magnesium sulfate for now Labetalol 600mg  tid  Continue  routine antenatal care. May need to add further agents to keep BP 140's/90's   HARRAWAY-SMITH, Pansie Guggisberg 04/11/2016,6:58 AM

## 2016-04-12 LAB — PROTEIN, URINE, 24 HOUR
Collection Interval-UPROT: 24 hours
Protein, 24H Urine: 150 mg/d — ABNORMAL HIGH (ref 50–100)
Protein, Urine: 15 mg/dL
Urine Total Volume-UPROT: 1000 mL

## 2016-04-12 LAB — RPR

## 2016-04-12 MED ORDER — LABETALOL HCL 100 MG PO TABS
800.0000 mg | ORAL_TABLET | Freq: Three times a day (TID) | ORAL | Status: DC
  Administered 2016-04-12 – 2016-04-13 (×4): 800 mg via ORAL
  Filled 2016-04-12 (×4): qty 2

## 2016-04-12 NOTE — Progress Notes (Signed)
Patient ID: Patty Gilmore, female   DOB: 1991/08/30, 25 y.o.   MRN: [id number] [place] COMPREHENSIVE PROGRESS NOTE  Patty Gilmore is a 25 y.o. G1P0 at [redacted]w[redacted]d who is admitted for severe range chronic hypertension    Fetal presentation is variable. Length of Stay:  1  Days  Subjective: Pt with no complaints.  She denies HA, visual symptoms, RUQ/epigastric pain. Patient reports good fetal movement.  She reports no uterine contractions, no bleeding and no loss of fluid per vagina.  Vitals:  Blood pressure 151/92, pulse 95, temperature 98.2 F (36.8 C), temperature source Oral, resp. rate 18, height 5' 5" (1.651 m), weight 223 lb (101.152 kg), last menstrual period 10/11/2015, SpO2 95 %.  Temp:  [98.2 F (36.8 C)-98.5 F (36.9 C)] 98.2 F (36.8 C) (07/12 0821) Pulse Rate:  [83-97] 95 (07/12 0821) Resp:  [16-18] 18 (07/12 0821) BP: (125-151)/(67-92) 151/92 mmHg (07/12 0821) SpO2:  [95 %] 95 % (07/11 1000)  Physical Examination: General appearance - alert, well appearing, and in no distress Chest - clear to auscultation, no wheezes, rales or rhonchi, symmetric air entry Heart - normal rate, regular rhythm, normal S1, S2, no murmurs, rubs, clicks or gallops Abdomen - soft, gravid, nontender, nondistended, no masses or organomegaly Extremities - peripheral pulses normal, no pedal edema, no clubbing or cyanosis Cervical Exam: Not evaluated.  Membranes:intact  Fetal Monitoring:  Baseline: 120's bpm, Variability: Fair (1-6 bpm), Accelerations: Non-reactive but appropriate for gestational age and Decelerations: Absent  Labs:   Results for orders placed or performed during the hospital encounter of 04/10/16 (from the past 48 hour(s))  CBC     Status: Abnormal   Collection Time: 04/10/16  1:21 PM  Result Value Ref Range   WBC 8.1 4.0 - 10.5 K/uL   RBC 3.00 (L) 3.87 - 5.11 MIL/uL   Hemoglobin 9.5 (L) 12.0 - 15.0 g/dL   HCT 27.9 (L) 36.0 - 46.0 %   MCV 93.0 78.0 -  100.0 fL   MCH 31.7 26.0 - 34.0 pg   MCHC 34.1 30.0 - 36.0 g/dL   RDW 13.1 11.5 - 15.5 %   Platelets 240 150 - 400 K/uL  Comprehensive metabolic panel     Status: Abnormal   Collection Time: 04/10/16  1:21 PM  Result Value Ref Range   Sodium 134 (L) 135 - 145 mmol/L   Potassium 4.9 3.5 - 5.1 mmol/L   Chloride 107 101 - 111 mmol/L   CO2 21 (L) 22 - 32 mmol/L   Glucose, Bld 74 65 - 99 mg/dL   BUN 13 6 - 20 mg/dL   Creatinine, Ser 1.14 (H) 0.44 - 1.00 mg/dL   Calcium 9.2 8.9 - 10.3 mg/dL   Total Protein 7.4 6.5 - 8.1 g/dL   Albumin 3.5 3.5 - 5.0 g/dL   AST 26 15 - 41 U/L   ALT 24 14 - 54 U/L   Alkaline Phosphatase 69 38 - 126 U/L   Total Bilirubin 0.4 0.3 - 1.2 mg/dL   GFR calc non Af Amer >60 >60 mL/min   GFR calc Af Amer >60 >60 mL/min    Comment: (NOTE) The eGFR has been calculated using the CKD EPI equation. This calculation has not been validated in all clinical situations. eGFR's persistently <60 mL/min signify possible Chronic Kidney Disease.    Anion gap 6 5 - 15  [facility]inalysis, [facility]     Status: Abnormal   Collection  Time: 04/10/16  1:45 PM  Result Value Ref Range   Color, Urine YELLOW YELLOW   APPearance CLEAR CLEAR   Specific Gravity, Urine <1.005 (L) 1.005 - 1.030   pH 6.0 5.0 - 8.0   Glucose, UA NEGATIVE NEGATIVE mg/dL   Hgb urine dipstick NEGATIVE NEGATIVE   Bilirubin Urine NEGATIVE NEGATIVE   Ketones, ur NEGATIVE NEGATIVE mg/dL   Protein, ur NEGATIVE NEGATIVE mg/dL   Nitrite NEGATIVE NEGATIVE   Leukocytes, UA TRACE (A) NEGATIVE  Protein / creatinine ratio, urine     Status: Abnormal   Collection Time: 04/10/16  1:45 PM  Result Value Ref Range   Creatinine, Urine 43.00 mg/dL   Total Protein, Urine 9 mg/dL    Comment: NO NORMAL RANGE ESTABLISHED FOR THIS TEST   Protein Creatinine Ratio 0.21 (H) 0.00 - 0.15 mg/mg[Cre]  Urine microscopic-add on     Status: Abnormal   Collection Time: 04/10/16  1:45 PM  Result Value Ref  Range   Squamous Epithelial / LPF 0-5 (A) NONE SEEN   WBC, UA 0-5 0 - 5 WBC/hpf   RBC / HPF 0-5 0 - 5 RBC/hpf   Bacteria, UA FEW (A) NONE SEEN  Urine rapid drug screen (hosp performed)     Status: None   Collection Time: 04/10/16  1:45 PM  Result Value Ref Range   Opiates NONE DETECTED NONE DETECTED   Cocaine NONE DETECTED NONE DETECTED   Benzodiazepines NONE DETECTED NONE DETECTED   Amphetamines NONE DETECTED NONE DETECTED   Tetrahydrocannabinol NONE DETECTED NONE DETECTED   Barbiturates NONE DETECTED NONE DETECTED    Comment:        DRUG SCREEN FOR MEDICAL PURPOSES ONLY.  IF CONFIRMATION IS NEEDED FOR ANY PURPOSE, NOTIFY LAB WITHIN 5 DAYS.        LOWEST DETECTABLE LIMITS FOR URINE DRUG SCREEN Drug Class       Cutoff (ng/mL) Amphetamine      1000 Barbiturate      200 Benzodiazepine   127 Tricyclics       517 Opiates          300 Cocaine          300 THC              50   Protein, urine, 24 hour     Status: Abnormal   Collection Time: 04/11/16  2:07 AM  Result Value Ref Range   Urine Total Volume-UPROT 1000 mL   Collection Interval-UPROT 24 hours   Protein, Urine 15 mg/dL   Protein, 24H Urine 150 (H) 50 - 100 mg/day    Comment: Performed at [facility]    Imaging Studies:    From 7/10  EFW: 28th%ile; adequate fluid  Medications:  Scheduled . amLODipine  10 mg Oral BID  . docusate sodium  100 mg Oral Daily  . labetalol  600 mg Oral TID  . magnesium  6 g Intravenous Once  . prenatal multivitamin  1 tablet Oral Q1200   I have reviewed the patient's current medications.  ASSESSMENT: Patient Active Problem List   Diagnosis Date Noted  . Anemia in pregnancy 04/11/2016  . Gestational hypertension w/o significant proteinuria in 2nd trimester 04/10/2016  . Creatinine elevation 04/10/2016  . 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 Supervision of high risk pregnancy, antepartum 01/12/2016  . HTN in pregnancy, chronic 01/12/2016  . Pseudotumor cerebri  07/31/2014  . 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 Severe obesity (BMI >= 40) ([place]) 06/22/2014  .  Acanthosis nigricans 01/16/2012  . LVH (left ventricular hypertrophy) 01/16/2012  No evidence of superimposed preeclampsia for now  PLAN: Norvasc 60m bid Increased Labetalol to [id number] for better BP control May need to add further agents to keep BP around 140's/90's Continue routine antenatal care. May discharge home tomorrow if remains stable.   ANYANWU,UGONNA A, MD 04/12/2016,8:59 AM

## 2016-04-13 MED ORDER — LABETALOL HCL 200 MG PO TABS: 800.0000 mg | ORAL_TABLET | Freq: Three times a day (TID) | ORAL | Status: DC

## 2016-04-13 MED ORDER — AMLODIPINE BESYLATE 10 MG PO TABS: 10.0000 mg | ORAL_TABLET | Freq: Two times a day (BID) | ORAL | Status: DC

## 2016-04-13 NOTE — Discharge Instructions (Signed)
Increase your labetalol to  three times a day.  Add norvasc(amlodipine) 10 twice a day  Hypertension During Pregnancy Hypertension, or high blood pressure, is when there is extra pressure inside your blood vessels that carry blood from the heart to the rest of your body (arteries). It can happen at any time in life, including pregnancy. Hypertension during pregnancy can cause problems for you and your baby. Your baby might not weigh as much as he or she should at birth or might be born early (premature). Very bad cases of hypertension during pregnancy can be life-threatening.  Different types of hypertension can occur during pregnancy. These include:  Chronic hypertension. This happens when a woman has hypertension before pregnancy and it continues during pregnancy.  Gestational hypertension. This is when hypertension develops during pregnancy.  Preeclampsia or toxemia of pregnancy. This is a very serious type of hypertension that develops only during pregnancy. It affects the whole body and can be very dangerous for both mother and baby.  Gestational hypertension and preeclampsia usually go away after your baby is born. Your blood pressure will likely stabilize within 6 weeks. Women who have hypertension during pregnancy have a greater chance of developing hypertension later in life or with future pregnancies. RISK FACTORS There are certain factors that make it more likely for you to develop hypertension during pregnancy. These include:  Having hypertension before pregnancy.  Having hypertension during a previous pregnancy.  Being overweight.  Being older than 40 years.  Being pregnant with more than one baby.  Having diabetes or kidney problems. SIGNS AND SYMPTOMS Chronic and gestational hypertension rarely cause symptoms. Preeclampsia has symptoms, which may include:  Increased protein in your urine. Your health care provider will check for this at every prenatal  visit.  Swelling of your hands and face.  Rapid weight gain.  Headaches.  Visual changes.  Being bothered by light.  Abdominal pain, especially in the upper right area.  Chest pain.  Shortness of breath.  Increased reflexes.  Seizures. These occur with a more severe form of preeclampsia, called eclampsia. DIAGNOSIS  You may be diagnosed with hypertension during a regular prenatal exam. At each prenatal visit, you may have:  Your blood pressure checked.  A urine test to check for protein in your urine. The type of hypertension you are diagnosed with depends on when you developed it. It also depends on your specific blood pressure reading.  Developing hypertension before 20 weeks of pregnancy is consistent with chronic hypertension.  Developing hypertension after 20 weeks of pregnancy is consistent with gestational hypertension.  Hypertension with increased urinary protein is diagnosed as preeclampsia.  Blood pressure measurements that stay above 160 systolic or 110 diastolic are a sign of severe preeclampsia. TREATMENT Treatment for hypertension during pregnancy varies. Treatment depends on the type of hypertension and how serious it is.  If you take medicine for chronic hypertension, you may need to switch medicines.  Medicines called ACE inhibitors should not be taken during pregnancy.  Low-dose aspirin may be suggested for women who have risk factors for preeclampsia.  If you have gestational hypertension, you may need to take a blood pressure medicine that is safe during pregnancy. Your health care provider will recommend the correct medicine.  If you have severe preeclampsia, you may need to be in the hospital. Health care providers will watch you and your baby very closely. You also may need to take medicine called magnesium sulfate to prevent seizures and lower blood pressure.  Sometimes,  an early delivery is needed. This may be the case if the condition  worsens. It would be done to protect you and your baby. The only cure for preeclampsia is delivery.  Your health care provider may recommend that you take one low-dose aspirin (81 mg) each day to help prevent high blood pressure during your pregnancy if you are at risk for preeclampsia. You may be at risk for preeclampsia if:  You had preeclampsia or eclampsia during a previous pregnancy.  Your baby did not grow as expected during a previous pregnancy.  You experienced preterm birth with a previous pregnancy.  You experienced a separation of the placenta from the uterus (placental abruption) during a previous pregnancy.  You experienced the loss of your baby during a previous pregnancy.  You are pregnant with more than one baby.  You have other medical conditions, such as diabetes or an autoimmune disease. HOME CARE INSTRUCTIONS  Schedule and keep all of your regular prenatal care appointments. This is important.  Take medicines only as directed by your health care provider. Tell your health care provider about all medicines you take.  Eat as little salt as possible.  Get regular exercise.  Do not drink alcohol.  Do not use tobacco products.  Do not drink products with caffeine.  Lie on your left side when resting. SEEK IMMEDIATE MEDICAL CARE IF:  You have severe abdominal pain.  You have sudden swelling in your hands, ankles, or face.  You gain 4 pounds (1.8 kg) or more in 1 week.  You vomit repeatedly.  You have vaginal bleeding.  You do not feel your baby moving as much.  You have a headache.  You have blurred or double vision.  You have muscle twitching or spasms.  You have shortness of breath.  You have blue fingernails or lips.  You have blood in your urine. MAKE SURE YOU:  Understand these instructions.  Will watch your condition.  Will get help right away if you are not doing well or get worse.   This information is not intended to replace  advice given to you by your health care provider. Make sure you discuss any questions you have with your health care provider.   Document Released: 06/06/2011 Document Revised: 10/09/2014 Document Reviewed: 04/17/2013 Elsevier Interactive Patient Education [employer]2016 [employer] Inc.

## 2016-04-13 NOTE — Progress Notes (Signed)
Discharge instructions reviewed with patient. Patient verbalized an understanding of discharge instructions. Discharged ambulatory home with family.

## 2016-04-13 NOTE — Progress Notes (Signed)
Patient ID: Patty Bubaelphelia V Roser, female   DOB: 11/06/1990, 25 y.o.   MRN: [id number] Patient ID: Patty BubaDelphelia V Opara, female   DOB: 12/25/1990, 25 y.o.   MRN: [id number] [facility]TE[facility] COMPREHENSIVE PROGRESS NOTE  Patty Gilmore is a 25 y.o. G1P0 at 1418w3d   who is admitted for severe range chronic hypertension    Fetal presentation is variable. Length of Stay:  2  Days  Subjective: Pt with no complaints.  She denies HA, visual symptoms, RUQ/epigastric pain. Patient reports good fetal movement.  She reports no uterine contractions, no bleeding and no loss of fluid per vagina.  Vitals:  Blood pressure 128/68, pulse 82, temperature 98.1 F (36.7 C), temperature source Oral, resp. rate 18, height 5\' 5"  (1.651 m), weight 230 lb 1.6 oz (104.373 kg), last menstrual period 10/11/2015, SpO2 95 %.  Temp:  [97.6 F (36.4 C)-98.3 F (36.8 C)] 98.1 F (36.7 C) (07/13 0009) Pulse Rate:  [82-95] 82 (07/13 0009) Resp:  [18] 18 (07/13 0009) BP: (128-158)/(68-92) 128/68 mmHg (07/13 0009) Weight:  [230 lb 1.6 oz (104.373 kg)] 230 lb 1.6 oz (104.373 kg) (07/12 0930)  Physical Examination: General appearance - alert, well appearing, and in no distress Chest - clear to auscultation, no wheezes, rales or rhonchi, symmetric air entry Heart - normal rate, regular rhythm, normal S1, S2, no murmurs, rubs, clicks or gallops Abdomen - soft, gravid, nontender, nondistended, no masses or organomegaly Extremities - peripheral pulses normal, no pedal edema, no clubbing or cyanosis Cervical Exam: Not evaluated.  Membranes:intact  Fetal Monitoring:  Baseline: 120's bpm, Variability: Fair (1-6 bpm), Accelerations: Non-reactive but appropriate for gestational age and Decelerations: Absent  Labs:   No results found for this or any previous visit (from the past 48 hour(s)).  Imaging Studies:    From 7/10  EFW: 28th%ile; adequate fluid  Medications:  Scheduled . amLODipine  10 mg Oral BID  . docusate sodium   100 mg Oral Daily  . labetalol  800 mg Oral TID  . magnesium  6 g Intravenous Once  . prenatal multivitamin  1 tablet Oral Q1200   I have reviewed the patient's current medications.  ASSESSMENT: Patient Active Problem List   Diagnosis Date Noted  . Anemia in pregnancy 04/11/2016  . Chronic hypertension during pregnancy, antepartum 04/10/2016  . Creatinine elevation 04/10/2016  . 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 Supervision of high risk pregnancy, antepartum 01/12/2016  . HTN in pregnancy, chronic 01/12/2016  . Pseudotumor cerebri 07/31/2014  . 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 Severe obesity (BMI >= 40) ([facility]) 06/22/2014  . Acanthosis nigricans 01/16/2012  . LVH (left ventricular hypertrophy) 01/16/2012  No evidence of superimposed preeclampsia for now  PLAN: Norvasc 10mg  bid Increased Labetalol to 800mg  tid for better BP control May need to add further agents to keep BP around 140's/90's Continue routine antenatal care.  Pt feels good and BP under good control, will discharge home[facility]  Pt aware of BP med changes and importance of taking as prescribed now   Lazaro ArmsEURE,Sheila Ocasio H, MD 04/13/2016,7:48 AM

## 2016-04-13 NOTE — Discharge Summary (Signed)
OB Discharge Summary     Patient Name: Patty Gilmore DOB: [birth year] MRN: [id number]  Date of admission: 04/10/2016 Delivering MD: This patient has no babies on file.  Date of discharge: 04/13/2016  Admitting diagnosis: 26WKS BLOOD PRESSURE IS TOO HIGH Intrauterine pregnancy: [redacted]w[redacted]d     Secondary diagnosis:  Active Problems:   Chronic hypertension during pregnancy, antepartum   Creatinine elevation  Additional problems:      Discharge diagnosis: chronic hypertension in second trimester requiring in hospital management                                                                                                Post partum procedures:  Augmentation:   Complications:   Hospital course:    [facility] ANTEPARTUM COMPREHENSIVE PROGRESS NOTE  Patty Gilmore is a 25 y.o. G1P0 at [redacted]w[redacted]d who is admitted for severe range chronic hypertension   Fetal presentation is variable. Length of Stay: 2 Days  Subjective: Pt with no complaints. She denies HA, visual symptoms, RUQ/epigastric pain. Patient reports good fetal movement. She reports no uterine contractions, no bleeding and no loss of fluid per vagina.  Vitals: Blood pressure 128/68, pulse 82, temperature 98.1 F (36.7 C), temperature source Oral, resp. rate 18, height  (1.651 m), weight 230 lb 1.6 oz (104.373 kg), last menstrual period 10/11/2015, SpO2 95 %.  Temp: [97.6 F (36.4 C)-98.3 F (36.8 C)] 98.1 F (36.7 C) (07/13 0009) Pulse Rate: [82-95] 82 (07/13 0009) Resp: [18] 18 (07/13 0009) BP: (128-158)/(68-92) 128/68 mmHg (07/13 0009) Weight: [230 lb 1.6 oz (104.373 kg)] 230 lb 1.6 oz (104.373 kg) (07/12 0930)  Physical Examination: General appearance - alert, well appearing, and in no distress Chest - clear to auscultation, no wheezes, rales or rhonchi, symmetric air entry Heart - normal rate, regular rhythm, normal S1, S2, no murmurs, rubs, clicks or gallops Abdomen - soft, gravid,  nontender, nondistended, no masses or organomegaly Extremities - peripheral pulses normal, no pedal edema, no clubbing or cyanosis Cervical Exam: Not evaluated.  Membranes:intact  Fetal Monitoring: Baseline: 120's bpm, Variability: Fair (1-6 bpm), Accelerations: Non-reactive but appropriate for gestational age and Decelerations: Absent  Labs:    Lab Results Last 48 Hours    No results found for this or any previous visit (from the past 48 hour(s)).    Imaging Studies:  From 7/10 EFW: 28th%ile; adequate fluid  Medications: Scheduled . amLODipine 10 mg Oral BID  . docusate sodium 100 mg Oral Daily  . labetalol 800 mg Oral TID  . magnesium 6 g Intravenous Once  . prenatal multivitamin 1 tablet Oral Q1200   I have reviewed the patient's current medications.  ASSESSMENT: Patient Active Problem List   Diagnosis Date Noted  . Anemia in pregnancy 04/11/2016  . Chronic hypertension during pregnancy, antepartum 04/10/2016  . Creatinine elevation 04/10/2016  . 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 Supervision of high risk pregnancy, antepartum 01/12/2016  . HTN in pregnancy, chronic 01/12/2016  . Pseudotumor cerebri 07/31/2014  . 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 Severe obesity (BMI >= 40) ([facility]) 06/22/2014  .  Acanthosis nigricans 01/16/2012  . LVH (left ventricular hypertrophy) 01/16/2012  No evidence of superimposed preeclampsia for now  PLAN: Norvasc 10mg  bid Increased Labetalol to 800mg  tid for better BP control May need to add further agents to keep BP around 140's/90's Continue routine antenatal care.  Pt feels good and BP under good control, will discharge home[facility]  Pt aware of BP med changes and importance of taking as prescribed now Pt received betamethasone x 2 in house  Lazaro ArmsEURE,LUTHER H, MD 04/13/2016,7:48 AM            Physical exam  Filed Vitals:   04/12/16  1947 04/12/16 2135 04/12/16 2137 04/13/16 0009  BP: 137/76 128/76 128/76 128/68  Pulse: 94 87 87 82  Temp: 98.3 F (36.8 C)   98.1 F (36.7 C)  TempSrc: Oral   Oral  Resp: 18 18 18 18   Height:      Weight:      SpO2:       General: alert, cooperative and no distress Lochia:  Uterine Fundus:  Incision:  DVT Evaluation: No evidence of DVT seen on physical exam. Labs: Lab Results  Component Value Date   WBC 8.1 04/10/2016   HGB 9.5* 04/10/2016   HCT 27.9* 04/10/2016   MCV 93.0 04/10/2016   PLT 240 04/10/2016   CMP Latest Ref Rng 04/10/2016  Glucose 65 - 99 mg/dL 74  BUN 6 - 20 mg/dL 13  Creatinine 1.610.44 - 0.961.00 mg/dL 0.45(W1.14(H)  Sodium 098135 - 119145 mmol/L 134(L)  Potassium 3.5 - 5.1 mmol/L 4.9  Chloride 101 - 111 mmol/L 107  CO2 22 - 32 mmol/L 21(L)  Calcium 8.9 - 10.3 mg/dL 9.2  Total Protein 6.5 - 8.1 g/dL 7.4  Total Bilirubin 0.3 - 1.2 mg/dL 0.4  Alkaline Phos 38 - 126 U/L 69  AST 15 - 41 U/L 26  ALT 14 - 54 U/L 24    Discharge instruction: per After Visit Summary and "Baby and Me Booklet".  After visit meds:    Medication List    STOP taking these medications        ondansetron 4 MG tablet  Commonly known as:  ZOFRAN      TAKE these medications        acetaminophen 325 MG tablet  Commonly known as:  TYLENOL  Take 650 mg by mouth every 6 (six) hours as needed for headache. Reported on 01/13/2016     amLODipine 10 MG tablet  Commonly known as:  NORVASC  Take 1 tablet (10 mg total) by mouth 2 (two) times daily.     aspirin EC 81 MG tablet  Take 1 tablet (81 mg total) by mouth daily.     labetalol 300 MG tablet  Commonly known as:  NORMODYNE  Take 1 tablet (300 mg total) by mouth 3 (three) times daily.     labetalol 200 MG tablet  Commonly known as:  NORMODYNE  Take 4 tablets (800 mg total) by mouth 3 (three) times daily.     prenatal multivitamin Tabs tablet  Take 1 tablet by mouth daily at 12 noon.        Diet: routine diet  Activity: Advance as  tolerated. Pelvic rest for 6 weeks.   Outpatient follow up:in 4 days as scheduled Follow up Appt:Future Appointments Date Time Provider Department Center  04/17/2016 10:45 AM [facility] NURSE [facility]  04/24/2016 2:40 PM Dorathy KinsmanVirginia Smith, CNM [facility]  05/08/2016 10:30 AM [facility] [place]  1 [facility]   Follow up Visit:No Follow-up on file.  Postpartum contraception:   Newborn Data: This patient has no babies on file. Baby Feeding:  Disposition:   04/13/2016 Lazaro Arms, MD

## 2016-04-17 ENCOUNTER — Encounter: Payer: Self-pay | Admitting: Family Medicine

## 2016-04-17 ENCOUNTER — Ambulatory Visit: Payer: Medicaid Other

## 2016-04-17 VITALS — BP 133/88 | HR 83

## 2016-04-17 DIAGNOSIS — Z0130 Encounter for examination of blood pressure without abnormal findings: Secondary | ICD-10-CM

## 2016-04-17 NOTE — Progress Notes (Signed)
Patient presented to clinic today for blood pressure check. She was admitted on last week for elevated blood pressure. Patient stated she is feeling ok today. She does have some swelling in feet at this time. No headache, blurred vision or seeing spots. Patient was advised to continued to taking medication as prescribed. She will follow up on 04/24/2016 at next appointment.

## 2016-04-24 ENCOUNTER — Ambulatory Visit (INDEPENDENT_AMBULATORY_CARE_PROVIDER_SITE_OTHER): Payer: Medicaid Other | Admitting: Family Medicine

## 2016-04-24 VITALS — BP 124/77 | HR 67 | Wt 215.7 lb

## 2016-04-24 DIAGNOSIS — O10913 Unspecified pre-existing hypertension complicating pregnancy, third trimester: Secondary | ICD-10-CM | POA: Diagnosis present

## 2016-04-24 DIAGNOSIS — Z23 Encounter for immunization: Secondary | ICD-10-CM | POA: Diagnosis not present

## 2016-04-24 DIAGNOSIS — R87622 Low grade squamous intraepithelial lesion on cytologic smear of vagina (LGSIL): Secondary | ICD-10-CM

## 2016-04-24 DIAGNOSIS — O0993 Supervision of high risk pregnancy, unspecified, third trimester: Secondary | ICD-10-CM

## 2016-04-24 DIAGNOSIS — O99213 Obesity complicating pregnancy, third trimester: Secondary | ICD-10-CM

## 2016-04-24 LAB — POCT URINALYSIS DIP (DEVICE)
Bilirubin Urine: NEGATIVE
Glucose, UA: NEGATIVE mg/dL
Hgb urine dipstick: NEGATIVE
Leukocytes, UA: NEGATIVE
Nitrite: NEGATIVE
Protein, ur: 30 mg/dL — AB
Specific Gravity, Urine: >=1.03 (ref 1.005–1.030)
Urobilinogen, UA: 0.2 mg/dL (ref 0.0–1.0)
pH: 5.5 (ref 5.0–8.0)

## 2016-04-24 NOTE — Progress Notes (Signed)
Subjective:    Patty Gilmore is a 25 y.o. female being seen today for her obstetrical visit. She is at [redacted]w[redacted]d gestation. Patient reports no complaints. Fetal movement: normal.  Denies signs/symptoms of pre-eclampsia: changes in vision, HAs, eipgastric/RUQ pain, significant edema, scitomas. Home BPs 130-150s/80-90s.   Review of Systems:   Review of Systems  Review of Systems - General ROS: negative   Objective:    BP 124/77   Pulse 67   Wt 215 lb 11.2 oz (97.8 kg)   LMP 10/11/2015   BMI 35.89 kg/m   Physical Exam  Exam  FHT:  120 BPM  Uterine Size: size less than dates  Presentation: unsure  Cardiovascular:  RRR, no m/r/g, +S1/S2  Respiratory:   CTAB, no wheezes, rhales, rhonchi     Assessment:    Pregnancy:  G1P0 at 28 0/7 weeks with chronic HTN, pseudotumor cerebri, LGSIL, LVH, morbid obesity.    Plan:    Patient Active Problem List   Diagnosis Date Noted  . Anemia in pregnancy 04/11/2016  . Creatinine elevation 04/10/2016  . 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 Supervision of high risk pregnancy, antepartum 01/12/2016  . HTN in pregnancy, chronic 01/12/2016  . Pseudotumor cerebri 07/31/2014  . 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 Severe obesity (BMI >= 40) (HCC) 06/22/2014  . Acanthosis nigricans 01/16/2012  . LVH (left ventricular hypertrophy) 01/16/2012   1. Supervision of high risk pregnancy, antepartum, third trimester - Slight lag in fundal height today, 26cm, will recheck in 2 weeks, growth [place] 05/08/16.   2. HTN in pregnancy, chronic, third trimester - Controlled today and at home (130-150/80-90s). - On Labetalol 800mg  TID and Amlodipine 10mg  BID.  - No sign/symptoms of pre-eclampsia  3. Morbid obesity, unspecified obesity type (HCC)  4. LGSIL Pap smear of vagina - Colpo performed 03/09/16 without biopsy, recommend to repeat colpo after pregnancy for biopsy. Visible lesions at 6-9 o'clock.   Infant feeding: plans to breastfeed, plans to bottle  feed. Discussed fetal kick counts, monitoring BPs, and pre-E precautions given.  Follow up in 2 Weeks.     Jen Mow, DO OB Fellow

## 2016-04-24 NOTE — Patient Instructions (Addendum)
Preeclampsia and Eclampsia Preeclampsia is a serious condition that develops only during pregnancy. It is also called toxemia of pregnancy. This condition causes high blood pressure along with other symptoms, such as swelling and headaches. These may develop as the condition gets worse. Preeclampsia may occur 20 weeks or later into your pregnancy.  Diagnosing and treating preeclampsia early is very important. If not treated early, it can cause serious problems for you and your baby. One problem it can lead to is eclampsia, which is a condition that causes muscle jerking or shaking (convulsions) in the mother. Delivering your baby is the best treatment for preeclampsia or eclampsia.  RISK FACTORS The cause of preeclampsia is not known. You may be more likely to develop preeclampsia if you have certain risk factors. These include:   Being pregnant for the first time.  Having preeclampsia in a past pregnancy.  Having a family history of preeclampsia.  Having high blood pressure.  Being pregnant with twins or triplets.  Being 35 or older.  Being African American.  Having kidney disease or diabetes.  Having medical conditions such as lupus or blood diseases.  Being very overweight (obese). SIGNS AND SYMPTOMS  The earliest signs of preeclampsia are:  High blood pressure.  Increased protein in your urine. Your health care provider will check for this at every prenatal visit. Other symptoms that can develop include:   Severe headaches.  Sudden weight gain.  Swelling of your hands, face, legs, and feet.  Feeling sick to your stomach (nauseous) and throwing up (vomiting).  Vision problems (blurred or double vision).  Numbness in your face, arms, legs, and feet.  Dizziness.  Slurred speech.  Sensitivity to bright lights.  Abdominal pain. DIAGNOSIS  There are no screening tests for preeclampsia. Your health care provider will ask you about symptoms and check for signs of  preeclampsia during your prenatal visits. You may also have tests, including:  Urine testing.  Blood testing.  Checking your baby's heart rate.  Checking the health of your baby and your placenta using images created with sound waves (ultrasound). TREATMENT  You can work out the best treatment approach together with your health care provider. It is very important to keep all prenatal appointments. If you have an increased risk of preeclampsia, you may need more frequent prenatal exams.  Your health care provider may prescribe bed rest.  You may have to eat as little salt as possible.  You may need to take medicine to lower your blood pressure if the condition does not respond to more conservative measures.  You may need to stay in the hospital if your condition is severe. There, treatment will focus on controlling your blood pressure and fluid retention. You may also need to take medicine to prevent seizures.  If the condition gets worse, your baby may need to be delivered early to protect you and the baby. You may have your labor started with medicine (be induced), or you may have a cesarean delivery.  Preeclampsia usually goes away after the baby is born. HOME CARE INSTRUCTIONS   Only take over-the-counter or prescription medicines as directed by your health care provider.  Lie on your left side while resting. This keeps pressure off your baby.  Elevate your feet while resting.  Get regular exercise. Ask your health care provider what type of exercise is safe for you.  Avoid caffeine and alcohol.  Do not smoke.  Drink 6-8 glasses of water every day.  Eat a balanced diet   that is low in salt. Do not add salt to your food.  Avoid stressful situations as much as possible.  Get plenty of rest and sleep.  Keep all prenatal appointments and tests as scheduled. SEEK MEDICAL CARE IF:  You are gaining more weight than expected.  You have any headaches, abdominal pain, or  nausea.  You are bruising more than usual.  You feel dizzy or light-headed. SEEK IMMEDIATE MEDICAL CARE IF:   You develop sudden or severe swelling anywhere in your body. This usually happens in the legs.  You gain 5 lb (2.3 kg) or more in a week.  You have a severe headache, dizziness, problems with your vision, or confusion.  You have severe abdominal pain.  You have lasting nausea or vomiting.  You have a seizure.  You have trouble moving any part of your body.  You develop numbness in your body.  You have trouble speaking.  You have any abnormal bleeding.  You develop a stiff neck.  You pass out. MAKE SURE YOU:   Understand these instructions.  Will watch your condition.  Will get help right away if you are not doing well or get worse.   This information is not intended to replace advice given to you by your health care provider. Make sure you discuss any questions you have with your health care provider.   Document Released: 09/15/2000 Document Revised: 09/23/2013 Document Reviewed: 07/11/2013 Elsevier Interactive Patient Education 2016 [place] of Pregnancy The third trimester is from week 29 through week 42, months 7 through 9. The third trimester is a time when the fetus is growing rapidly. At the end of the ninth month, the fetus is about 20 inches in length and weighs 6-10 pounds.  BODY CHANGES Your body goes through many changes during pregnancy. The changes vary from woman to woman.   Your weight will continue to increase. You can expect to gain 25-35 pounds (11-16 kg) by the end of the pregnancy.  You may begin to get stretch marks on your hips, abdomen, and breasts.  You may urinate more often because the fetus is moving lower into your pelvis and pressing on your bladder.  You may develop or continue to have heartburn as a result of your pregnancy.  You may develop constipation because certain hormones are causing the  muscles that push waste through your intestines to slow down.  You may develop hemorrhoids or swollen, bulging veins (varicose veins).  You may have pelvic pain because of the weight gain and pregnancy hormones relaxing your joints between the bones in your pelvis. Backaches may result from overexertion of the muscles supporting your posture.  You may have changes in your hair. These can include thickening of your hair, rapid growth, and changes in texture. Some women also have hair loss during or after pregnancy, or hair that feels dry or thin. Your hair will most likely return to normal after your baby is born.  Your breasts will continue to grow and be tender. A yellow discharge may leak from your breasts called colostrum.  Your belly button may stick out.  You may feel short of breath because of your expanding uterus.  You may notice the fetus "dropping," or moving lower in your abdomen.  You may have a bloody mucus discharge. This usually occurs a few days to a week before labor begins.  Your cervix becomes thin and soft (effaced) near your due date. WHAT TO EXPECT AT YOUR PRENATAL  EXAMS  You will have prenatal exams every 2 weeks until week 36. Then, you will have weekly prenatal exams. During a routine prenatal visit:  You will be weighed to make sure you and the fetus are growing normally.  Your blood pressure is taken.  Your abdomen will be measured to track your baby's growth.  The fetal heartbeat will be listened to.  Any test results from the previous visit will be discussed.  You may have a cervical check near your due date to see if you have effaced. At around 36 weeks, your caregiver will check your cervix. At the same time, your caregiver will also perform a test on the secretions of the vaginal tissue. This test is to determine if a type of bacteria, Group B streptococcus, is present. Your caregiver will explain this further. Your caregiver may ask you:  What your  birth plan is.  How you are feeling.  If you are feeling the baby move.  If you have had any abnormal symptoms, such as leaking fluid, bleeding, severe headaches, or abdominal cramping.  If you are using any tobacco products, including cigarettes, chewing tobacco, and electronic cigarettes.  If you have any questions. Other tests or screenings that may be performed during your third trimester include:  Blood tests that check for low iron levels (anemia).  Fetal testing to check the health, activity level, and growth of the fetus. Testing is done if you have certain medical conditions or if there are problems during the pregnancy.  HIV (human immunodeficiency virus) testing. If you are at high risk, you may be screened for HIV during your third trimester of pregnancy. FALSE LABOR You may feel small, irregular contractions that eventually go away. These are called Braxton Hicks contractions, or false labor. Contractions may last for hours, days, or even weeks before true labor sets in. If contractions come at regular intervals, intensify, or become painful, it is best to be seen by your caregiver.  SIGNS OF LABOR   Menstrual-like cramps.  Contractions that are 5 minutes apart or less.  Contractions that start on the top of the uterus and spread down to the lower abdomen and back.  A sense of increased pelvic pressure or back pain.  A watery or bloody mucus discharge that comes from the vagina. If you have any of these signs before the 37th week of pregnancy, call your caregiver right away. You need to go to the hospital to get checked immediately. HOME CARE INSTRUCTIONS   Avoid all smoking, herbs, alcohol, and unprescribed drugs. These chemicals affect the formation and growth of the baby.  Do not use any tobacco products, including cigarettes, chewing tobacco, and electronic cigarettes. If you need help quitting, ask your health care provider. You may receive counseling support and  other resources to help you quit.  Follow your caregiver's instructions regarding medicine use. There are medicines that are either safe or unsafe to take during pregnancy.  Exercise only as directed by your caregiver. Experiencing uterine cramps is a good sign to stop exercising.  Continue to eat regular, healthy meals.  Wear a good support bra for breast tenderness.  Do not use hot tubs, steam rooms, or saunas.  Wear your seat belt at all times when driving.  Avoid raw meat, uncooked cheese, cat litter boxes, and soil used by cats. These carry germs that can cause birth defects in the baby.  Take your prenatal vitamins.  Take 1500-2000 mg of calcium daily starting at the   20th week of pregnancy until you deliver your baby.  Try taking a stool softener (if your caregiver approves) if you develop constipation. Eat more high-fiber foods, such as fresh vegetables or fruit and whole grains. Drink plenty of fluids to keep your urine clear or pale yellow.  Take warm sitz baths to soothe any pain or discomfort caused by hemorrhoids. Use hemorrhoid cream if your caregiver approves.  If you develop varicose veins, wear support hose. Elevate your feet for 15 minutes, 3-4 times a day. Limit salt in your diet.  Avoid heavy lifting, wear low heal shoes, and practice good posture.  Rest a lot with your legs elevated if you have leg cramps or low back pain.  Visit your dentist if you have not gone during your pregnancy. Use a soft toothbrush to brush your teeth and be gentle when you floss.  A sexual relationship may be continued unless your caregiver directs you otherwise.  Do not travel far distances unless it is absolutely necessary and only with the approval of your caregiver.  Take prenatal classes to understand, practice, and ask questions about the labor and delivery.  Make a trial run to the hospital.  Pack your hospital bag.  Prepare the baby's nursery.  Continue to go to all  your prenatal visits as directed by your caregiver. SEEK MEDICAL CARE IF:  You are unsure if you are in labor or if your water has broken.  You have dizziness.  You have mild pelvic cramps, pelvic pressure, or nagging pain in your abdominal area.  You have persistent nausea, vomiting, or diarrhea.  You have a bad smelling vaginal discharge.  You have pain with urination. SEEK IMMEDIATE MEDICAL CARE IF:   You have a fever.  You are leaking fluid from your vagina.  You have spotting or bleeding from your vagina.  You have severe abdominal cramping or pain.  You have rapid weight loss or gain.  You have shortness of breath with chest pain.  You notice sudden or extreme swelling of your face, hands, ankles, feet, or legs.  You have not felt your baby move in over an hour.  You have severe headaches that do not go away with medicine.  You have vision changes.   This information is not intended to replace advice given to you by your health care provider. Make sure you discuss any questions you have with your health care provider.   Document Released: 09/12/2001 Document Revised: 10/09/2014 Document Reviewed: 11/19/2012 Elsevier Interactive Patient Education 2016 [employer].  Hypertension During Pregnancy Hypertension, or high blood pressure, is when there is extra pressure inside your blood vessels that carry blood from the heart to the rest of your body (arteries). It can happen at any time in life, including pregnancy. Hypertension during pregnancy can cause problems for you and your baby. Your baby might not weigh as much as he or she should at birth or might be born early (premature). Very bad cases of hypertension during pregnancy can be life-threatening.  Different types of hypertension can occur during pregnancy. These include: Chronic hypertension. This happens when a woman has hypertension before pregnancy and it continues during pregnancy. Gestational hypertension.  This is when hypertension develops during pregnancy. Preeclampsia or toxemia of pregnancy. This is a very serious type of hypertension that develops only during pregnancy. It affects the whole body and can be very dangerous for both mother and baby.  Gestational hypertension and preeclampsia usually go away after your baby is born.  Your blood pressure will likely stabilize within 6 weeks. Women who have hypertension during pregnancy have a greater chance of developing hypertension later in life or with future pregnancies. RISK FACTORS There are certain factors that make it more likely for you to develop hypertension during pregnancy. These include: Having hypertension before pregnancy. Having hypertension during a previous pregnancy. Being overweight. Being older than 40 years. Being pregnant with more than one baby. Having diabetes or kidney problems. SIGNS AND SYMPTOMS Chronic and gestational hypertension rarely cause symptoms. Preeclampsia has symptoms, which may include: Increased protein in your urine. Your health care provider will check for this at every prenatal visit. Swelling of your hands and face. Rapid weight gain. Headaches. Visual changes. Being bothered by light. Abdominal pain, especially in the upper right area. Chest pain. Shortness of breath. Increased reflexes. Seizures. These occur with a more severe form of preeclampsia, called eclampsia. DIAGNOSIS  You may be diagnosed with hypertension during a regular prenatal exam. At each prenatal visit, you may have: Your blood pressure checked. A urine test to check for protein in your urine. The type of hypertension you are diagnosed with depends on when you developed it. It also depends on your specific blood pressure reading. Developing hypertension before 20 weeks of pregnancy is consistent with chronic hypertension. Developing hypertension after 20 weeks of pregnancy is consistent with gestational  hypertension. Hypertension with increased urinary protein is diagnosed as preeclampsia. Blood pressure measurements that stay above 160 systolic or 110 diastolic are a sign of severe preeclampsia. TREATMENT Treatment for hypertension during pregnancy varies. Treatment depends on the type of hypertension and how serious it is. If you take medicine for chronic hypertension, you may need to switch medicines. Medicines called ACE inhibitors should not be taken during pregnancy. Low-dose aspirin may be suggested for women who have risk factors for preeclampsia. If you have gestational hypertension, you may need to take a blood pressure medicine that is safe during pregnancy. Your health care provider will recommend the correct medicine. If you have severe preeclampsia, you may need to be in the hospital. Health care providers will watch you and your baby very closely. You also may need to take medicine called magnesium sulfate to prevent seizures and lower blood pressure. Sometimes, an early delivery is needed. This may be the case if the condition worsens. It would be done to protect you and your baby. The only cure for preeclampsia is delivery. Your health care provider may recommend that you take one low-dose aspirin (81 mg) each day to help prevent high blood pressure during your pregnancy if you are at risk for preeclampsia. You may be at risk for preeclampsia if: You had preeclampsia or eclampsia during a previous pregnancy. Your baby did not grow as expected during a previous pregnancy. You experienced preterm birth with a previous pregnancy. You experienced a separation of the placenta from the uterus (placental abruption) during a previous pregnancy. You experienced the loss of your baby during a previous pregnancy. You are pregnant with more than one baby. You have other medical conditions, such as diabetes or an autoimmune disease. HOME CARE INSTRUCTIONS Schedule and keep all of your  regular prenatal care appointments. This is important. Take medicines only as directed by your health care provider. Tell your health care provider about all medicines you take. Eat as little salt as possible. Get regular exercise. Do not drink alcohol. Do not use tobacco products. Do not drink products with caffeine. Lie on your left side when  resting. SEEK IMMEDIATE MEDICAL CARE IF: You have severe abdominal pain. You have sudden swelling in your hands, ankles, or face. You gain 4 pounds (1.8 kg) or more in 1 week. You vomit repeatedly. You have vaginal bleeding. You do not feel your baby moving as much. You have a headache. You have blurred or double vision. You have muscle twitching or spasms. You have shortness of breath. You have blue fingernails or lips. You have blood in your urine. MAKE SURE YOU: Understand these instructions. Will watch your condition. Will get help right away if you are not doing well or get worse.   This information is not intended to replace advice given to you by your health care provider. Make sure you discuss any questions you have with your health care provider.   Document Released: 06/06/2011 Document Revised: 10/09/2014 Document Reviewed: 04/17/2013 Elsevier Interactive Patient Education [employer]

## 2016-05-03 ENCOUNTER — Encounter (HOSPITAL_COMMUNITY): Payer: Self-pay

## 2016-05-03 ENCOUNTER — Inpatient Hospital Stay (HOSPITAL_COMMUNITY): Payer: Medicaid Other

## 2016-05-03 ENCOUNTER — Inpatient Hospital Stay (HOSPITAL_COMMUNITY)
Admission: AD | Admit: 2016-05-03 | Discharge: 2016-05-07 | DRG: 765 | Disposition: A | Discharge status: Home / Self Care | Payer: Medicaid Other | Source: Ambulatory Visit | Attending: Obstetrics and Gynecology | Admitting: Obstetrics and Gynecology

## 2016-05-03 DIAGNOSIS — R079 Chest pain, unspecified: Secondary | ICD-10-CM

## 2016-05-03 DIAGNOSIS — O0993 Supervision of high risk pregnancy, unspecified, third trimester: Secondary | ICD-10-CM

## 2016-05-03 DIAGNOSIS — Z833 Family history of diabetes mellitus: Secondary | ICD-10-CM

## 2016-05-03 DIAGNOSIS — I429 Cardiomyopathy, unspecified: Secondary | ICD-10-CM | POA: Diagnosis present

## 2016-05-03 DIAGNOSIS — O1490 Unspecified pre-eclampsia, unspecified trimester: Secondary | ICD-10-CM | POA: Diagnosis present

## 2016-05-03 DIAGNOSIS — O9912 Other diseases of the blood and blood-forming organs and certain disorders involving the immune mechanism complicating childbirth: Secondary | ICD-10-CM | POA: Diagnosis present

## 2016-05-03 DIAGNOSIS — O99214 Obesity complicating childbirth: Secondary | ICD-10-CM | POA: Diagnosis present

## 2016-05-03 DIAGNOSIS — O114 Pre-existing hypertension with pre-eclampsia, complicating childbirth: Secondary | ICD-10-CM | POA: Diagnosis present

## 2016-05-03 DIAGNOSIS — Z6838 Body mass index (BMI) 38.0-38.9, adult: Secondary | ICD-10-CM | POA: Diagnosis not present

## 2016-05-03 DIAGNOSIS — D696 Thrombocytopenia, unspecified: Secondary | ICD-10-CM | POA: Diagnosis present

## 2016-05-03 DIAGNOSIS — D649 Anemia, unspecified: Secondary | ICD-10-CM | POA: Diagnosis present

## 2016-05-03 DIAGNOSIS — R7989 Other specified abnormal findings of blood chemistry: Secondary | ICD-10-CM | POA: Diagnosis present

## 2016-05-03 DIAGNOSIS — O1410 Severe pre-eclampsia, unspecified trimester: Secondary | ICD-10-CM | POA: Diagnosis present

## 2016-05-03 DIAGNOSIS — Z3A29 29 weeks gestation of pregnancy: Secondary | ICD-10-CM

## 2016-05-03 DIAGNOSIS — O9902 Anemia complicating childbirth: Secondary | ICD-10-CM | POA: Diagnosis present

## 2016-05-03 DIAGNOSIS — O10913 Unspecified pre-existing hypertension complicating pregnancy, third trimester: Secondary | ICD-10-CM

## 2016-05-03 DIAGNOSIS — O9942 Diseases of the circulatory system complicating childbirth: Secondary | ICD-10-CM | POA: Diagnosis present

## 2016-05-03 DIAGNOSIS — O1493 Unspecified pre-eclampsia, third trimester: Secondary | ICD-10-CM

## 2016-05-03 DIAGNOSIS — O1414 Severe pre-eclampsia complicating childbirth: Secondary | ICD-10-CM | POA: Diagnosis not present

## 2016-05-03 DIAGNOSIS — O119 Pre-existing hypertension with pre-eclampsia, unspecified trimester: Secondary | ICD-10-CM

## 2016-05-03 HISTORY — DX: Gestational (pregnancy-induced) hypertension without significant proteinuria, unspecified trimester: O13.9

## 2016-05-03 LAB — URINE MICROSCOPIC-ADD ON

## 2016-05-03 LAB — COMPREHENSIVE METABOLIC PANEL
ALT: 72 U/L — ABNORMAL HIGH (ref 14–54)
ALT: 111 U/L — ABNORMAL HIGH (ref 14–54)
AST: 84 U/L — ABNORMAL HIGH (ref 15–41)
AST: 138 U/L — ABNORMAL HIGH (ref 15–41)
Albumin: 3.3 g/dL — ABNORMAL LOW (ref 3.5–5.0)
Albumin: 3.4 g/dL — ABNORMAL LOW (ref 3.5–5.0)
Alkaline Phosphatase: 105 U/L (ref 38–126)
Alkaline Phosphatase: 115 U/L (ref 38–126)
Anion gap: 9 (ref 5–15)
Anion gap: 8 (ref 5–15)
BUN: 14 mg/dL (ref 6–20)
BUN: 13 mg/dL (ref 6–20)
CO2: 16 mmol/L — ABNORMAL LOW (ref 22–32)
CO2: 18 mmol/L — ABNORMAL LOW (ref 22–32)
Calcium: 9 mg/dL (ref 8.9–10.3)
Calcium: 8.7 mg/dL — ABNORMAL LOW (ref 8.9–10.3)
Chloride: 109 mmol/L (ref 101–111)
Chloride: 107 mmol/L (ref 101–111)
Creatinine, Ser: 1.2 mg/dL — ABNORMAL HIGH (ref 0.44–1.00)
Creatinine, Ser: 1.12 mg/dL — ABNORMAL HIGH (ref 0.44–1.00)
GFR calc Af Amer: >60 mL/min (ref >60)
GFR calc Af Amer: >60 mL/min (ref >60)
GFR calc non Af Amer: >60 mL/min (ref >60)
GFR calc non Af Amer: >60 mL/min (ref >60)
Glucose, Bld: 79 mg/dL (ref 65–99)
Glucose, Bld: 171 mg/dL — ABNORMAL HIGH (ref 65–99)
Potassium: 4.1 mmol/L (ref 3.5–5.1)
Potassium: 4.1 mmol/L (ref 3.5–5.1)
Sodium: 134 mmol/L — ABNORMAL LOW (ref 135–145)
Sodium: 133 mmol/L — ABNORMAL LOW (ref 135–145)
Total Bilirubin: 0.5 mg/dL (ref 0.3–1.2)
Total Bilirubin: 0.4 mg/dL (ref 0.3–1.2)
Total Protein: 6.8 g/dL (ref 6.5–8.1)
Total Protein: 7 g/dL (ref 6.5–8.1)

## 2016-05-03 LAB — PROTEIN / CREATININE RATIO, URINE
Creatinine, Urine: 210 mg/dL
Protein Creatinine Ratio: 0.34 mg/mg{Cre} — ABNORMAL HIGH (ref 0.00–0.15)
Total Protein, Urine: 71 mg/dL

## 2016-05-03 LAB — URINALYSIS, ROUTINE W REFLEX MICROSCOPIC
Bilirubin Urine: NEGATIVE
Glucose, UA: NEGATIVE mg/dL
Ketones, ur: NEGATIVE mg/dL
Leukocytes, UA: NEGATIVE
Nitrite: NEGATIVE
Protein, ur: 30 mg/dL — AB
Specific Gravity, Urine: 1.025 (ref 1.005–1.030)
pH: 5.5 (ref 5.0–8.0)

## 2016-05-03 LAB — TYPE AND SCREEN
ABO/RH(D): O POS
Antibody Screen: NEGATIVE

## 2016-05-03 LAB — CBC
HCT: 26 % — ABNORMAL LOW (ref 36.0–46.0)
HCT: 27.6 % — ABNORMAL LOW (ref 36.0–46.0)
Hemoglobin: 9.2 g/dL — ABNORMAL LOW (ref 12.0–15.0)
Hemoglobin: 9.7 g/dL — ABNORMAL LOW (ref 12.0–15.0)
MCH: 31.6 pg (ref 26.0–34.0)
MCH: 31.5 pg (ref 26.0–34.0)
MCHC: 35.4 g/dL (ref 30.0–36.0)
MCHC: 35.1 g/dL (ref 30.0–36.0)
MCV: 89.3 fL (ref 78.0–100.0)
MCV: 89.6 fL (ref 78.0–100.0)
Platelets: 194 10*3/uL (ref 150–400)
Platelets: 181 10*3/uL (ref 150–400)
RBC: 2.91 MIL/uL — ABNORMAL LOW (ref 3.87–5.11)
RBC: 3.08 MIL/uL — ABNORMAL LOW (ref 3.87–5.11)
RDW: 13 % (ref 11.5–15.5)
RDW: 13.1 % (ref 11.5–15.5)
WBC: 9.7 10*3/uL (ref 4.0–10.5)
WBC: 13 10*3/uL — ABNORMAL HIGH (ref 4.0–10.5)

## 2016-05-03 LAB — ECHOCARDIOGRAM COMPLETE
Height: 62 in
Weight: 3360 oz

## 2016-05-03 LAB — ABO/RH: ABO/RH(D): O POS

## 2016-05-03 MED ORDER — TERBUTALINE SULFATE 1 MG/ML IJ SOLN: 0.2500 mg | Freq: Once | INTRAMUSCULAR | Status: DC | PRN

## 2016-05-03 MED ORDER — HYDRALAZINE HCL 20 MG/ML IJ SOLN
5.0000 mg | INTRAMUSCULAR | Status: AC | PRN
Start: 2016-05-03 — End: 2016-05-03
  Administered 2016-05-03 (×2): 5 mg via INTRAVENOUS
  Filled 2016-05-03: qty 1

## 2016-05-03 MED ORDER — OXYTOCIN 40 UNITS IN LACTATED RINGERS INFUSION - SIMPLE MED: 1.0000 m[IU]/min | INTRAVENOUS | Status: DC

## 2016-05-03 MED ORDER — CALCIUM CARBONATE ANTACID 500 MG PO CHEW: 2.0000 | CHEWABLE_TABLET | ORAL | Status: DC | PRN

## 2016-05-03 MED ORDER — ACETAMINOPHEN 325 MG PO TABS: 650.0000 mg | ORAL_TABLET | ORAL | Status: DC | PRN

## 2016-05-03 MED ORDER — PENICILLIN G POTASSIUM 5000000 UNITS IJ SOLR
5.0000 10*6.[IU] | Freq: Once | INTRAVENOUS | Status: AC
  Administered 2016-05-03: 5 10*6.[IU] via INTRAVENOUS
  Filled 2016-05-03: qty 5

## 2016-05-03 MED ORDER — LABETALOL HCL 300 MG PO TABS
800.0000 mg | ORAL_TABLET | Freq: Three times a day (TID) | ORAL | Status: DC
  Administered 2016-05-03 (×2): 800 mg via ORAL
  Filled 2016-05-03: qty 2
  Filled 2016-05-03: qty 1
  Filled 2016-05-03: qty 2

## 2016-05-03 MED ORDER — MAGNESIUM SULFATE 50 % IJ SOLN
1.0000 g/h | INTRAVENOUS | Status: AC
  Administered 2016-05-04: 1 g/h via INTRAVENOUS
  Filled 2016-05-03 (×2): qty 80

## 2016-05-03 MED ORDER — HYDRALAZINE HCL 20 MG/ML IJ SOLN: 10.0000 mg | Freq: Once | INTRAMUSCULAR | Status: DC | PRN

## 2016-05-03 MED ORDER — MAGNESIUM SULFATE 50 % IJ SOLN
2.0000 g/h | INTRAVENOUS | Status: DC
  Administered 2016-05-03: 2 g/h via INTRAVENOUS
  Filled 2016-05-03: qty 80

## 2016-05-03 MED ORDER — METOCLOPRAMIDE HCL 5 MG/ML IJ SOLN
10.0000 mg | Freq: Once | INTRAMUSCULAR | Status: AC
  Administered 2016-05-03: 10 mg via INTRAVENOUS
  Filled 2016-05-03: qty 2

## 2016-05-03 MED ORDER — LACTATED RINGERS IV SOLN
INTRAVENOUS | Status: DC
  Administered 2016-05-04 (×2): via INTRAVENOUS

## 2016-05-03 MED ORDER — ASPIRIN EC 81 MG PO TBEC
81.0000 mg | DELAYED_RELEASE_TABLET | Freq: Every day | ORAL | Status: DC
  Administered 2016-05-03: 81 mg via ORAL
  Filled 2016-05-03 (×2): qty 1

## 2016-05-03 MED ORDER — ZOLPIDEM TARTRATE 5 MG PO TABS: 5.0000 mg | ORAL_TABLET | Freq: Every evening | ORAL | Status: DC | PRN

## 2016-05-03 MED ORDER — OXYCODONE-ACETAMINOPHEN 5-325 MG PO TABS
1.0000 | ORAL_TABLET | ORAL | Status: DC | PRN
Start: 2016-05-03 — End: 2016-05-04

## 2016-05-03 MED ORDER — LACTATED RINGERS IV SOLN
INTRAVENOUS | Status: DC
  Administered 2016-05-03: 14:00:00 via INTRAVENOUS

## 2016-05-03 MED ORDER — OXYTOCIN 40 UNITS IN LACTATED RINGERS INFUSION - SIMPLE MED
1.0000 m[IU]/min | INTRAVENOUS | Status: DC
  Administered 2016-05-03: 1 m[IU]/min via INTRAVENOUS
  Filled 2016-05-03: qty 1000

## 2016-05-03 MED ORDER — AMLODIPINE BESYLATE 10 MG PO TABS
10.0000 mg | ORAL_TABLET | Freq: Every day | ORAL | Status: DC
  Administered 2016-05-03: 10 mg via ORAL
  Filled 2016-05-03: qty 1

## 2016-05-03 MED ORDER — OXYTOCIN BOLUS FROM INFUSION: 500.0000 mL | Freq: Once | INTRAVENOUS | Status: DC

## 2016-05-03 MED ORDER — AMLODIPINE BESYLATE 10 MG PO TABS
10.0000 mg | ORAL_TABLET | Freq: Two times a day (BID) | ORAL | Status: DC
  Administered 2016-05-03: 10 mg via ORAL
  Filled 2016-05-03 (×5): qty 1

## 2016-05-03 MED ORDER — OXYCODONE-ACETAMINOPHEN 5-325 MG PO TABS: 2.0000 | ORAL_TABLET | ORAL | Status: DC | PRN

## 2016-05-03 MED ORDER — BETAMETHASONE SOD PHOS & ACET 6 (3-3) MG/ML IJ SUSP
12.0000 mg | Freq: Two times a day (BID) | INTRAMUSCULAR | Status: AC
  Administered 2016-05-03: 12 mg via INTRAMUSCULAR
  Filled 2016-05-03 (×2): qty 2

## 2016-05-03 MED ORDER — SOD CITRATE-CITRIC ACID 500-334 MG/5ML PO SOLN
30.0000 mL | ORAL | Status: DC | PRN
  Administered 2016-05-04: 30 mL via ORAL
  Filled 2016-05-03: qty 15

## 2016-05-03 MED ORDER — LIDOCAINE HCL (PF) 1 % IJ SOLN
30.0000 mL | INTRAMUSCULAR | Status: DC | PRN
Start: 2016-05-03 — End: 2016-05-04

## 2016-05-03 MED ORDER — ONDANSETRON HCL 4 MG/2ML IJ SOLN
4.0000 mg | Freq: Four times a day (QID) | INTRAMUSCULAR | Status: DC | PRN
  Administered 2016-05-03 – 2016-05-04 (×2): 4 mg via INTRAVENOUS
  Filled 2016-05-03: qty 2

## 2016-05-03 MED ORDER — OXYTOCIN 40 UNITS IN LACTATED RINGERS INFUSION - SIMPLE MED: 2.5000 [IU]/h | INTRAVENOUS | Status: DC

## 2016-05-03 MED ORDER — FLEET ENEMA 7-19 GM/118ML RE ENEM: 1.0000 | ENEMA | RECTAL | Status: DC | PRN

## 2016-05-03 MED ORDER — LABETALOL HCL 5 MG/ML IV SOLN: 20.0000 mg | INTRAVENOUS | Status: DC | PRN

## 2016-05-03 MED ORDER — LACTATED RINGERS IV SOLN: 500.0000 mL | INTRAVENOUS | Status: DC | PRN

## 2016-05-03 MED ORDER — PRENATAL MULTIVITAMIN CH: 1.0000 | ORAL_TABLET | Freq: Every day | ORAL | Status: DC

## 2016-05-03 MED ORDER — MAGNESIUM SULFATE BOLUS VIA INFUSION
4.0000 g | Freq: Once | INTRAVENOUS | Status: AC
  Administered 2016-05-03: 4 g via INTRAVENOUS
  Filled 2016-05-03: qty 500

## 2016-05-03 MED ORDER — PENICILLIN G POTASSIUM 5000000 UNITS IJ SOLR
2.5000 10*6.[IU] | INTRAVENOUS | Status: DC
  Administered 2016-05-04: 2.5 10*6.[IU] via INTRAVENOUS
  Filled 2016-05-03 (×5): qty 2.5

## 2016-05-03 MED ORDER — DOCUSATE SODIUM 100 MG PO CAPS
100.0000 mg | ORAL_CAPSULE | Freq: Every day | ORAL | Status: DC
  Filled 2016-05-03: qty 1

## 2016-05-03 MED ORDER — PANTOPRAZOLE SODIUM 40 MG PO TBEC
40.0000 mg | DELAYED_RELEASE_TABLET | Freq: Every day | ORAL | Status: DC
  Administered 2016-05-03: 40 mg via ORAL
  Filled 2016-05-03: qty 1

## 2016-05-03 NOTE — MAU Note (Signed)
Pt has been having pain in her abdomen/chest that is radiating toward her back since this morning.  Has tried Tums and soda to try to see if it was gas and if that would relieve it, it did not.

## 2016-05-03 NOTE — Consult Note (Signed)
Maternal Fetal Medicine Consultation  Requesting Provider(s): Derenda Mis, MD  Reason for consultation: Preeclampsia with severe features  HPI: Patty Gilmore is a 25 yo G1P0, EDD 07/17/2016 who is currently at 29w 2d seen for consultation due to preeclampsia with severe features.  She reports an episode of chest pain that prompted her to come in to MAU for evaluation.  On arrival, she was noted to have severe range blood pressures in the 160's/120's which have since improved.  Her chest pain has also improved somewhat.  Preeclampsia labs on arrival showed elevated LFTs with a normal platelet count and a protein/creatinine ratio of 0.34.  She completed a course of betamethasone on 7/10 and 7/11.  She currently denies HA, visual changes or RUQ pain.  The fetus is active.  Patty Gilmore has a long history of chronic hypertension "since the 6th grade".  She was on Amlodipine prior to pregnancy.  She reports that she had a work up after the diagnosis, but records were not available for review.  She reports that the heart wall looked thickened on echo - but was later cleared.  Patty Gilmore was admitted in early July for resistant chronic hypertension.  She is currently on Norvasc 10 mg daily and Labetalol 800 mg TID.    OB History: OB History    Gravida Para Term Preterm AB Living   1             SAB TAB Ectopic Multiple Live Births                  PMH:  Past Medical History:  Diagnosis Date  . 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 Hypertension    was taking amlodipine stopped about 1 yr ago - no insurance - had been on meds since age 49  . Pregnancy induced hypertension     PSH:  Past Surgical History:  Procedure Laterality Date  . LUMBAR PUNCTURE  07/20/2014   fluid build up around spinal column  . NO PAST SURGERIES     Meds:  Scheduled Meds: . amLODipine  10 mg Oral BID  . betamethasone acetate-betamethasone sodium phosphate   12 mg Intramuscular Q12H  . labetalol  800 mg Oral TID  . oxytocin 40 units in LR 1000 mL  500 mL Intravenous Once  . pencillin G potassium IV  5 Million Units Intravenous Once   Followed by  . pencillin G potassium IV  2.5 Million Units Intravenous Q4H   Continuous Infusions: . lactated ringers    . magnesium sulfate    . oxytocin     PRN Meds:.acetaminophen, lactated ringers, lidocaine (PF), ondansetron, oxyCODONE-acetaminophen, oxyCODONE-acetaminophen, sodium citrate-citric acid, sodium phosphate   Allergies: No Known Allergies   FH:  Family History  Problem Relation Age of Onset  . Diabetes Maternal Grandmother   . Diabetes Paternal Grandmother   . Thyroid disease Mother    Soc:  Social History   Social History  . Marital status: Single    Spouse name: N/A  . Number of children: N/A  . Years of education: N/A   Occupational History  .  Caterer    [employer]   Social History Main Topics  . Smoking status: Never Smoker  . Smokeless tobacco: Never Used  . Alcohol use No     Comment: Rarely (less than once per month)  . Drug use:     Types: Marijuana  .  Sexual activity: Yes    Birth control/ protection: None   Other Topics Concern  . Not on file   Social History Narrative  . No narrative on file    Review of Systems: no vaginal bleeding or cramping/contractions, no LOF, no nausea/vomiting. All other systems reviewed and are negative.  PE:   Vitals:   05/03/16 1316 05/03/16 1402  BP: (!) 152/93   Pulse: 70   Resp: 16 16  Temp:      GEN: well-appearing female ABD: gravid, NT  Ultrasound: Single IUP at 29w 2d Preeclampsia with severe features The estimated fetal weight is < 10th %tile.  The AC measures < 3rd %tile. UA Doppler studies: persistent absent end diastolic flow.  No REDF noted BPP 6/8 Subjectively decreased amniotic fluid volume (AFI 9.5 cm)  Labs: CBC    Component Value Date/Time   WBC 9.7 05/03/2016 0945   RBC 2.91 (L)  05/03/2016 0945   HGB 9.2 (L) 05/03/2016 0945   HGB 10.2 12/23/2015   HCT 26.0 (L) 05/03/2016 0945   HCT 30 12/23/2015   PLT 194 05/03/2016 0945   PLT 320 12/23/2015   MCV 89.3 05/03/2016 0945   MCV 97.0 01/26/2014 1815   MCH 31.6 05/03/2016 0945   MCHC 35.4 05/03/2016 0945   RDW 13.0 05/03/2016 0945   LYMPHSABS 2.0 12/08/2014 1809   MONOABS 0.6 12/08/2014 1809   EOSABS 0.0 12/08/2014 1809   BASOSABS 0.0 12/08/2014 1809   CMP     Component Value Date/Time   NA 134 (L) 05/03/2016 0945   K 4.1 05/03/2016 0945   CL 109 05/03/2016 0945   CO2 16 (L) 05/03/2016 0945   GLUCOSE 79 05/03/2016 0945   BUN 14 05/03/2016 0945   CREATININE 1.20 (H) 05/03/2016 0945   CREATININE 0.96 01/17/2016 0001   CALCIUM 9.0 05/03/2016 0945   PROT 6.8 05/03/2016 0945   ALBUMIN 3.3 (L) 05/03/2016 0945   AST 84 (H) 05/03/2016 0945   ALT 72 (H) 05/03/2016 0945   ALKPHOS 105 05/03/2016 0945   BILITOT 0.5 05/03/2016 0945   GFRNONAA >60 05/03/2016 0945   GFRAA >60 05/03/2016 0945   Urine protein Creatinine ratio: 0.34  A/P: 1) Single IUP at 29w 2d  2) Preeclampsia with severe features - Patty Gilmore now has severe range blood pressures despite max doses of both Amlodipine and Labetalol.  Additionally, now has elevated LFTs and a creatinine of 1.2 mg/dl.  Fetal growth restriction is noted with persistent absent diastolic flow.  The patient completed a course of betamethasone on 7/10 and 7/11.   Recommendations: 1) Would recommend delivery given clinical picture.  Would attempt to complete a rescue course of betamethasone since it has now been > 2 weeks since her first course.  Would consider an abbreviated course (12 mg betamethasone q 12 hours rather than q 24 hours). 2) If induction of labor is attempted, would recommend a CST prior to beginning cervical ripening agents.  Would also recommend serial labs every 6 hours - would move toward cesarean delivery if the clinical picture is worsening or if the  fetus does not tolerate labor. 3) NICU consult 4) Would follow serial Magnesium levels given elevated creatinine.  My need to lower Magnesium infusion to 1 gm/hr if there is any evidence of magnesium toxicity. 5) The patient reports a history of chronic hypertension that was first diagnosed "in the 6th grade" - she reports that he had an echo years ago that showed thickening of the walls  of the heart.  May consider maternal echo while the patient is in-house.    Thank you for the opportunity to be a part of the care of Shanelle Timoteo [facility]. Please contact our office if we can be of further assistance.   I spent approximately 30 minutes with this patient with over 50% of time spent in face-to-face counseling.  Alpha Gula, MD Maternal Fetal Medicine

## 2016-05-03 NOTE — Consult Note (Signed)
Neonatology Consult Note:  At the request of the patients obstetrician Dr. Gala Romney I met with Ms. Marijo File.  She is a 25 yo G1P0 who is currently at 29w 2d with pregnancy complicated by  preeclampsia with severe features.  She is on magnesium sulfate and is undergoing induction due to the severity of her preeclampsia.  She received betamethasone 7/10-11 and is currently receiving a rescue course.      We discussed morbidity/mortality at this gestional age, delivery room resuscitation, including intubation and surfactant in DR.  Discussed mechanical ventilation and risk for chronic lung disease, risk for IVH with potential for motor / cognitive deficits, ROP, NEC, sepsis, as well as temperature instability and feeding immaturity.  Discussed NG / OG feeds, benefits of MBM in reducing incidence of NEC.   Discussed likely length of stay.  Thank you for allowing [place] to participate in her care.  Please call with questions.  Higinio Roger, DO  Neonatologist  The total length of face-to-face or floor / unit time for this encounter was 20 minutes.  Counseling and / or coordination of care was greater than fifty percent of the time.

## 2016-05-03 NOTE — Progress Notes (Signed)
Patient ID: Patty Gilmore, female   DOB: 1991-08-27, 25 y.o.   MRN: [id number] FACULTY PRACTICE ANTEPARTUM(COMPREHENSIVE) NOTE  Patty Gilmore is a 25 y.o. G1P0 at [redacted]w[redacted]d  who is admitted for chronic hypertension with Superimposed preeclampsia. MFM has recommended delivery. We have conducted a CST for the last few hours to see if baby would tolerate labor in any way, before beginning cervical ripening..   Fetal presentation is cephalic and confirmed by me by bedside u/s. Length of Stay:  0  Days  Subjective: Pt is feeling tightening in the abdomen, not well documented on Tocodynamometer. Patient reports the fetal movement as active. Patient reports uterine contraction  activity as irregular, every 5 minutes. Patient reports  vaginal bleeding as none. Patient describes fluid per vagina as None.  Vitals:  Blood pressure 132/84, pulse 82, temperature 98.6 F (37 C), temperature source Oral, resp. rate 16, height  (1.575 m), weight 95.3 kg (210 lb), last menstrual period 10/11/2015, SpO2 100 %. Physical Examination:  General appearance - alert, well appearing, and in no distress and oriented to person, place, and time Heart - normal rate and regular rhythm Abdomen - soft, nontender, nondistended Fundal Height:  size equals dates Cervical Exam: Not evaluated. and nd fetal presentation is cephalic. Extremities: extremities normal, atraumatic, no cyanosis or edema and Homans sign is negative, no sign of DVT with DTRs 2+ bilaterally Membranes:intact  Fetal Monitoring:  Baseline: 145 bpm  Labs:  Results for orders placed or performed during the hospital encounter of 05/03/16 (from the past 24 hour(s))  Protein / creatinine ratio, urine   Collection Time: 05/03/16  8:55 AM  Result Value Ref Range   Creatinine, Urine 210.00 mg/dL   Total Protein, Urine 71 mg/dL   Protein Creatinine Ratio 0.34 (H) 0.00 - 0.15 mg/mg[Cre]  Urinalysis, Routine w reflex microscopic (not at [facility])   Collection Time: 05/03/16  8:55 AM  Result Value Ref Range   Color, Urine YELLOW YELLOW   APPearance CLEAR CLEAR   Specific Gravity, Urine 1.025 1.005 - 1.030   pH 5.5 5.0 - 8.0   Glucose, UA NEGATIVE NEGATIVE mg/dL   Hgb urine dipstick TRACE (A) NEGATIVE   Bilirubin Urine NEGATIVE NEGATIVE   Ketones, ur NEGATIVE NEGATIVE mg/dL   Protein, ur 30 (A) NEGATIVE mg/dL   Nitrite NEGATIVE NEGATIVE   Leukocytes, UA NEGATIVE NEGATIVE  Urine microscopic-add on   Collection Time: 05/03/16  8:55 AM  Result Value Ref Range   Squamous Epithelial / LPF 0-5 (A) NONE SEEN   WBC, UA 0-5 0 - 5 WBC/hpf   RBC / HPF 0-5 0 - 5 RBC/hpf   Bacteria, UA FEW (A) NONE SEEN   Urine-Other MUCOUS PRESENT   Comprehensive metabolic panel   Collection Time: 05/03/16  9:45 AM  Result Value Ref Range   Sodium 134 (L) 135 - 145 mmol/L   Potassium 4.1 3.5 - 5.1 mmol/L   Chloride 109 101 - 111 mmol/L   CO2 16 (L) 22 - 32 mmol/L   Glucose, Bld 79 65 - 99 mg/dL   BUN 14 6 - 20 mg/dL   Creatinine, Ser 7.82 (H) 0.44 - 1.00 mg/dL   Calcium 9.0 8.9 - 95.6 mg/dL   Total Protein 6.8 6.5 - 8.1 g/dL   Albumin 3.3 (L) 3.5 - 5.0 g/dL   AST 84 (H) 15 - 41 U/L   ALT 72 (H) 14 - 54 U/L   Alkaline Phosphatase 105 38 - 126 U/L  Total Bilirubin 0.5 0.3 - 1.2 mg/dL   GFR calc non Af Amer >60 >60 mL/min   GFR calc Af Amer >60 >60 mL/min   Anion gap 9 5 - 15  CBC   Collection Time: 05/03/16  9:45 AM  Result Value Ref Range   WBC 9.7 4.0 - 10.5 K/uL   RBC 2.91 (L) 3.87 - 5.11 MIL/uL   Hemoglobin 9.2 (L) 12.0 - 15.0 g/dL   HCT 03.5 (L) 59.7 - 41.6 %   MCV 89.3 78.0 - 100.0 fL   MCH 31.6 26.0 - 34.0 pg   MCHC 35.4 30.0 - 36.0 g/dL   RDW 38.4 53.6 - 46.8 %   Platelets 194 150 - 400 K/uL  Type and screen [facility] [place]   Collection Time: 05/03/16  9:45 AM  Result Value Ref Range   ABO/RH(D) O POS    Antibody Screen NEG    Sample Expiration 05/06/2016   ABO/Rh   Collection Time: 05/03/16  9:45 AM  Result  Value Ref Range   ABO/RH(D) O POS   CBC   Collection Time: 05/03/16  4:13 PM  Result Value Ref Range   WBC 13.0 (H) 4.0 - 10.5 K/uL   RBC 3.08 (L) 3.87 - 5.11 MIL/uL   Hemoglobin 9.7 (L) 12.0 - 15.0 g/dL   HCT 03.2 (L) 12.2 - 48.2 %   MCV 89.6 78.0 - 100.0 fL   MCH 31.5 26.0 - 34.0 pg   MCHC 35.1 30.0 - 36.0 g/dL   RDW 50.0 37.0 - 48.8 %   Platelets 181 150 - 400 K/uL  Comprehensive metabolic panel   Collection Time: 05/03/16  4:13 PM  Result Value Ref Range   Sodium 133 (L) 135 - 145 mmol/L   Potassium 4.1 3.5 - 5.1 mmol/L   Chloride 107 101 - 111 mmol/L   CO2 18 (L) 22 - 32 mmol/L   Glucose, Bld 171 (H) 65 - 99 mg/dL   BUN 13 6 - 20 mg/dL   Creatinine, Ser 8.91 (H) 0.44 - 1.00 mg/dL   Calcium 8.7 (L) 8.9 - 10.3 mg/dL   Total Protein 7.0 6.5 - 8.1 g/dL   Albumin 3.4 (L) 3.5 - 5.0 g/dL   AST 694 (H) 15 - 41 U/L   ALT 111 (H) 14 - 54 U/L   Alkaline Phosphatase 115 38 - 126 U/L   Total Bilirubin 0.4 0.3 - 1.2 mg/dL   GFR calc non Af Amer >60 >60 mL/min   GFR calc Af Amer >60 >60 mL/min   Anion gap 8 5 - 15  ECHOCARDIOGRAM COMPLETE   Collection Time: 05/03/16  6:03 PM  Result Value Ref Range   Weight 3,360 oz   Height 62 in   BP 152/93 mmHg   ECHO ejection fraction 60-65% Grade I LVH  Imaging Studies:     Currently EPIC will not allow sonographic studies to automatically populate into notes.  In the meantime, copy and paste results into note or free text.  Medications:  Scheduled . amLODipine  10 mg Oral BID  . labetalol  800 mg Oral TID  . oxytocin 40 units in LR 1000 mL  500 mL Intravenous Once  . pencillin G potassium IV  2.5 Million Units Intravenous Q4H   I have reviewed the patient's current medications.  ASSESSMENT: Patient Active Problem List   Diagnosis Date Noted  . Preeclampsia 05/03/2016  . Severe preeclampsia 05/03/2016  . Anemia in pregnancy 04/11/2016  . Creatinine  elevation 04/10/2016  . 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 Supervision of  high risk pregnancy, antepartum 01/12/2016  . HTN in pregnancy, chronic 01/12/2016  . Pseudotumor cerebri 07/31/2014  . 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 Severe obesity (BMI >= 40) (HCC) 06/22/2014  . Acanthosis nigricans 01/16/2012  . LVH (left ventricular hypertrophy) 01/16/2012  Grade I lvh  PLAN: Will proceed with cervical ripening via cytotec, with low indication requirements for intervening with cesarean delivery  Knight Oelkers V 05/03/2016,11:26 PM

## 2016-05-03 NOTE — Progress Notes (Signed)
  Echocardiogram 2D Echocardiogram has been performed.  Delcie Roch 05/03/2016, 6:04 PM

## 2016-05-03 NOTE — H&P (Signed)
ANTEPARTUM ADMISSION HISTORY AND PHYSICAL NOTE   History of Present Illness: Patty Gilmore is a 25 y.o. G1P0 at [redacted]w[redacted]d admitted for cHTN and SIPE.  Pt reports compliance with labetalol 800 TOD and amlodipine  BID. She took both of her morning doses today. Since 0100 today, she has had severe upper abdominal/chest pain that has moved to the RUQ and back. She denies HA or vision changes. She has vomited once. Patient reports the fetal movement as active. Patient reports uterine contraction  activity as none. Patient reports  vaginal bleeding as none. Patient describes fluid per vagina as None. Fetal presentation is unsure.  Patient Active Problem List   Diagnosis Date Noted  . Preeclampsia 05/03/2016  . Anemia in pregnancy 04/11/2016  . Creatinine elevation 04/10/2016  . 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 Supervision of high risk pregnancy, antepartum 01/12/2016  . HTN in pregnancy, chronic 01/12/2016  . Pseudotumor cerebri 07/31/2014  . 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 Severe obesity (BMI >= 40) (HCC) 06/22/2014  . Acanthosis nigricans 01/16/2012  . LVH (left ventricular hypertrophy) 01/16/2012    Past Medical History:  Diagnosis Date  . 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 Hypertension    was taking amlodipine stopped about 1 yr ago - no insurance - had been on meds since age 24  . Pregnancy induced hypertension     Past Surgical History:  Procedure Laterality Date  . LUMBAR PUNCTURE  07/20/2014   fluid build up around spinal column  . NO PAST SURGERIES      OB History  Gravida Para Term Preterm AB Living  1            SAB TAB Ectopic Multiple Live Births               # Outcome Date GA Lbr Len/2nd Weight Sex Delivery Anes PTL Lv  1 Current               Social History   Social History  . Marital status: Single    Spouse name: N/A  . Number of children: N/A  . 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 Smoking status: Never Smoker  . Smokeless tobacco: Never Used  . Alcohol use No     Comment: Rarely (less than once per month)  . Drug use:     Types: Marijuana  . Sexual activity: Yes    Birth control/ protection: None   Other Topics Concern  . None   Social History Narrative  . None    Family History  Problem Relation Age of Onset  . Diabetes Maternal Grandmother   . Diabetes Paternal Grandmother   . Thyroid disease Mother     No Known Allergies  Prescriptions Prior to Admission  Medication Sig Dispense Refill Last Dose  . acetaminophen (TYLENOL) 325 MG tablet Take 650 mg by mouth every 6 (six) hours as needed for headache. Reported on 01/13/2016   Past Week at Unknown time  . amLODipine (NORVASC) 10 MG tablet Take 1 tablet (10 mg total) by mouth 2 (two) times daily. 60 tablet 11 05/02/2016 at Unknown time  . labetalol (NORMODYNE) 200 MG tablet Take 4 tablets (800 mg total) by mouth 3 (three) times daily. 90 tablet 11 05/03/2016 at 0800  . Prenatal Vit-Fe Fumarate-FA (PRENATAL MULTIVITAMIN)  TABS tablet Take 1 tablet by mouth daily at 12 noon.   05/02/2016 at Unknown time  . aspirin EC 81 MG tablet Take 1 tablet (81 mg total) by mouth daily. (Patient not taking: Reported on 05/03/2016) 60 tablet 2 Not Taking at Unknown time     Vitals:  BP (!) 142/90   Pulse 77   Temp 97.9 F (36.6 C) (Oral)   Resp 16   Ht 5\' 2"  (1.575 m)   Wt 95.3 kg (210 lb)   LMP 10/11/2015   SpO2 100%   BMI 38.41 kg/m  Physical Examination: CONSTITUTIONAL: Well-developed, well-nourished female in no acute distress.  HENT:  Normocephalic, atraumatic, External right and left ear normal. Oropharynx is clear and moist EYES: Conjunctivae and EOM are normal. Pupils are equal, round, and reactive to light. No scleral icterus.  NECK: Normal range of motion, supple, no masses SKIN: Skin is warm and dry. No rash  noted. Not diaphoretic. No erythema. No pallor. NEUROLGIC: Alert and oriented to person, place, and time. Normal reflexes, muscle tone coordination. No cranial nerve deficit noted. PSYCHIATRIC: Normal mood and affect. Normal behavior. Normal judgment and thought content. CARDIOVASCULAR: Normal heart rate noted, regular rhythm RESPIRATORY: Effort and breath sounds normal, no problems with respiration noted ABDOMEN: Soft, nontender, nondistended, gravid. MUSCULOSKELETAL: Normal range of motion. No edema and no tenderness. 2+ distal pulses.  Cervix: deffered Fetal Monitoring:Baseline: 140 bpm Tocometer: Flat  Labs:  Results for orders placed or performed during the hospital encounter of 05/03/16 (from the past 24 hour(s))  Protein / creatinine ratio, urine   Collection Time: 05/03/16  8:55 AM  Result Value Ref Range   Creatinine, Urine 210.00 mg/dL   Total Protein, Urine 71 mg/dL   Protein Creatinine Ratio 0.34 (H) 0.00 - 0.15 mg/mg[Cre]  Urinalysis, Routine w reflex microscopic (not at [facility])   Collection Time: 05/03/16  8:55 AM  Result Value Ref Range   Color, Urine YELLOW YELLOW   APPearance CLEAR CLEAR   Specific Gravity, Urine 1.025 1.005 - 1.030   pH 5.5 5.0 - 8.0   Glucose, UA NEGATIVE NEGATIVE mg/dL   Hgb urine dipstick TRACE (A) NEGATIVE   Bilirubin Urine NEGATIVE NEGATIVE   Ketones, ur NEGATIVE NEGATIVE mg/dL   Protein, ur 30 (A) NEGATIVE mg/dL   Nitrite NEGATIVE NEGATIVE   Leukocytes, UA NEGATIVE NEGATIVE  Urine microscopic-add on   Collection Time: 05/03/16  8:55 AM  Result Value Ref Range   Squamous Epithelial / LPF 0-5 (A) NONE SEEN   WBC, UA 0-5 0 - 5 WBC/hpf   RBC / HPF 0-5 0 - 5 RBC/hpf   Bacteria, UA FEW (A) NONE SEEN   Urine-Other MUCOUS PRESENT   Comprehensive metabolic panel   Collection Time: 05/03/16  9:45 AM  Result Value Ref Range   Sodium 134 (L) 135 - 145 mmol/L   Potassium 4.1 3.5 - 5.1 mmol/L   Chloride 109 101 - 111 mmol/L   CO2 16 (L) 22 - 32  mmol/L   Glucose, Bld 79 65 - 99 mg/dL   BUN 14 6 - 20 mg/dL   Creatinine, Ser 6.94 (H) 0.44 - 1.00 mg/dL   Calcium 9.0 8.9 - 85.4 mg/dL   Total Protein 6.8 6.5 - 8.1 g/dL   Albumin 3.3 (L) 3.5 - 5.0 g/dL   AST 84 (H) 15 - 41 U/L   ALT 72 (H) 14 - 54 U/L   Alkaline Phosphatase 105 38 - 126 U/L   Total Bilirubin 0.5  0.3 - 1.2 mg/dL   GFR calc non Af Amer >60 >60 mL/min   GFR calc Af Amer >60 >60 mL/min   Anion gap 9 5 - 15  CBC   Collection Time: 05/03/16  9:45 AM  Result Value Ref Range   WBC 9.7 4.0 - 10.5 K/uL   RBC 2.91 (L) 3.87 - 5.11 MIL/uL   Hemoglobin 9.2 (L) 12.0 - 15.0 g/dL   HCT 16.1 (L) 09.6 - 04.5 %   MCV 89.3 78.0 - 100.0 fL   MCH 31.6 26.0 - 34.0 pg   MCHC 35.4 30.0 - 36.0 g/dL   RDW 40.9 81.1 - 91.4 %   Platelets 194 150 - 400 K/uL  Type and screen [facility] [place]   Collection Time: 05/03/16  9:45 AM  Result Value Ref Range   ABO/RH(D) O POS    Antibody Screen NEG    Sample Expiration 05/06/2016   ABO/Rh   Collection Time: 05/03/16  9:45 AM  Result Value Ref Range   ABO/RH(D) O POS     Imaging Studies: [place] Mfm Ob Follow Up  Result Date: 04/10/2016 OBSTETRICAL ULTRASOUND: This exam was performed within a [place] [facility]. The OB US report was generated in the AS system, and faxed to the ordering physician.  This report is available in the [employer]. See the AS Obstetric US report via the Image Link.    Assessment and Plan: Patient Active Problem List   Diagnosis Date Noted  . Preeclampsia 05/03/2016  . Anemia in pregnancy 04/11/2016  . Creatinine elevation 04/10/2016  . 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 Supervision of high risk pregnancy, antepartum 01/12/2016  . HTN in pregnancy, chronic 01/12/2016  . Pseudotumor cerebri 07/31/2014  . 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 Severe obesity (BMI >= 40) (HCC) 06/22/2014  . Acanthosis nigricans 01/16/2012  . LVH (left ventricular hypertrophy) 01/16/2012   Admit to  Antenatal #CHTN and SIPE: hydralazine HTN protocol; continue home meds;  continuous fetal monitoring -labs concerning for SIPE, will start 4g load of MgSO4 -deferred BMZ at this time 2/2 recent dose x2  Loni Muse, MD  CNM attestation:  I have seen and examined this patient; I agree with above documentation in the resident's note.   Patty Gilmore is a 25 y.o. G1P0 here for eval of upper abd/chest pain and vomiting  PE: BP (!) 152/93   Pulse 70   Temp 97.9 F (36.6 C) (Oral)   Resp 16   Ht 5\' 2"  (1.575 m)   Wt 95.3 kg (210 lb)   LMP 10/11/2015   SpO2 100%   BMI 38.41 kg/m  Gen: calm comfortable, NAD Resp: normal effort, no distress Abd: gravid  ROS, labs, PMH reviewed  A/Plan: IUP@29 .2wks cHTN now w/ severe SIPE  Will start Mag and admit Hydral prn for BPs as well as continue home labetalol and Norvasc MFM for consult and growth U/S (last 7/10)  SHAW, KIMBERLY CNM 05/03/2016, 4:26 PM

## 2016-05-03 NOTE — Progress Notes (Signed)
Fetal monitor put on hold while patient getting ECHO done

## 2016-05-04 ENCOUNTER — Inpatient Hospital Stay (HOSPITAL_COMMUNITY): Payer: Medicaid Other | Admitting: Anesthesiology

## 2016-05-04 ENCOUNTER — Encounter (HOSPITAL_COMMUNITY)
Admission: AD | Disposition: A | Discharge status: Home / Self Care | Payer: Self-pay | Source: Ambulatory Visit | Attending: Obstetrics and Gynecology

## 2016-05-04 ENCOUNTER — Encounter (HOSPITAL_COMMUNITY): Payer: Self-pay

## 2016-05-04 DIAGNOSIS — Z3A29 29 weeks gestation of pregnancy: Secondary | ICD-10-CM

## 2016-05-04 DIAGNOSIS — O9912 Other diseases of the blood and blood-forming organs and certain disorders involving the immune mechanism complicating childbirth: Secondary | ICD-10-CM

## 2016-05-04 DIAGNOSIS — O1414 Severe pre-eclampsia complicating childbirth: Secondary | ICD-10-CM

## 2016-05-04 LAB — COMPREHENSIVE METABOLIC PANEL
ALT: 100 U/L — ABNORMAL HIGH (ref 14–54)
ALT: 66 U/L — ABNORMAL HIGH (ref 14–54)
AST: 54 U/L — ABNORMAL HIGH (ref 15–41)
AST: 91 U/L — ABNORMAL HIGH (ref 15–41)
Albumin: 2.8 g/dL — ABNORMAL LOW (ref 3.5–5.0)
Albumin: 3.4 g/dL — ABNORMAL LOW (ref 3.5–5.0)
Alkaline Phosphatase: 112 U/L (ref 38–126)
Alkaline Phosphatase: 83 U/L (ref 38–126)
Anion gap: 8 (ref 5–15)
Anion gap: 8 (ref 5–15)
BUN: 12 mg/dL (ref 6–20)
BUN: 13 mg/dL (ref 6–20)
CO2: 18 mmol/L — ABNORMAL LOW (ref 22–32)
CO2: 19 mmol/L — ABNORMAL LOW (ref 22–32)
Calcium: 7.9 mg/dL — ABNORMAL LOW (ref 8.9–10.3)
Calcium: 8.5 mg/dL — ABNORMAL LOW (ref 8.9–10.3)
Chloride: 105 mmol/L (ref 101–111)
Chloride: 107 mmol/L (ref 101–111)
Creatinine, Ser: 1.26 mg/dL — ABNORMAL HIGH (ref 0.44–1.00)
Creatinine, Ser: 1.3 mg/dL — ABNORMAL HIGH (ref 0.44–1.00)
GFR calc Af Amer: >60 mL/min (ref >60)
GFR calc Af Amer: >60 mL/min (ref >60)
GFR calc non Af Amer: 57 mL/min — ABNORMAL LOW (ref >60)
GFR calc non Af Amer: 60 mL/min — ABNORMAL LOW (ref >60)
Glucose, Bld: 121 mg/dL — ABNORMAL HIGH (ref 65–99)
Glucose, Bld: 156 mg/dL — ABNORMAL HIGH (ref 65–99)
Potassium: 4.9 mmol/L (ref 3.5–5.1)
Potassium: 5.2 mmol/L — ABNORMAL HIGH (ref 3.5–5.1)
Sodium: 132 mmol/L — ABNORMAL LOW (ref 135–145)
Sodium: 133 mmol/L — ABNORMAL LOW (ref 135–145)
Total Bilirubin: 0.3 mg/dL (ref 0.3–1.2)
Total Bilirubin: 0.6 mg/dL (ref 0.3–1.2)
Total Protein: 6.1 g/dL — ABNORMAL LOW (ref 6.5–8.1)
Total Protein: 7.7 g/dL (ref 6.5–8.1)

## 2016-05-04 LAB — CBC WITH DIFFERENTIAL/PLATELET
Basophils Absolute: 0 10*3/uL (ref 0.0–0.1)
Basophils Relative: 0 %
Eosinophils Absolute: 0 10*3/uL (ref 0.0–0.7)
Eosinophils Relative: 0 %
HCT: 21.1 % — ABNORMAL LOW (ref 36.0–46.0)
Hemoglobin: 7.3 g/dL — ABNORMAL LOW (ref 12.0–15.0)
Lymphocytes Relative: 11 %
Lymphs Abs: 1.7 10*3/uL (ref 0.7–4.0)
MCH: 31.7 pg (ref 26.0–34.0)
MCHC: 34.6 g/dL (ref 30.0–36.0)
MCV: 91.7 fL (ref 78.0–100.0)
Monocytes Absolute: 0.4 10*3/uL (ref 0.1–1.0)
Monocytes Relative: 3 %
Neutro Abs: 12.9 10*3/uL — ABNORMAL HIGH (ref 1.7–7.7)
Neutrophils Relative %: 86 %
Platelets: 164 10*3/uL (ref 150–400)
RBC: 2.3 MIL/uL — ABNORMAL LOW (ref 3.87–5.11)
RDW: 13.6 % (ref 11.5–15.5)
WBC: 15 10*3/uL — ABNORMAL HIGH (ref 4.0–10.5)

## 2016-05-04 LAB — CBC
HCT: 27.3 % — ABNORMAL LOW (ref 36.0–46.0)
Hemoglobin: 9.7 g/dL — ABNORMAL LOW (ref 12.0–15.0)
MCH: 31.9 pg (ref 26.0–34.0)
MCHC: 35.5 g/dL (ref 30.0–36.0)
MCV: 89.8 fL (ref 78.0–100.0)
Platelets: 177 10*3/uL (ref 150–400)
RBC: 3.04 MIL/uL — ABNORMAL LOW (ref 3.87–5.11)
RDW: 13.3 % (ref 11.5–15.5)
WBC: 12.4 10*3/uL — ABNORMAL HIGH (ref 4.0–10.5)

## 2016-05-04 LAB — MAGNESIUM: Magnesium: 5.2 mg/dL — ABNORMAL HIGH (ref 1.7–2.4)

## 2016-05-04 SURGERY — Surgical Case
Anesthesia: Regional

## 2016-05-04 MED ORDER — HYDROMORPHONE HCL 1 MG/ML IJ SOLN
0.2500 mg | INTRAMUSCULAR | Status: DC | PRN
  Administered 2016-05-04 (×2): 0.25 mg via INTRAVENOUS

## 2016-05-04 MED ORDER — DEXAMETHASONE SODIUM PHOSPHATE 4 MG/ML IJ SOLN
INTRAMUSCULAR | Status: DC | PRN
  Administered 2016-05-04: 4 mg via INTRAVENOUS

## 2016-05-04 MED ORDER — WITCH HAZEL-GLYCERIN EX PADS: 1.0000 "application " | MEDICATED_PAD | CUTANEOUS | Status: DC | PRN

## 2016-05-04 MED ORDER — PHENYLEPHRINE 8 MG IN D5W 100 ML (0.08MG/ML) PREMIX OPTIME
INJECTION | INTRAVENOUS | Status: AC
  Filled 2016-05-04: qty 100

## 2016-05-04 MED ORDER — PHENYLEPHRINE 40 MCG/ML (10ML) SYRINGE FOR IV PUSH (FOR BLOOD PRESSURE SUPPORT)
PREFILLED_SYRINGE | INTRAVENOUS | Status: AC
Start: 2016-05-04 — End: 2016-05-04
  Filled 2016-05-04: qty 20

## 2016-05-04 MED ORDER — OXYCODONE HCL 5 MG/5ML PO SOLN: 5.0000 mg | Freq: Once | ORAL | Status: DC | PRN

## 2016-05-04 MED ORDER — PHENYLEPHRINE HCL 10 MG/ML IJ SOLN
INTRAMUSCULAR | Status: DC | PRN
  Administered 2016-05-04: 80 ug via INTRAVENOUS
  Administered 2016-05-04: 40 ug via INTRAVENOUS
  Administered 2016-05-04 (×2): 80 ug via INTRAVENOUS

## 2016-05-04 MED ORDER — IBUPROFEN 600 MG PO TABS
600.0000 mg | ORAL_TABLET | Freq: Four times a day (QID) | ORAL | Status: DC
  Administered 2016-05-04 – 2016-05-06 (×9): 600 mg via ORAL
  Filled 2016-05-04 (×9): qty 1

## 2016-05-04 MED ORDER — OXYCODONE-ACETAMINOPHEN 5-325 MG PO TABS
1.0000 | ORAL_TABLET | ORAL | Status: DC | PRN
  Administered 2016-05-05: 1 via ORAL
  Filled 2016-05-04: qty 1

## 2016-05-04 MED ORDER — ONDANSETRON HCL 4 MG/2ML IJ SOLN: 4.0000 mg | Freq: Once | INTRAMUSCULAR | Status: DC | PRN

## 2016-05-04 MED ORDER — DIPHENHYDRAMINE HCL 25 MG PO CAPS
25.0000 mg | ORAL_CAPSULE | Freq: Four times a day (QID) | ORAL | Status: DC | PRN
  Filled 2016-05-04: qty 1

## 2016-05-04 MED ORDER — SUCCINYLCHOLINE CHLORIDE 20 MG/ML IJ SOLN
INTRAMUSCULAR | Status: DC | PRN
Start: 2016-05-04 — End: 2016-05-04
  Administered 2016-05-04: 120 mg via INTRAVENOUS

## 2016-05-04 MED ORDER — HYDROMORPHONE HCL 1 MG/ML IJ SOLN
INTRAMUSCULAR | Status: AC
  Administered 2016-05-04: 0.25 mg via INTRAVENOUS
  Filled 2016-05-04: qty 1

## 2016-05-04 MED ORDER — LACTATED RINGERS IV SOLN
INTRAVENOUS | Status: DC | PRN
  Administered 2016-05-04: 40 [IU] via INTRAVENOUS

## 2016-05-04 MED ORDER — MORPHINE SULFATE (PF) 0.5 MG/ML IJ SOLN
INTRAMUSCULAR | Status: AC
  Filled 2016-05-04: qty 10

## 2016-05-04 MED ORDER — LACTATED RINGERS IV SOLN
INTRAVENOUS | Status: DC | PRN
  Administered 2016-05-04: 02:00:00 via INTRAVENOUS

## 2016-05-04 MED ORDER — ONDANSETRON HCL 4 MG/2ML IJ SOLN
INTRAMUSCULAR | Status: AC
  Filled 2016-05-04: qty 2

## 2016-05-04 MED ORDER — DEXAMETHASONE SODIUM PHOSPHATE 4 MG/ML IJ SOLN
INTRAMUSCULAR | Status: AC
  Filled 2016-05-04: qty 1

## 2016-05-04 MED ORDER — TERBUTALINE SULFATE 1 MG/ML IJ SOLN: 0.2500 mg | Freq: Once | INTRAMUSCULAR | Status: DC | PRN

## 2016-05-04 MED ORDER — SIMETHICONE 80 MG PO CHEW
80.0000 mg | CHEWABLE_TABLET | ORAL | Status: DC
  Administered 2016-05-05 (×2): 80 mg via ORAL
  Filled 2016-05-04 (×3): qty 1

## 2016-05-04 MED ORDER — SODIUM CHLORIDE 0.9 % IR SOLN
Status: DC | PRN
  Administered 2016-05-04: 1

## 2016-05-04 MED ORDER — FENTANYL CITRATE (PF) 250 MCG/5ML IJ SOLN
INTRAMUSCULAR | Status: DC | PRN
  Administered 2016-05-04: 100 ug via INTRAVENOUS
  Administered 2016-05-04: 250 ug via INTRAVENOUS

## 2016-05-04 MED ORDER — MEPERIDINE HCL 25 MG/ML IJ SOLN: 6.2500 mg | INTRAMUSCULAR | Status: DC | PRN

## 2016-05-04 MED ORDER — MISOPROSTOL 25 MCG QUARTER TABLET
25.0000 ug | ORAL_TABLET | ORAL | Status: DC | PRN
  Administered 2016-05-04: 25 ug via VAGINAL
  Filled 2016-05-04: qty 0.25

## 2016-05-04 MED ORDER — PROPOFOL 10 MG/ML IV BOLUS
INTRAVENOUS | Status: DC | PRN
  Administered 2016-05-04: 200 mg via INTRAVENOUS

## 2016-05-04 MED ORDER — FENTANYL CITRATE (PF) 250 MCG/5ML IJ SOLN
INTRAMUSCULAR | Status: AC
  Filled 2016-05-04: qty 5

## 2016-05-04 MED ORDER — OXYTOCIN 40 UNITS IN LACTATED RINGERS INFUSION - SIMPLE MED: 2.5000 [IU]/h | INTRAVENOUS | Status: AC

## 2016-05-04 MED ORDER — SENNOSIDES-DOCUSATE SODIUM 8.6-50 MG PO TABS
2.0000 | ORAL_TABLET | ORAL | Status: DC
  Administered 2016-05-05 – 2016-05-06 (×3): 2 via ORAL
  Filled 2016-05-04 (×5): qty 2

## 2016-05-04 MED ORDER — LIDOCAINE HCL (CARDIAC) 20 MG/ML IV SOLN
INTRAVENOUS | Status: DC | PRN
  Administered 2016-05-04: 80 mg via INTRAVENOUS

## 2016-05-04 MED ORDER — ZOLPIDEM TARTRATE 5 MG PO TABS: 5.0000 mg | ORAL_TABLET | Freq: Every evening | ORAL | Status: DC | PRN

## 2016-05-04 MED ORDER — MENTHOL 3 MG MT LOZG
1.0000 | LOZENGE | OROMUCOSAL | Status: DC | PRN
  Filled 2016-05-04: qty 9

## 2016-05-04 MED ORDER — FENTANYL CITRATE (PF) 100 MCG/2ML IJ SOLN
INTRAMUSCULAR | Status: AC
  Filled 2016-05-04: qty 2

## 2016-05-04 MED ORDER — SIMETHICONE 80 MG PO CHEW
80.0000 mg | CHEWABLE_TABLET | Freq: Three times a day (TID) | ORAL | Status: DC
  Administered 2016-05-04 – 2016-05-07 (×10): 80 mg via ORAL
  Filled 2016-05-04 (×16): qty 1

## 2016-05-04 MED ORDER — LACTATED RINGERS IV SOLN
INTRAVENOUS | Status: DC
  Administered 2016-05-04 (×4): via INTRAVENOUS

## 2016-05-04 MED ORDER — SIMETHICONE 80 MG PO CHEW
80.0000 mg | CHEWABLE_TABLET | ORAL | Status: DC | PRN
  Filled 2016-05-04 (×3): qty 1

## 2016-05-04 MED ORDER — HYDROMORPHONE HCL 1 MG/ML IJ SOLN
INTRAMUSCULAR | Status: AC
Start: 2016-05-04 — End: 2016-05-04
  Filled 2016-05-04: qty 1

## 2016-05-04 MED ORDER — PRENATAL MULTIVITAMIN CH
1.0000 | ORAL_TABLET | Freq: Every day | ORAL | Status: DC
  Administered 2016-05-05 – 2016-05-07 (×3): 1 via ORAL
  Filled 2016-05-04 (×5): qty 1

## 2016-05-04 MED ORDER — MIDAZOLAM HCL 2 MG/2ML IJ SOLN
INTRAMUSCULAR | Status: AC
  Filled 2016-05-04: qty 2

## 2016-05-04 MED ORDER — SCOPOLAMINE 1 MG/3DAYS TD PT72
MEDICATED_PATCH | TRANSDERMAL | Status: AC
  Filled 2016-05-04: qty 1

## 2016-05-04 MED ORDER — COCONUT OIL OIL
1.0000 "application " | TOPICAL_OIL | Status: DC | PRN
  Filled 2016-05-04: qty 120

## 2016-05-04 MED ORDER — TETANUS-DIPHTH-ACELL PERTUSSIS 5-2.5-18.5 LF-MCG/0.5 IM SUSP
0.5000 mL | Freq: Once | INTRAMUSCULAR | Status: DC
  Filled 2016-05-04: qty 0.5

## 2016-05-04 MED ORDER — OXYCODONE HCL 5 MG PO TABS: 5.0000 mg | ORAL_TABLET | Freq: Once | ORAL | Status: DC | PRN

## 2016-05-04 MED ORDER — LABETALOL HCL 300 MG PO TABS
800.0000 mg | ORAL_TABLET | Freq: Two times a day (BID) | ORAL | Status: DC
  Filled 2016-05-04: qty 1

## 2016-05-04 MED ORDER — CEFAZOLIN SODIUM-DEXTROSE 2-4 GM/100ML-% IV SOLN
INTRAVENOUS | Status: AC
  Filled 2016-05-04: qty 100

## 2016-05-04 MED ORDER — MIDAZOLAM HCL 2 MG/2ML IJ SOLN
INTRAMUSCULAR | Status: DC | PRN
  Administered 2016-05-04: 2 mg via INTRAVENOUS

## 2016-05-04 MED ORDER — ACETAMINOPHEN 325 MG PO TABS
650.0000 mg | ORAL_TABLET | ORAL | Status: DC | PRN
  Administered 2016-05-06 – 2016-05-07 (×2): 650 mg via ORAL
  Filled 2016-05-04 (×2): qty 2

## 2016-05-04 MED ORDER — OXYTOCIN 10 UNIT/ML IJ SOLN
INTRAMUSCULAR | Status: AC
  Filled 2016-05-04: qty 4

## 2016-05-04 MED ORDER — DIBUCAINE 1 % RE OINT: 1.0000 "application " | TOPICAL_OINTMENT | RECTAL | Status: DC | PRN

## 2016-05-04 MED ORDER — CEFAZOLIN SODIUM-DEXTROSE 2-3 GM-% IV SOLR
INTRAVENOUS | Status: DC | PRN
  Administered 2016-05-04: 2 g via INTRAVENOUS

## 2016-05-04 MED ORDER — HYDROMORPHONE HCL 1 MG/ML IJ SOLN
INTRAMUSCULAR | Status: DC | PRN
  Administered 2016-05-04: 1 mg via INTRAVENOUS

## 2016-05-04 MED ORDER — OXYCODONE-ACETAMINOPHEN 5-325 MG PO TABS
2.0000 | ORAL_TABLET | ORAL | Status: DC | PRN
  Administered 2016-05-04 – 2016-05-05 (×3): 2 via ORAL
  Filled 2016-05-04 (×3): qty 2

## 2016-05-04 SURGICAL SUPPLY — 36 items
APL SKNCLS STERI-STRIP NONHPOA (GAUZE/BANDAGES/DRESSINGS) ×1
BENZOIN TINCTURE PRP APPL 2/3 (GAUZE/BANDAGES/DRESSINGS) ×1 IMPLANT
CHLORAPREP W/TINT 26ML (MISCELLANEOUS) ×2 IMPLANT
CLAMP CORD UMBIL (MISCELLANEOUS) IMPLANT
CLOTH BEACON ORANGE TIMEOUT ST (SAFETY) ×2 IMPLANT
DRSG OPSITE POSTOP 4X10 (GAUZE/BANDAGES/DRESSINGS) ×2 IMPLANT
ELECT REM PT RETURN 9FT ADLT (ELECTROSURGICAL) ×2
ELECTRODE REM PT RTRN 9FT ADLT (ELECTROSURGICAL) ×1 IMPLANT
EXTRACTOR VACUUM KIWI (MISCELLANEOUS) IMPLANT
GLOVE BIO SURGEON ST LM GN SZ9 (GLOVE) ×2 IMPLANT
GLOVE BIOGEL PI IND STRL 7.0 (GLOVE) ×1 IMPLANT
GLOVE BIOGEL PI IND STRL 9 (GLOVE) ×1 IMPLANT
GLOVE BIOGEL PI INDICATOR 7.0 (GLOVE) ×1
GLOVE BIOGEL PI INDICATOR 9 (GLOVE) ×1
GOWN STRL REUS W/TWL 2XL LVL3 (GOWN DISPOSABLE) ×2 IMPLANT
GOWN STRL REUS W/TWL LRG LVL3 (GOWN DISPOSABLE) ×2 IMPLANT
NEEDLE HYPO 25X5/8 SAFETYGLIDE (NEEDLE) IMPLANT
NS IRRIG 1000ML POUR BTL (IV SOLUTION) ×2 IMPLANT
PACK C SECTION WH (CUSTOM PROCEDURE TRAY) ×2 IMPLANT
PAD OB MATERNITY 4.3X12.25 (PERSONAL CARE ITEMS) ×2 IMPLANT
PENCIL SMOKE EVAC W/HOLSTER (ELECTROSURGICAL) ×2 IMPLANT
RTRCTR C-SECT PINK 25CM LRG (MISCELLANEOUS) IMPLANT
RTRCTR C-SECT PINK 34CM XLRG (MISCELLANEOUS) IMPLANT
STRIP CLOSURE SKIN 1/2X4 (GAUZE/BANDAGES/DRESSINGS) ×1 IMPLANT
SUT MNCRL 0 VIOLET CTX 36 (SUTURE) ×2 IMPLANT
SUT MONOCRYL 0 CTX 36 (SUTURE) ×2
SUT PLAIN 2 0 (SUTURE) ×2
SUT PLAIN ABS 2-0 CT1 27XMFL (SUTURE) IMPLANT
SUT VIC AB 0 CT1 27 (SUTURE) ×2
SUT VIC AB 0 CT1 27XBRD ANBCTR (SUTURE) ×1 IMPLANT
SUT VIC AB 2-0 CT1 27 (SUTURE) ×2
SUT VIC AB 2-0 CT1 TAPERPNT 27 (SUTURE) ×1 IMPLANT
SUT VIC AB 4-0 KS 27 (SUTURE) ×2 IMPLANT
SYR BULB IRRIGATION 50ML (SYRINGE) IMPLANT
TOWEL OR 17X24 6PK STRL BLUE (TOWEL DISPOSABLE) ×2 IMPLANT
TRAY FOLEY CATH SILVER 14FR (SET/KITS/TRAYS/PACK) ×2 IMPLANT

## 2016-05-04 NOTE — Anesthesia Preprocedure Evaluation (Signed)
Anesthesia Evaluation  Patient identified by MRN, date of birth, ID band Patient awake    Reviewed: Allergy & Precautions, NPO status , Patient's Chart, lab work & pertinent test results  Airway Mallampati: II  TM Distance: >3 FB Neck ROM: Full    Dental  (+) Teeth Intact   Pulmonary    breath sounds clear to auscultation       Cardiovascular hypertension,  Rhythm:Regular Rate:Normal     Neuro/Psych    GI/Hepatic   Endo/Other  Morbid obesity  Renal/GU      Musculoskeletal   Abdominal   Peds  Hematology   Anesthesia Other Findings   Reproductive/Obstetrics (+) Pregnancy                             Anesthesia Physical Anesthesia Plan  ASA: II and emergent  Anesthesia Plan: General   Post-op Pain Management:    Induction: Intravenous  Airway Management Planned: Oral ETT  Additional Equipment:   Intra-op Plan:   Post-operative Plan: Extubation in OR  Informed Consent: I have reviewed the patients History and Physical, chart, labs and discussed the procedure including the risks, benefits and alternatives for the proposed anesthesia with the patient or authorized representative who has indicated his/her understanding and acceptance.     Plan Discussed with: CRNA, Anesthesiologist and Surgeon  Anesthesia Plan Comments: (FHR was 120 by Dr. Emelda Fear so SAB was attempted but I was unable to find CSF and GA was planned.)        Anesthesia Quick Evaluation

## 2016-05-04 NOTE — Brief Op Note (Addendum)
05/03/2016 - 05/04/2016  4:08 AM  PATIENT:  Patty Gilmore  25 y.o. female  PRE-OPERATIVE DIAGNOSIS:  Primary Cesarean Section Fetal Indication fetal intolerance of labor induction  POST-OPERATIVE DIAGNOSIS:  Primary Cesarean Section Fetal Indication  PROCEDURE:  Procedure(s): CESAREAN SECTION (N/A)  Low transverse SURGEON:  Surgeon(s) and Role:    * Tilda Burrow, MD - Primary  PHYSICIAN ASSISTANT:   ASSISTANTS: none   ANESTHESIA:   general  EBL:  Total I/O In: 2375 [P.O.:150; I.V.:2225] Out: 1750 [Urine:800; Emesis/NG output:150; Blood:800]  BLOOD ADMINISTERED:none  DRAINS: Urinary Catheter (Foley)   LOCAL MEDICATIONS USED:  NONE  SPECIMEN:  Source of Specimen:  Placenta to labor and delivery correction to pathology  DISPOSITION OF SPECIMEN:  PATHOLOGY  COUNTS:  YES  TOURNIQUET:  * No tourniquets in log *  DICTATION: .Dragon Dictation  PLAN OF CARE: Has admission orders already  PATIENT DISPOSITION:  PACU - hemodynamically stable.   Delay start of Pharmacological VTE agent (>24hrs) due to surgical blood loss or risk of bleeding: not applicable Indications: Attempted induction of labor due to's chronic hypertension, superimposed severe preeclampsia in a patient with prematurity. The placental function showed absent end-diastolic flow. On Dopplers. The baby showed spontaneous decelerations as induction was being begun with cervical ripening agent so was taken to the OR for cesarean delivery. Spinal anesthesia was unable to be achieved in a timely prompt fashion, general anesthesia was indicated and utilized. Details of procedure patient was taken operating room fetal heart rate documented in the room as approximate 120 by ultrasound by me. Patient was then sat up and efforts at spinal anesthesia were unsuccessful over about 10 minutes. Request converted to general anesthesia. After general anesthesia induced for the abdomen artery prepped and Foley catheter in place,  transverse lower abdominal incision was made with sharp dissection down into the abdominal cavity in standard Pfannenstiel incision technique. Transverse uterine incision was made above the bladder flap, at the level of the lower edge of the placenta. Begin to separate and the baby and the placenta were delivered together, membranes ruptured and the baby passed to the waiting pediatricians. The baby did cry once during the time it was undermined supervision. See pediatricians for details. Blood gases were obtained. PH returned 7.19 Uterus was irrigated, closed with running locking 0 Monocryl followed by an oversewing over sewing second layer of 0 Monocryl with good hemostasis. Abdomen was irrigated. Anterior peritoneum was closed with 2-0 Vicryl. Fascia was closed with 0 Vicryl after a uterine abdominal wall perforating vessel on the left rectus muscles was separately ligated. 16th tissues were irrigated reapproximated with 3 interrupted sutures of 20 plain and then subcuticular 4-0 Vicryl close the skin incision patient tolerated procedure well was allowed to waken up blood pressures were low and maternal bradycardia remained present during the case patient went to recovery room where she'll will be resumed on magnesium sulfate at 1 g per hour to complete a 24-hour post course sponge and needle counts correct

## 2016-05-04 NOTE — Anesthesia Procedure Notes (Signed)
Procedure Name: Intubation Date/Time: 05/04/2016 2:20 AM Performed by: Elbert Ewings Pre-anesthesia Checklist: Patient identified, Emergency Drugs available, Suction available, Patient being monitored and Timeout performed Patient Re-evaluated:Patient Re-evaluated prior to inductionOxygen Delivery Method: Circle system utilized Preoxygenation: Pre-oxygenation with 100% oxygen Intubation Type: Rapid sequence, IV induction and Cricoid Pressure applied Laryngoscope Size: Glidescope and 3 Grade View: Grade II Tube type: Oral Tube size: 7.0 mm Number of attempts: 1 Airway Equipment and Method: Video-laryngoscopy and Stylet Placement Confirmation: ETT inserted through vocal cords under direct vision,  positive ETCO2 and breath sounds checked- equal and bilateral Secured at: 21 cm Tube secured with: Tape Dental Injury: Teeth and Oropharynx as per pre-operative assessment

## 2016-05-04 NOTE — Op Note (Signed)
Please see the brief operative note for surgical details 

## 2016-05-04 NOTE — Anesthesia Procedure Notes (Signed)
Performed by: Tarron Krolak S       

## 2016-05-04 NOTE — Lactation Note (Signed)
This note was copied from a baby's chart. Lactation Consultation Note Initial visit at 19 hours of age.  Baby is in NICU at [redacted]w[redacted]d gestation.  Mom has WIC with [facility] and attended breastfeeding classes there.  Mom reports attempting to pump earlier and didn't see colostrum. LC assisted with hand expression and easily expressed about 10ml. LC reviewed pumping 8X/24 hours and cleaning of supplies.  Mom will pump for 15 minutes on initiate phases and then work on hand expression.  Colostrum containers given.  LC gave mom yellow #s to label each time she is collecting EBM for baby.  Mom to call for assist as needed.     Patient Name: Patty Gilmore XJOIT'G Date: 05/04/2016 Reason for consult: Initial assessment   Maternal Data Has patient been taught Hand Expression?: Yes Does the patient have breastfeeding experience prior to this delivery?: No  Feeding    LATCH Score/Interventions                      Lactation Tools Discussed/Used Pump Review: Setup, frequency, and cleaning;Milk Storage Initiated by:: JS Date initiated:: 05/04/16   Consult Status Consult Status: Follow-up Date: 05/05/16 Follow-up type: In-patient    Jannifer Rodney 05/04/2016, 9:48 PM

## 2016-05-04 NOTE — Anesthesia Postprocedure Evaluation (Signed)
Anesthesia Post Note  Patient: Patty Gilmore  Procedure(s) Performed: Procedure(s) (LRB): CESAREAN SECTION (N/A)  Patient location during evaluation: Antenatal Anesthesia Type: General Level of consciousness: awake, awake and alert, oriented and patient cooperative Pain management: pain level controlled Vital Signs Assessment: post-procedure vital signs reviewed and stable Respiratory status: spontaneous breathing, nonlabored ventilation, respiratory function stable and patient connected to nasal cannula oxygen Cardiovascular status: stable Postop Assessment: patient able to bend at knees, no headache, no backache and no signs of nausea or vomiting Anesthetic complications: no     Last Vitals:  Vitals:   05/04/16 0653 05/04/16 0744  BP:    Pulse: (!) 59   Resp: 14   Temp:  36.4 C    Last Pain:  Vitals:   05/04/16 0744  TempSrc: Oral  PainSc:    Pain Goal: Patients Stated Pain Goal: 8 (05/03/16 1206)               Nickalos Petersen L

## 2016-05-04 NOTE — Transfer of Care (Signed)
Immediate Anesthesia Transfer of Care Note  Patient: Patty Gilmore  Procedure(s) Performed: Procedure(s): CESAREAN SECTION (N/A)  Patient Location: PACU  Anesthesia Type:General  Level of Consciousness: sedated  Airway & Oxygen Therapy: Patient Spontanous Breathing and Patient connected to nasal cannula oxygen  Post-op Assessment: Report given to RN and Post -op Vital signs reviewed and stable  Post vital signs: Reviewed and stable  Last Vitals:  Vitals:   05/04/16 0100 05/04/16 0130  BP: 131/81 127/69  Pulse: 78 68  Resp: 18 16  Temp:      Last Pain:  Vitals:   05/04/16 0100  TempSrc:   PainSc: 0-No pain      Patients Stated Pain Goal: 8 (05/03/16 1206)  Complications: No apparent anesthesia complications

## 2016-05-05 MED ORDER — AMLODIPINE BESYLATE 10 MG PO TABS
10.0000 mg | ORAL_TABLET | Freq: Every day | ORAL | Status: DC
  Administered 2016-05-05 – 2016-05-07 (×3): 10 mg via ORAL
  Filled 2016-05-05 (×4): qty 1

## 2016-05-05 NOTE — Progress Notes (Signed)
Subjective: Postpartum Day 1: Cesarean Delivery Patient reports incisional pain, tolerating PO and no problems voiding.    Objective: Vital signs in last 24 hours: Temp:  [97.9 F (36.6 C)-98.6 F (37 C)] 98.4 F (36.9 C) (08/04 0413) Pulse Rate:  [60-82] 71 (08/04 0619) Resp:  [15-18] 18 (08/04 0619) BP: (113-155)/(70-89) 136/80 (08/04 0619) SpO2:  [97 %-100 %] 97 % (08/03 1221)  Physical Exam:  General: alert, cooperative and no distress  Heart: regular rate, no murmur Lungs: clear to auscultation bilaterally, no wheezing. Lochia: appropriate Uterine Fundus: firm Incision: no dehiscence, no significant erythema DVT Evaluation: No evidence of DVT seen on physical exam.   Recent Labs  05/03/16 2350 05/04/16 1135  HGB 9.7* 7.3*  HCT 27.3* 21.1*    Assessment/Plan: Status post Cesarean section. Doing well postoperatively.  S/p magnesium.   Start amlodipine 10mg  daily Transfer to 3rd floor. Continue current care.  Patty Gilmore JEHIEL 05/05/2016, 7:47 AM

## 2016-05-05 NOTE — Lactation Note (Signed)
This note was copied from a baby's chart. Lactation Consultation Note  Patient Name: Patty Gilmore JSEGB'T Date: 05/05/2016 Reason for consult: Follow-up assessment;NICU baby;Infant < 6lbs   Follow up with mom with a 32 hour old NICU infant. Mom was pumping when I went into the room and was not getting colostrum but noted she gets more with hand expression after pumping. She has large breasts and large diameter nipples and is using # 30 flanges for pumping.   Mom reports this is the first time she has pumped today. She is aware that she needs to pump every 2-3 hours. Reviewed pumping Schedule and milk storage in Taking Care of Baby and Me Booklet. Enc mom to pump after visiting infant and informed her of NICU pumping rooms.   Mom is a [facility] client and plans to rent a pump prior to d/c. Pump rental paperwork given with instructions and [facility] referral faxed to [facility] office. Will follow up tomorrow and prn.    Maternal Data Has patient been taught Hand Expression?: Yes Does the patient have breastfeeding experience prior to this delivery?: No  Feeding    LATCH Score/Interventions                      Lactation Tools Discussed/Used WIC Program: Yes [facility]) Pump Review: Setup, frequency, and cleaning;Milk Storage Initiated by:: Reviewed   Consult Status Consult Status: Follow-up Date: 05/06/16 Follow-up type: In-patient    Silas Flood Hice 05/05/2016, 10:32 AM

## 2016-05-05 NOTE — Progress Notes (Signed)
Patient transferring to third floor women's unit for PP care. Report given to Salena Saner, RN, pt will travel via wheelchair to room 319.

## 2016-05-06 LAB — COMPREHENSIVE METABOLIC PANEL
ALT: 36 U/L (ref 14–54)
AST: 25 U/L (ref 15–41)
Albumin: 3.1 g/dL — ABNORMAL LOW (ref 3.5–5.0)
Alkaline Phosphatase: 81 U/L (ref 38–126)
Anion gap: 5 (ref 5–15)
BUN: 12 mg/dL (ref 6–20)
CO2: 24 mmol/L (ref 22–32)
Calcium: 8.5 mg/dL — ABNORMAL LOW (ref 8.9–10.3)
Chloride: 106 mmol/L (ref 101–111)
Creatinine, Ser: 1.11 mg/dL — ABNORMAL HIGH (ref 0.44–1.00)
GFR calc Af Amer: >60 mL/min (ref >60)
GFR calc non Af Amer: >60 mL/min (ref >60)
Glucose, Bld: 84 mg/dL (ref 65–99)
Potassium: 4.6 mmol/L (ref 3.5–5.1)
Sodium: 135 mmol/L (ref 135–145)
Total Bilirubin: 0.3 mg/dL (ref 0.3–1.2)
Total Protein: 6.4 g/dL — ABNORMAL LOW (ref 6.5–8.1)

## 2016-05-06 LAB — CBC
HCT: 18.7 % — ABNORMAL LOW (ref 36.0–46.0)
Hemoglobin: 6.4 g/dL — CL (ref 12.0–15.0)
MCH: 31.7 pg (ref 26.0–34.0)
MCHC: 34.2 g/dL (ref 30.0–36.0)
MCV: 92.6 fL (ref 78.0–100.0)
Platelets: 170 10*3/uL (ref 150–400)
RBC: 2.02 MIL/uL — ABNORMAL LOW (ref 3.87–5.11)
RDW: 13.8 % (ref 11.5–15.5)
WBC: 17.3 10*3/uL — ABNORMAL HIGH (ref 4.0–10.5)

## 2016-05-06 LAB — RPR: RPR Ser Ql: NONREACTIVE

## 2016-05-06 MED ORDER — FERROUS SULFATE 325 (65 FE) MG PO TABS
325.0000 mg | ORAL_TABLET | Freq: Two times a day (BID) | ORAL | Status: DC
  Administered 2016-05-06 – 2016-05-07 (×3): 325 mg via ORAL
  Filled 2016-05-06 (×3): qty 1

## 2016-05-06 NOTE — Progress Notes (Signed)
POSTPARTUM PROGRESS NOTE  Post Operative Day #2 Subjective:  Patty Gilmore is a 25 y.o. G1P0101 [redacted]w[redacted]d s/p PLTCS.  No acute events overnight.  Pt denies problems with ambulating, voiding or po intake.  She denies nausea or vomiting.  Pain is well controlled.  She has had flatus. She has had bowel movement.  Lochia Minimal.   Pt has no complaints. HgB was found to be 6.4 today, but pt denies any lightheadedness or dizziness.  Objective: Blood pressure 137/87, pulse 75, temperature 98.6 F (37 C), temperature source Oral, resp. rate 14, height 5\' 2"  (1.575 m), weight 216 lb 0.1 oz (98 kg), last menstrual period 10/11/2015, SpO2 100 %, unknown if currently breastfeeding.  Physical Exam:  General: alert, cooperative and no distress Lochia:normal flow Abdomen: +BS, soft, nontender,  Uterine Fundus: firm appropirately tender DVT Evaluation: No calf swelling or tenderness Extremities: trace edema   Recent Labs  05/04/16 1135 05/06/16 0529  HGB 7.3* 6.4*  HCT 21.1* 18.7*    Assessment/Plan:  ASSESSMENT: Patty Gilmore is a 25 y.o. G1P0101 [redacted]w[redacted]d s/p PLTCS for cHTN with SIPE.   #cHTN w/ SIPE: S/P Mag: labs (Plts, LFTs and Cr) significantly improved today. Will recheck labs tomorrow AM. On Norvasc minimally elevated BP's no severe range BP if remain elevated this PM consider add HCTZ  #Anemia Pt is a symptomatic. Started on Iron     LOS: 3 days   Ernestina Penna 05/06/2016, 6:52 AM

## 2016-05-06 NOTE — Clinical Social Work Maternal (Signed)
CLINICAL SOCIAL WORK MATERNAL/CHILD NOTE  Patient Details  Name: Patty Gilmore MRN: [id number] Date of Birth: 10/12/1990  Date:  05/06/2016  Clinical Social Worker Initiating Note:  Aryana Wonnacott, LCSW              Date/ Time Initiated:  05/06/16/1425                        Child's Name:  Patty Gilmore   Legal Guardian:  Mother   Need for Interpreter:  None   Date of Referral:  05/05/16     Reason for Referral:  Parental Support of Premature Babies < 32 weeks/or Critically Ill babies    Referral Source:  NICU   Address:  [address] 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 MOB was attentive and alert but present with flat affect. CSW noted that MOB was also pumping at time of the assessment. MOB reported that she has basic needs met and supportive friends and family. MOB indicated that she applied for public housing in April and awaiting feedback. MOB currently lives in the home with mother, grandmother, sister and brother. MOB inquired about taking out child support on FOB. MOB reported that she already had application but she just needed to complete. MOB was offered counseling support but declined.   CSW Plan/Description:  Information/Referral to Community Resources  CSW recommended MOB to follow up with housing authority regarding housing and Child support office for additional support. CSW informed MOB that due to medical issues of infant, she may qualify for financial support.    Marquett Bertoli R, LCSW 05/06/2016, 3:03 PM   CLINICAL SOCIAL WORK MATERNAL/CHILD NOTE  Patient Details  Name: Patty Gilmore MRN: [id number] Date of Birth: 04/19/1991  Date:  05/06/2016  Clinical Social Worker Initiating Note:  Rigoberto Noel, LCSW Date/ Time Initiated:  05/06/16/1425     Child's Name:  Patty Gilmore   Legal Guardian:  Mother   Need for Interpreter:  None   Date of Referral:  05/05/16     Reason for Referral:  Parental Support of Premature Babies < 38 weeks/or Critically Ill babies    Referral Source:  NICU   Address:  [address] 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 MOB was attentive and alert but present with flat affect. CSW noted that MOB was also pumping at time of the assessment. MOB reported that she has basic needs met and supportive friends and family. MOB indicated that she applied for public housing in April and awaiting feedback. MOB currently lives in the home with mother, grandmother, sister and brother. MOB inquired about taking out child support on FOB. MOB reported that she already had application but she just needed to complete. MOB was offered counseling support but declined.   CSW Plan/Description:  Information/Referral to [employer]  CSW recommended  MOB to follow up with housing authority regarding housing and Child support office for additional support. CSW informed MOB that due to medical issues of infant, she may qualify for financial support.    Rigoberto Noel R, LCSW 05/06/2016, 3:03 PM

## 2016-05-06 NOTE — Lactation Note (Signed)
This note was copied from a baby's chart. Lactation Consultation Note  Patient Name: Patty Gilmore XHFSF'S Date: 05/06/2016 Reason for consult: Follow-up assessment;NICU baby;Infant < 6lbs  LC followed-up with mom in her room. NICU baby is 40 hours old. Mom stated she has been pumping q 2-3 hours on the initiate setting followed by hand expression and lately has been getting 10-15 mL/ breast. Mom stated she is hoping to do some skin-to-skin soon.  Mom has appt with [facility] 05/08/16 to get DEBP but is wanting to do a hospital [facility] loaner pump if discharged tomorrow (05/07/16) - pt has paperwork & knows to ask for [facility] tomorrow before discharge to finish paperwork and get pump. Mom reports no questions at this time & knows to call if she has any. Also encouraged mom to seek LC help in NICU when they tell her she can BF baby.  Maternal Data    Feeding Feeding Type: Breast Milk Length of feed: 5 min  LATCH Score/Interventions                      Lactation Tools Discussed/Used WIC Program: Yes   Consult Status Consult Status: Follow-up Date: 05/07/16 Follow-up type: In-patient    Oneal Grout 05/06/2016, 11:29 AM

## 2016-05-06 NOTE — Clinical Social Work Note (Addendum)
CSW attempted to complete assessment with MOB. MOB was not in the room. CSW was informed by guest that mother was visiting baby. CSW will attempt to complete later.  Nira Retort, MSW, LCSW Clinical Social Worker

## 2016-05-07 LAB — COMPREHENSIVE METABOLIC PANEL
ALT: 38 U/L (ref 14–54)
AST: 31 U/L (ref 15–41)
Albumin: 3 g/dL — ABNORMAL LOW (ref 3.5–5.0)
Alkaline Phosphatase: 86 U/L (ref 38–126)
Anion gap: 7 (ref 5–15)
BUN: 15 mg/dL (ref 6–20)
CO2: 23 mmol/L (ref 22–32)
Calcium: 8.6 mg/dL — ABNORMAL LOW (ref 8.9–10.3)
Chloride: 106 mmol/L (ref 101–111)
Creatinine, Ser: 1.03 mg/dL — ABNORMAL HIGH (ref 0.44–1.00)
GFR calc Af Amer: >60 mL/min (ref >60)
GFR calc non Af Amer: >60 mL/min (ref >60)
Glucose, Bld: 78 mg/dL (ref 65–99)
Potassium: 5.1 mmol/L (ref 3.5–5.1)
Sodium: 136 mmol/L (ref 135–145)
Total Bilirubin: 0.3 mg/dL (ref 0.3–1.2)
Total Protein: 6.3 g/dL — ABNORMAL LOW (ref 6.5–8.1)

## 2016-05-07 LAB — CBC
HCT: 17.5 % — ABNORMAL LOW (ref 36.0–46.0)
Hemoglobin: 6.1 g/dL — CL (ref 12.0–15.0)
MCH: 32.6 pg (ref 26.0–34.0)
MCHC: 34.9 g/dL (ref 30.0–36.0)
MCV: 93.6 fL (ref 78.0–100.0)
Platelets: 193 10*3/uL (ref 150–400)
RBC: 1.87 MIL/uL — ABNORMAL LOW (ref 3.87–5.11)
RDW: 13.7 % (ref 11.5–15.5)
WBC: 14.5 10*3/uL — ABNORMAL HIGH (ref 4.0–10.5)

## 2016-05-07 MED ORDER — AMLODIPINE BESYLATE 10 MG PO TABS: 10.0000 mg | ORAL_TABLET | Freq: Every day | ORAL | 1 refills | Status: DC

## 2016-05-07 MED ORDER — HYDROCHLOROTHIAZIDE 25 MG PO TABS: 25.0000 mg | ORAL_TABLET | Freq: Every day | ORAL | 3 refills | Status: DC

## 2016-05-07 MED ORDER — OXYCODONE-ACETAMINOPHEN 5-325 MG PO TABS: 1.0000 | ORAL_TABLET | ORAL | 0 refills | Status: DC | PRN

## 2016-05-07 MED ORDER — FERROUS SULFATE 325 (65 FE) MG PO TABS: 325.0000 mg | ORAL_TABLET | Freq: Two times a day (BID) | ORAL | 3 refills | Status: DC

## 2016-05-07 MED ORDER — SENNOSIDES-DOCUSATE SODIUM 8.6-50 MG PO TABS: 2.0000 | ORAL_TABLET | ORAL | 3 refills | Status: DC

## 2016-05-07 NOTE — Progress Notes (Signed)
Message left for Baby Love RN to see pt on Tues for a BP check.

## 2016-05-07 NOTE — Discharge Instructions (Signed)
Cesarean Delivery, Care After  Refer to this sheet in the next few weeks. These instructions provide you with information on caring for yourself after your procedure. Your health care provider may also give you specific instructions. Your treatment has been planned according to current medical practices, but problems sometimes occur. Call your health care provider if you have any problems or questions after you go home.  HOME CARE INSTRUCTIONS   Only take over-the-counter or prescription medications as directed by your health care provider.   Do not drink alcohol, especially if you are breastfeeding or taking medication to relieve pain.   Do not chew or smoke tobacco.   Continue to use good perineal care. Good perineal care includes:    Wiping your perineum from front to back.    Keeping your perineum clean.   Check your surgical cut (incision) daily for increased redness, drainage, swelling, or separation of skin.   Clean your incision gently with soap and water every day, and then pat it dry. If your health care provider says it is okay, leave the incision uncovered. Use a bandage (dressing) if the incision is draining fluid or appears irritated. If the adhesive strips across the incision do not fall off within 7 days, carefully peel them off.   Hug a pillow when coughing or sneezing until your incision is healed. This helps to relieve pain.   Do not use tampons or douche until your health care provider says it is okay.   Shower, wash your hair, and take tub baths as directed by your health care provider.   Wear a well-fitting bra that provides breast support.   Limit wearing support panties or control-top hose.   Drink enough fluids to keep your urine clear or pale yellow.   Eat high-fiber foods such as whole grain cereals and breads, brown rice, beans, and fresh fruits and vegetables every day. These foods may help prevent or relieve constipation.   Resume activities such as climbing stairs,  driving, lifting, exercising, or traveling as directed by your health care provider.   Talk to your health care provider about resuming sexual activities. This is dependent upon your risk of infection, your rate of healing, and your comfort and desire to resume sexual activity.   Try to have someone help you with your household activities and your newborn for at least a few days after you leave the hospital.   Rest as much as possible. Try to rest or take a nap when your newborn is sleeping.   Increase your activities gradually.   Keep all of your scheduled postpartum appointments. It is very important to keep your scheduled follow-up appointments. At these appointments, your health care provider will be checking to make sure that you are healing physically and emotionally.  SEEK MEDICAL CARE IF:    You are passing large clots from your vagina. Save any clots to show your health care provider.   You have a foul smelling discharge from your vagina.   You have trouble urinating.   You are urinating frequently.   You have pain when you urinate.   You have a change in your bowel movements.   You have increasing redness, pain, or swelling near your incision.   You have pus draining from your incision.   Your incision is separating.   You have painful, hard, or reddened breasts.   You have a severe headache.   You have blurred vision or see spots.   You feel sad   or depressed.   You have thoughts of hurting yourself or your newborn.   You have questions about your care, the care of your newborn, or medications.   You are dizzy or light-headed.   You have a rash.   You have pain, redness, or swelling at the site of the removed intravenous access (IV) tube.   You have nausea or vomiting.   You stopped breastfeeding and have not had a menstrual period within 12 weeks of stopping.   You are not breastfeeding and have not had a menstrual period within 12 weeks of delivery.   You have a fever.  SEEK  IMMEDIATE MEDICAL CARE IF:   You have persistent pain.   You have chest pain.   You have shortness of breath.   You faint.   You have leg pain.   You have stomach pain.   Your vaginal bleeding saturates 2 or more sanitary pads in 1 hour.  MAKE SURE YOU:    Understand these instructions.   Will watch your condition.   Will get help right away if you are not doing well or get worse.     This information is not intended to replace advice given to you by your health care provider. Make sure you discuss any questions you have with your health care provider.     Document Released: 06/10/2002 Document Revised: 10/09/2014 Document Reviewed: 05/15/2012  Elsevier Interactive Patient Education 2016 [employer]

## 2016-05-07 NOTE — Discharge Summary (Signed)
OB Discharge Summary     Patient Name: Patty Gilmore DOB: 04/16/1991 MRN: [id number]  Date of admission: 05/03/2016 Delivering MD: Tilda BurrowFERGUSON, JOHN V   Date of discharge: 05/07/2016  Admitting diagnosis: 29WKS, CHEST AND ABD PAIN Intrauterine pregnancy: 3634w3d     Secondary diagnosis:  Severe Preeclampsia Additional problems: elevated Creatinine. Elevated LFts, Thrombocytopenia     Discharge diagnosis: Preterm Pregnancy Delivered                                                                                                Post partum procedures:none  Augmentation: cytotec  Complications: None  Hospital course:  Sceduled C/S   25 y.o. yo G1P0101 at 4534w3d was admitted to the hospital 05/03/2016 for IOL for cHTn with super-imposed pre-eclamspia with progressed to pLTCS for fetal indications Membrane Rupture Time/Date: 2:23 AM ,05/04/2016   Patient delivered a Viable infant.05/04/2016  Details of operation can be found in separate operative note.  Pateint had an uncomplicated postpartum course.  She is ambulating, tolerating a regular diet, passing flatus, and urinating well. Patient is discharged home in stable condition on  05/07/16          Physical exam Vitals:   05/06/16 1005 05/06/16 1735 05/06/16 2105 05/07/16 0600  BP: (!) 144/101 133/84 (!) 141/91 134/77  Pulse: 81 94 90 88  Resp: 16 18 18 18   Temp:  98.2 F (36.8 C) 97.8 F (36.6 C) 98.3 F (36.8 C)  TempSrc:  Oral Oral Oral  SpO2:  100% 100% 100%  Weight:    210 lb 8 oz (95.5 kg)  Height:       General: alert, cooperative and no distress Lochia: appropriate Uterine Fundus: firm Incision: No significant erythema, Dressing is clean, dry, and intact DVT Evaluation: No evidence of DVT seen on physical exam. No significant calf/ankle edema. Labs: Lab Results  Component Value Date   WBC 14.5 (H) 05/07/2016   HGB 6.1 (LL) 05/07/2016   HCT 17.5 (L) 05/07/2016   MCV 93.6 05/07/2016   PLT 193 05/07/2016   CMP Latest  Ref Rng & Units 05/07/2016  Glucose 65 - 99 mg/dL 78  BUN 6 - 20 mg/dL 15  Creatinine 6.640.44 - 4.031.00 mg/dL 4.74(Q1.03(H)  Sodium 595135 - 638145 mmol/L 136  Potassium 3.5 - 5.1 mmol/L 5.1  Chloride 101 - 111 mmol/L 106  CO2 22 - 32 mmol/L 23  Calcium 8.9 - 10.3 mg/dL 7.5(I8.6(L)  Total Protein 6.5 - 8.1 g/dL 6.3(L)  Total Bilirubin 0.3 - 1.2 mg/dL 0.3  Alkaline Phos 38 - 126 U/L 86  AST 15 - 41 U/L 31  ALT 14 - 54 U/L 38    Discharge instruction: per After Visit Summary and "Baby and Me Booklet".  After visit meds:    Medication List    STOP taking these medications   aspirin EC 81 MG tablet   labetalol 200 MG tablet Commonly known as:  NORMODYNE     TAKE these medications   acetaminophen 325 MG tablet Commonly known as:  TYLENOL Take 650 mg by mouth every 6 (six) hours as  needed for headache. Reported on 01/13/2016   amLODipine 10 MG tablet Commonly known as:  NORVASC Take 1 tablet (10 mg total) by mouth daily. What changed:  when to take this   ferrous sulfate 325 (65 FE) MG tablet Take 1 tablet (325 mg total) by mouth 2 (two) times daily with a meal.   hydrochlorothiazide 25 MG tablet Commonly known as:  HYDRODIURIL Take 1 tablet (25 mg total) by mouth daily.   oxyCODONE-acetaminophen 5-325 MG tablet Commonly known as:  PERCOCET/ROXICET Take 1 tablet by mouth every 4 (four) hours as needed (pain scale 4-7).   prenatal multivitamin Tabs tablet Take 1 tablet by mouth daily at 12 noon.   senna-docusate 8.6-50 MG tablet Commonly known as:  Senokot-S Take 2 tablets by mouth daily.       Diet: routine diet  Activity: Advance as tolerated. Pelvic rest for 6 weeks.   Outpatient follow up:1 wk for Bp check then 6 wks post partum Follow up Appt:Future Appointments Date Time Provider Department Center  05/08/2016 10:30 AM [facility] [place] 1 [facility]   Follow up Visit:No Follow-up on file.  Postpartum contraception: Undecided  Newborn Data: Live born female  Birth Weight: 1  lb 14 oz (850 g) APGAR: 1, 6  Baby Feeding: Bottle Disposition:NICU   05/07/2016 Ernestina Penna, MD

## 2016-05-07 NOTE — Lactation Note (Signed)
This note was copied from a baby's chart. Lactation Consultation Note  Patient Name: Patty Gilmore [id number]'s Date: 05/07/2016 Reason for consult: Follow-up assessment;NICU baby;Infant < 6lbs   Follow up with first time mom of 8777 hour old infant. Mom reports she is pumping every 3 hours. She reports she is obtaining 15-20 cc/ breast each pumping. Advised her to change to maintenance setting and to pump for 15-20 minutes or for up to 2 minutes post milk stops flowing. Mom to have desk call LC if she is being d/c home today to rent a pump. Mom has paperwork at bedside. Follow up as needed.    Maternal Data Formula Feeding for Exclusion: No Has patient been taught Hand Expression?: Yes Does the patient have breastfeeding experience prior to this delivery?: No  Feeding Feeding Type: Breast Milk  LATCH Score/Interventions                      Lactation Tools Discussed/Used WIC Program: Yes Pump Review: Setup, frequency, and cleaning;Milk Storage Initiated by:: Reviewed   Consult Status Consult Status: PRN Follow-up type: Call as needed    Ed BlalockSharon S Artice Holohan 05/07/2016, 8:10 AM

## 2016-05-07 NOTE — Lactation Note (Signed)
This note was copied from a baby's chart. Lactation Consultation Note DEP WIC loaner loaned to mom. I decreased mom to 24 flanges with a more comfortable fit. I instructed mom in the use of DEP and settings. I advised mom to apply coconut oil to her nipples prior to pumping, and to bring her pump kit with her when she visits the baby in NICU. Mom knows to ask for lactation as needed, especially while visiting baby in NICU. STS time encouraged also.  Patient Name: Patty Gilmore [id number] Date: 05/07/2016 Reason for consult: Follow-up assessment   Maternal Data    Feeding    LATCH Score/Interventions                      Lactation Tools Discussed/Used     Consult Status Consult Status: PRN Follow-up type: In-patient (NICU)    Tonna Corner 05/07/2016, 1:08 PM

## 2016-05-08 ENCOUNTER — Ambulatory Visit (HOSPITAL_COMMUNITY): Admission: RE | Admit: 2016-05-08 | Payer: Medicaid Other | Source: Ambulatory Visit

## 2016-05-09 ENCOUNTER — Telehealth: Payer: Self-pay | Admitting: *Deleted

## 2016-05-09 NOTE — Telephone Encounter (Signed)
Kim RN with [facility] had home visit with patient this morning. When she arrived patient had just woken up and had not taken any meds. Her BP was 130/98 and 140/100. Pt denied any headaches or visual disturbances. Kim advised patient to take RX and recheck her bp later.

## 2016-05-16 ENCOUNTER — Encounter: Payer: Self-pay | Admitting: *Deleted

## 2016-06-14 ENCOUNTER — Ambulatory Visit (INDEPENDENT_AMBULATORY_CARE_PROVIDER_SITE_OTHER): Payer: Medicaid Other | Admitting: Obstetrics & Gynecology

## 2016-06-14 ENCOUNTER — Encounter: Payer: Self-pay | Admitting: Family Medicine

## 2016-06-14 ENCOUNTER — Encounter: Payer: Self-pay | Admitting: Obstetrics & Gynecology

## 2016-06-14 DIAGNOSIS — Z30017 Encounter for initial prescription of implantable subdermal contraceptive: Secondary | ICD-10-CM

## 2016-06-14 DIAGNOSIS — Z3202 Encounter for pregnancy test, result negative: Secondary | ICD-10-CM

## 2016-06-14 DIAGNOSIS — Z23 Encounter for immunization: Secondary | ICD-10-CM | POA: Diagnosis not present

## 2016-06-14 LAB — POCT PREGNANCY, URINE: Preg Test, Ur: NEGATIVE

## 2016-06-14 MED ORDER — ETONOGESTREL 68 MG ~~LOC~~ IMPL
68.0000 mg | DRUG_IMPLANT | Freq: Once | SUBCUTANEOUS | Status: AC
  Administered 2016-06-14: 68 mg via SUBCUTANEOUS

## 2016-06-14 NOTE — Progress Notes (Addendum)
Subjective:     Patty Gilmore is a 25 y.o. 551P0101 female who presents for a postpartum visit. She is 5 weeks postpartum following a low transverse Cesarean section. I have fully reviewed the prenatal and intrapartum course. The delivery was at 29.3 gestational weeks for severe preeclampsia. Outcome: primary cesarean section, low transverse incision. Anesthesia: general. Postpartum course has been unremarkable, on HCTZ and Norvasc for BP control.  Baby's course has been complicated by NICU admission for prematurity. Baby is feeding by both breast and bottle - Similac Neosure. Bleeding staining only. Bowel function is normal. Bladder function is normal. Patient is not sexually active. Contraception method is none. Patient is interested in the Nexplanon. Depression screen is negative. Desires flu vaccine.   The following portions of the patient's history were reviewed and updated as appropriate: allergies, current medications, past family history, past medical history, past social history, past surgical history and problem list.  [facility]   Review of Systems Pertinent items noted in HPI and remainder of comprehensive ROS otherwise negative.   Objective:    BP 119/75   Pulse 89   Ht 5\' 2"  (1.575 m)   Wt 206 lb (93.4 kg)   Breastfeeding? Yes   BMI 37.68 kg/m   General:  alert and no distress   Breasts:  inspection negative, no nipple discharge or bleeding, no masses or nodularity palpable  Lungs: clear to auscultation bilaterally  Heart:  regular rate and rhythm  Abdomen: soft, non-tender; bowel sounds normal; no masses,  no organomegaly. Incision  C/D/I.    Pelvic:  not evaluated        Nexplanon Insertion Procedure Patient identified, informed consent performed, consent signed.   Patient does understand that irregular bleeding is a very common side effect of this medication. She was advised to have backup contraception for one week after placement.  Pregnancy test in clinic today was negative.  Appropriate time out taken.  Patient's left arm was prepped and draped in the usual sterile fashion. The ruler used to measure and mark insertion area.  Patient was prepped with alcohol swab and then injected with 3 ml of 1% lidocaine.  She was prepped with betadine, Nexplanon removed from packaging,  Device confirmed in needle, then inserted full length of needle and withdrawn per handbook instructions. Nexplanon was able to palpated in the patient's arm; patient palpated the insert herself. There was minimal blood loss.  Patient insertion site covered with guaze and a pressure bandage to reduce any bruising.  The patient tolerated the procedure well and was given post procedure instructions.   Assessment:   Normal postpartum exam after cesarean section.   Nexplanon insertion  Plan:   1. Contraception: Nexplanon 2. Patient needs follow up pap in 11/2016 given previous abnormal pap smear.  3. [facility] and here as needed for any GYN concerns.   4 .Flu vaccine given today.   Jaynie CollinsUGONNA  Beuford Garcilazo, MD, FACOG Attending Obstetrician & Gynecologist, [facility], [facility]

## 2016-06-14 NOTE — Patient Instructions (Addendum)
[facility]  Return to clinic for any scheduled appointments or for any gynecologic concerns as needed.

## 2016-09-09 ENCOUNTER — Telehealth: Payer: Self-pay | Admitting: Radiology

## 2016-09-09 NOTE — Telephone Encounter (Signed)
Patient states she has Nexplanon and is also on oral BCP, she has had some increased bleeding. I have advised her we will be happy to see her for this.

## 2017-04-26 ENCOUNTER — Other Ambulatory Visit (HOSPITAL_COMMUNITY): Payer: Self-pay | Admitting: Obstetrics & Gynecology

## 2017-09-15 ENCOUNTER — Emergency Department (HOSPITAL_COMMUNITY): Payer: Self-pay

## 2017-09-15 ENCOUNTER — Emergency Department (HOSPITAL_COMMUNITY)
Admission: EM | Admit: 2017-09-15 | Discharge: 2017-09-15 | Disposition: A | Discharge status: Home / Self Care | Payer: Self-pay | Attending: Emergency Medicine | Admitting: Emergency Medicine

## 2017-09-15 ENCOUNTER — Encounter (HOSPITAL_COMMUNITY): Payer: Self-pay

## 2017-09-15 ENCOUNTER — Other Ambulatory Visit: Payer: Self-pay

## 2017-09-15 DIAGNOSIS — Y999 Unspecified external cause status: Secondary | ICD-10-CM | POA: Insufficient documentation

## 2017-09-15 DIAGNOSIS — Y939 Activity, unspecified: Secondary | ICD-10-CM | POA: Insufficient documentation

## 2017-09-15 DIAGNOSIS — Z9114 Patient's other noncompliance with medication regimen: Secondary | ICD-10-CM | POA: Insufficient documentation

## 2017-09-15 DIAGNOSIS — S43004A Unspecified dislocation of right shoulder joint, initial encounter: Secondary | ICD-10-CM | POA: Insufficient documentation

## 2017-09-15 DIAGNOSIS — I10 Essential (primary) hypertension: Secondary | ICD-10-CM | POA: Insufficient documentation

## 2017-09-15 DIAGNOSIS — W19XXXA Unspecified fall, initial encounter: Secondary | ICD-10-CM | POA: Insufficient documentation

## 2017-09-15 DIAGNOSIS — Z79899 Other long term (current) drug therapy: Secondary | ICD-10-CM | POA: Insufficient documentation

## 2017-09-15 DIAGNOSIS — Y929 Unspecified place or not applicable: Secondary | ICD-10-CM | POA: Insufficient documentation

## 2017-09-15 MED ORDER — PROPOFOL 10 MG/ML IV BOLUS
INTRAVENOUS | Status: DC | PRN
  Administered 2017-09-15 (×2): 50 mg via INTRAVENOUS

## 2017-09-15 MED ORDER — PROPOFOL 10 MG/ML IV BOLUS: 0.5000 mg/kg | INTRAVENOUS | Status: DC | PRN

## 2017-09-15 MED ORDER — MORPHINE SULFATE (PF) 4 MG/ML IV SOLN
4.0000 mg | Freq: Once | INTRAVENOUS | Status: AC
  Administered 2017-09-15: 4 mg via INTRAVENOUS
  Filled 2017-09-15: qty 1

## 2017-09-15 MED ORDER — AMLODIPINE BESYLATE 10 MG PO TABS: 10.0000 mg | ORAL_TABLET | Freq: Every day | ORAL | 1 refills | Status: DC

## 2017-09-15 MED ORDER — NAPROXEN 500 MG PO TABS: 500.0000 mg | ORAL_TABLET | Freq: Two times a day (BID) | ORAL | 0 refills | Status: DC

## 2017-09-15 MED ORDER — OXYCODONE-ACETAMINOPHEN 5-325 MG PO TABS
1.0000 | ORAL_TABLET | ORAL | Status: DC | PRN
  Administered 2017-09-15: 1 via ORAL
  Filled 2017-09-15: qty 1

## 2017-09-15 MED ORDER — PROPOFOL 10 MG/ML IV BOLUS
INTRAVENOUS | Status: AC
  Filled 2017-09-15: qty 20

## 2017-09-15 MED ORDER — METHOCARBAMOL 500 MG PO TABS: 500.0000 mg | ORAL_TABLET | Freq: Every evening | ORAL | 0 refills | Status: DC | PRN

## 2017-09-15 NOTE — ED Triage Notes (Signed)
Onset last night pt fell on right shoulder.  Painful from right tricep to shoulder.  Unable to lift right arm up.

## 2017-09-15 NOTE — Sedation Documentation (Signed)
Shoulder reduction performed, port xray at bedside

## 2017-09-15 NOTE — Progress Notes (Signed)
Orthopedic Tech Progress Note Patient Details:  Loyal BubaDelphelia V Hetz 04/14/1991 [id number]  Ortho Devices Type of Ortho Device: Sling immobilizer Ortho Device/Splint Interventions: Application   Post Interventions Patient Tolerated: Well Instructions Provided: Care of device   Saul FordyceJennifer C Kiamesha Samet 09/15/2017, 4:14 PM

## 2017-09-15 NOTE — ED Provider Notes (Signed)
.  Sedation Date/Time: 09/15/2017 3:24 PM Performed by: Linwood DibblesKnapp, Abhimanyu Cruces, MD Authorized by: Linwood DibblesKnapp, Doria Fern, MD   Consent:    Consent obtained:  Verbal   Consent given by:  Patient   Risks discussed:  Allergic reaction, dysrhythmia, inadequate sedation, nausea, prolonged hypoxia resulting in organ damage, prolonged sedation necessitating reversal, respiratory compromise necessitating ventilatory assistance and intubation and vomiting   Alternatives discussed:  Analgesia without sedation, anxiolysis and regional anesthesia Universal protocol:    Procedure explained and questions answered to patient or proxy's satisfaction: yes     Relevant documents present and verified: yes     Test results available and properly labeled: yes     Imaging studies available: yes     Required blood products, implants, devices, and special equipment available: yes     Site/side marked: yes     Immediately prior to procedure a time out was called: yes     Patient identity confirmation method:  Verbally with patient Indications:    Procedure necessitating sedation performed by:  Physician performing sedation   Intended level of sedation:  Deep Pre-sedation assessment:    ASA classification: class 1 - normal, healthy patient     Neck mobility: normal     Mouth opening:  3 or more finger widths   Thyromental distance:  4 finger widths   Mallampati score:  I - soft palate, uvula, fauces, pillars visible   Pre-sedation assessments completed and reviewed: airway patency, cardiovascular function, hydration status, mental status, nausea/vomiting, pain level, respiratory function and temperature   Immediate pre-procedure details:    Reassessment: Patient reassessed immediately prior to procedure     Reviewed: vital signs, relevant labs/tests and NPO status     Verified: bag valve mask available, emergency equipment available, intubation equipment available, IV patency confirmed, oxygen available and suction available    Procedure details (see MAR for exact dosages):    Preoxygenation:  Nasal cannula   Sedation:  Propofol   Intra-procedure monitoring:  Blood pressure monitoring, cardiac monitor, continuous pulse oximetry, frequent LOC assessments, frequent vital sign checks and continuous capnometry   Intra-procedure events: none   Post-procedure details:    Attendance: Constant attendance by certified staff until patient recovered     Recovery: Patient returned to pre-procedure baseline     Post-sedation assessments completed and reviewed: airway patency, cardiovascular function, hydration status, mental status, nausea/vomiting, pain level, respiratory function and temperature     Patient is stable for discharge or admission: yes     Patient tolerance:  Tolerated well, no immediate complications   Medical screening examination/treatment/procedure(s) were conducted as a shared visit with non-physician practitioner(s) and myself.  I personally evaluated the patient during the encounter.     Linwood DibblesKnapp, Deniya Craigo, MD 09/15/17 [phone]
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
## 2017-09-15 NOTE — ED Notes (Signed)
 100mg  propofol wasted in sink with Chrisandra Nettershris C RN

## 2017-09-15 NOTE — Discharge Instructions (Signed)
As discussed, make sure you follow-up with your primary care provider and remain on your blood pressure medications.  Use a shoulder immobilizer for comfort, muscle relaxant at nighttime as needed and naproxen twice a day with food.  Do not drive or operate machinery while taking a muscle relaxant.  Follow-up with orthopedics. Return sooner if you experience worsening symptoms, numbness, pale cool extremity or other new concerning symptoms in the meantime.

## 2017-11-27 IMAGING — US US MFM OB FOLLOW-UP
2 series · 14 of 28 positions shown · non-contrast
Comparison: none

[Series 1: us mfm ob follow-up · 10 of 37 slices shown (1 of 2)]
[im 2/37]
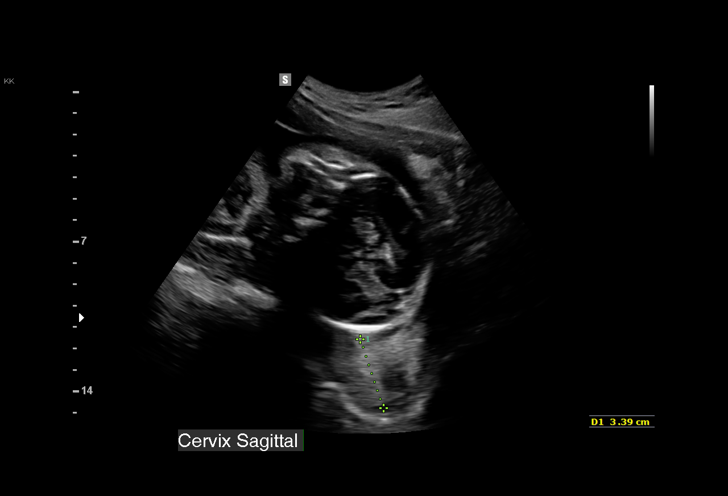
[im 6/37]
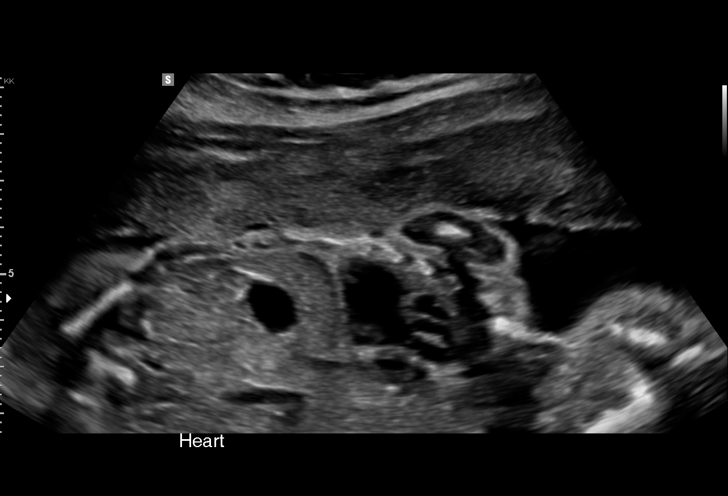
[im 10/37]
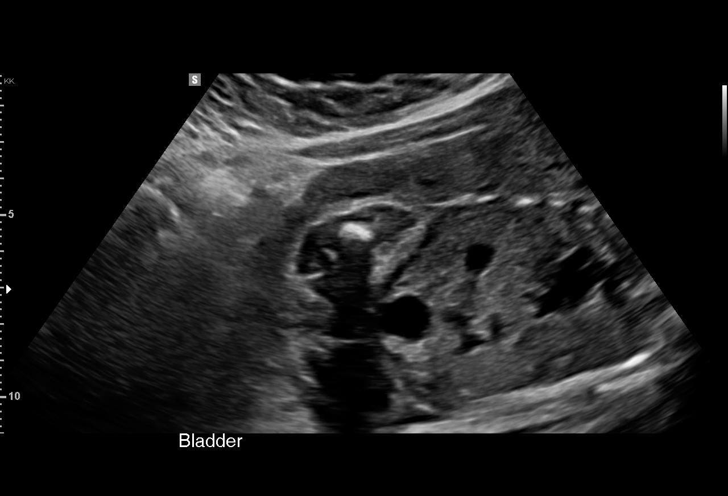
[im 13/37]
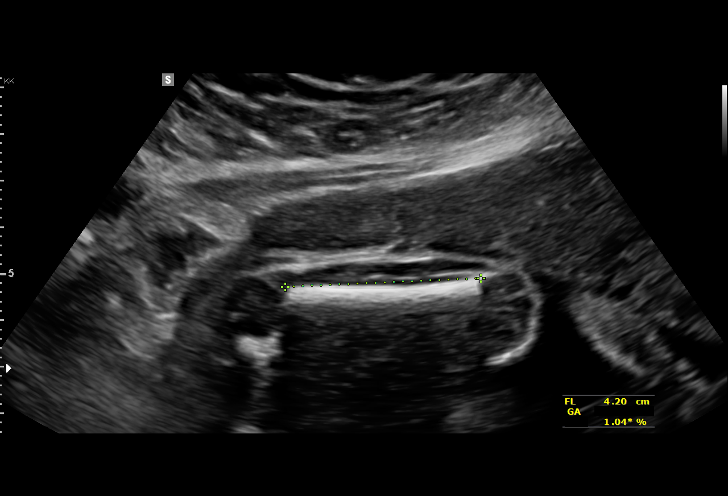
[im 17/37]
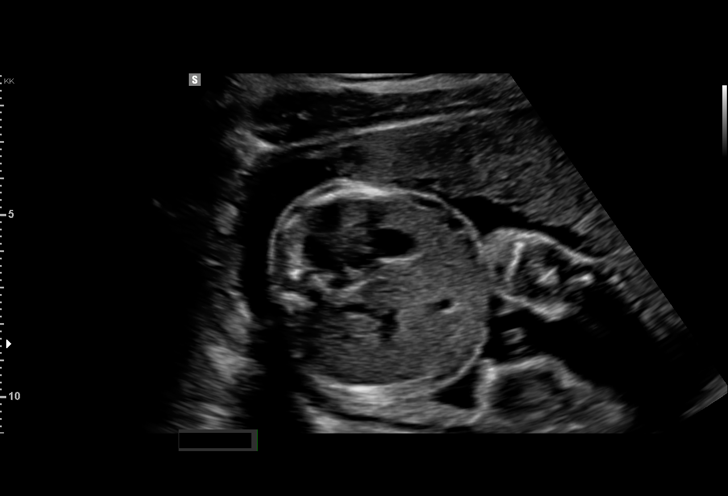
[im 20/37]
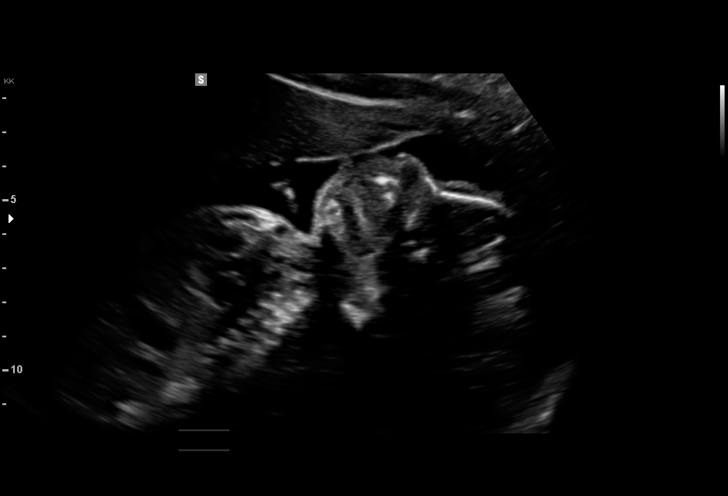
[im 24/37]
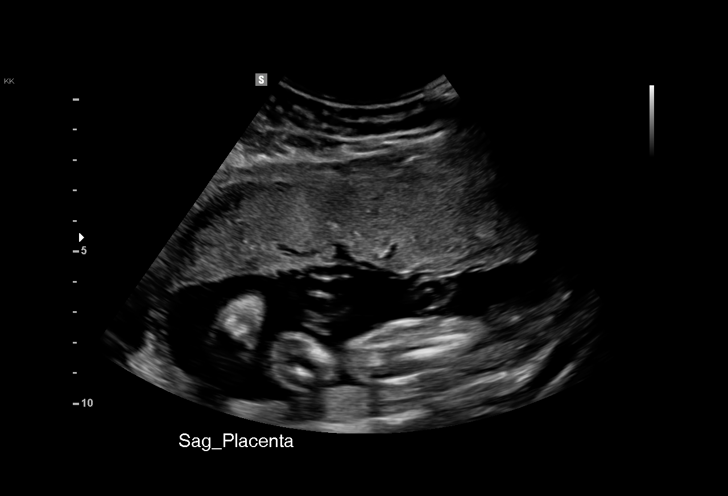
[im 28/37]
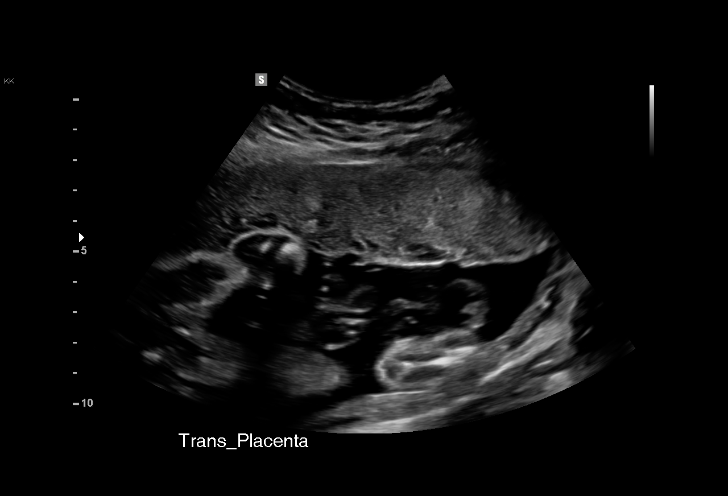
[im 31/37]
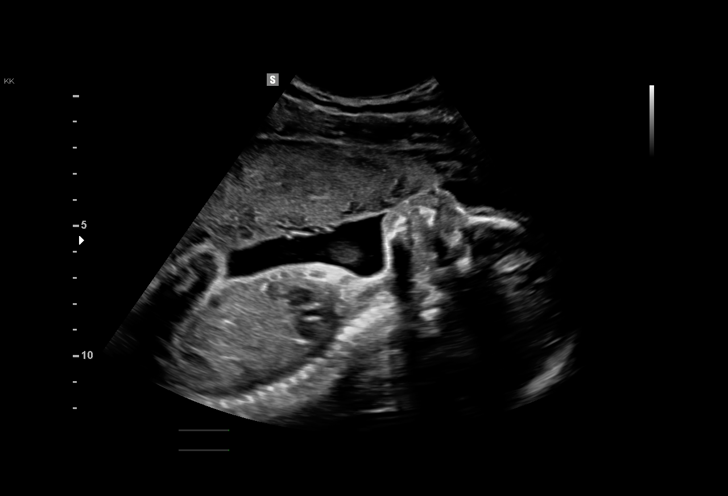
[im 35/37]
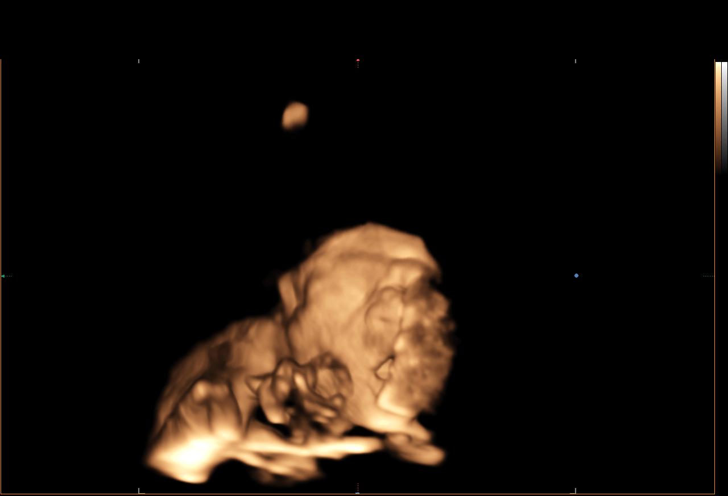

[Series 3: us mfm ob follow-up · 4 of 13 slices shown (2 of 2)]
[im 1/13]
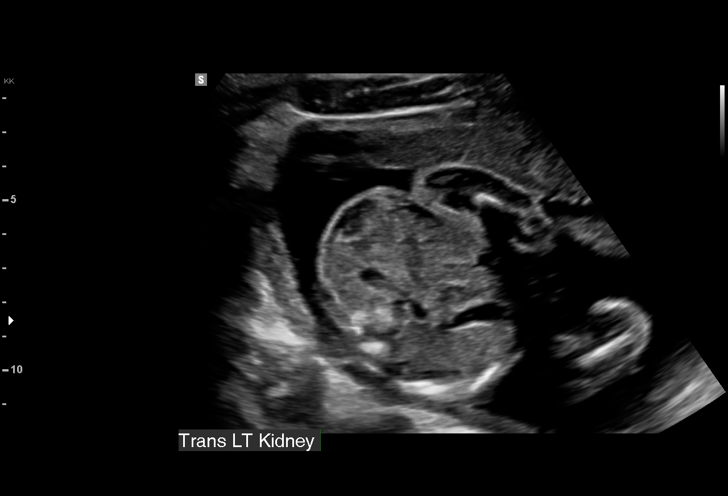
[im 5/13]
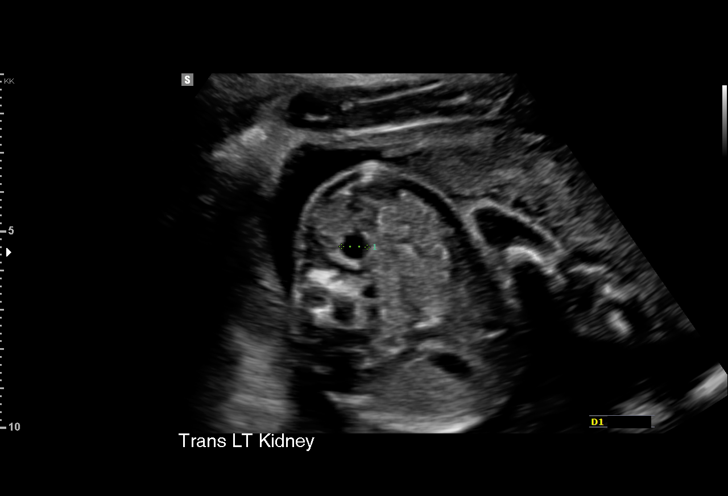
[im 9/13]
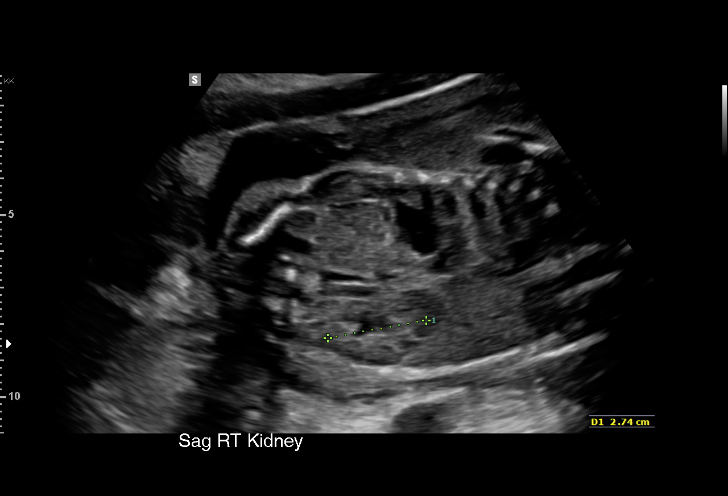
[im 13/13]
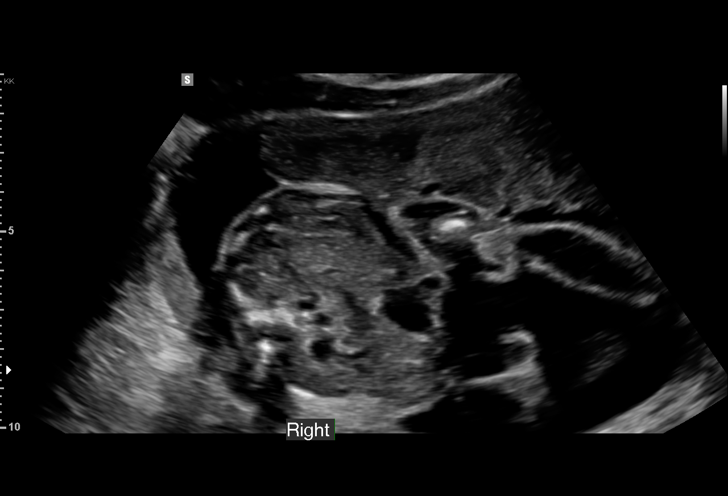

[14 of 28 positions shown; findings below may reference images not displayed]

OB/Gyn Clinic
[REDACTED]-
Faculty Physician

Indications

26 weeks gestation of pregnancy
Hypertension - Chronic/Pre-existing
(labetalol)
Medical complication of pregnancy (History
of maternal heart condition)
Obesity complicating pregnancy, second
trimester
OB History

Blood Type:            Height:  5'2"   Weight (lb):  212      BMI:
Gravidity:    1         Term:   0        Prem:   0        SAB:   0
TOP:          0       Ectopic:  0        Living: 0
Fetal Evaluation

Num Of Fetuses:     1
Fetal Heart         135
Rate(bpm):
Cardiac Activity:   Observed
Presentation:       Cephalic
Placenta:           Anterior, above cervical os
P. Cord Insertion:  Previously Visualized

Amniotic Fluid
AFI FV:      Subjectively within normal limits

Largest Pocket(cm)
4.3
Biometry

BPD:      66.5  mm     G. Age:  26w 6d         68  %    CI:        75.44   %   70 - 86
FL/HC:      17.4   %   18.6 -
HC:      242.8  mm     G. Age:  26w 3d         38  %    HC/AC:      1.18       1.04 -
AC:       206   mm     G. Age:  25w 1d         19  %    FL/BPD:     63.5   %   71 - 87
FL:       42.2  mm     G. Age:  23w 5d        < 3  %    FL/AC:      20.5   %   20 - 24

Est. FW:     743  gm    1 lb 10 oz      28  %
Gestational Age

LMP:           26w 0d       Date:   10/11/15                 EDD:   07/17/16
U/S Today:     25w 4d                                        EDD:   07/20/16
Best:          26w 0d    Det. By:   LMP  (10/11/15)          EDD:   07/17/16
Anatomy

Cranium:               Appears normal         Aortic Arch:            Previously seen
Cavum:                 Previously seen        Ductal Arch:            Previously seen
Ventricles:            Previously seen        Diaphragm:              Appears normal
Choroid Plexus:        Previously seen        Stomach:                Appears normal, left
sided
Cerebellum:            Previously seen        Abdomen:                Appears normal
Posterior Fossa:       Previously seen        Abdominal Wall:         Previously seen
Nuchal Fold:           Previously seen        Cord Vessels:           Previously seen
Face:                  Orbits and profile     Kidneys:                Bilateral UTD
previously seen
Lips:                  Previously seen        Bladder:                Appears normal
Thoracic:              Appears normal         Spine:                  Previously seen
Heart:                 Appears normal         Upper Extremities:      Previously seen
(4CH, axis, and
situs)
RVOT:                  Appears normal         Lower Extremities:      Previously seen
LVOT:                  Appears normal

Other:  Female gender previously seen. Heels and 5th digit previously seen.
Nasal bone perviously visualized. Technically difficult due to fetal
position.
Cervix Uterus Adnexa

Cervix
Length:            3.4  cm.
Normal appearance by transabdominal scan.
Impression

Single IUP at 26w 0d
Chronic hypertension
Mild, bilateral urinary tract dilation is noted.
The left renal pelvis measures 6.5 mm; the right renal pelvis
measures 4.2 mm.  No calyceal dilation is noted
The estimated fetal weight is at the 28th %tile.
Anterior placenta without previa
Normal amniotic fluid volume

BPs: 174/97, repeat 177/99
Recommendations

Ms. July July was sent to [facility] for BP checks and preeclampsia
labs.
Recommend follow up ultrasound in 4 weeks for interval
growth

## 2017-11-30 ENCOUNTER — Encounter (HOSPITAL_COMMUNITY): Payer: Self-pay

## 2017-11-30 ENCOUNTER — Emergency Department (HOSPITAL_COMMUNITY)
Admission: EM | Admit: 2017-11-30 | Discharge: 2017-11-30 | Disposition: A | Discharge status: Home / Self Care | Payer: Self-pay | Attending: Emergency Medicine | Admitting: Emergency Medicine

## 2017-11-30 ENCOUNTER — Ambulatory Visit: Payer: Self-pay | Admitting: *Deleted

## 2017-11-30 ENCOUNTER — Other Ambulatory Visit: Payer: Self-pay

## 2017-11-30 ENCOUNTER — Emergency Department (HOSPITAL_COMMUNITY): Payer: Self-pay

## 2017-11-30 DIAGNOSIS — Z79899 Other long term (current) drug therapy: Secondary | ICD-10-CM | POA: Insufficient documentation

## 2017-11-30 DIAGNOSIS — B9689 Other specified bacterial agents as the cause of diseases classified elsewhere: Secondary | ICD-10-CM | POA: Insufficient documentation

## 2017-11-30 DIAGNOSIS — K8010 Calculus of gallbladder with chronic cholecystitis without obstruction: Secondary | ICD-10-CM | POA: Insufficient documentation

## 2017-11-30 DIAGNOSIS — I10 Essential (primary) hypertension: Secondary | ICD-10-CM | POA: Insufficient documentation

## 2017-11-30 DIAGNOSIS — R109 Unspecified abdominal pain: Secondary | ICD-10-CM

## 2017-11-30 DIAGNOSIS — N760 Acute vaginitis: Secondary | ICD-10-CM | POA: Insufficient documentation

## 2017-11-30 LAB — URINALYSIS, ROUTINE W REFLEX MICROSCOPIC
Bilirubin Urine: NEGATIVE
Glucose, UA: NEGATIVE mg/dL
Ketones, ur: NEGATIVE mg/dL
Nitrite: NEGATIVE
Protein, ur: 100 mg/dL — AB
Specific Gravity, Urine: 1.009 (ref 1.005–1.030)
pH: 7 (ref 5.0–8.0)

## 2017-11-30 LAB — WET PREP, GENITAL
Sperm: NONE SEEN
Trich, Wet Prep: NONE SEEN
Yeast Wet Prep HPF POC: NONE SEEN

## 2017-11-30 LAB — POC URINE PREG, ED: Preg Test, Ur: NEGATIVE

## 2017-11-30 MED ORDER — METRONIDAZOLE 500 MG PO TABS: 500.0000 mg | ORAL_TABLET | Freq: Two times a day (BID) | ORAL | 0 refills | Status: DC

## 2017-11-30 MED ORDER — ACETAMINOPHEN 500 MG PO TABS
1000.0000 mg | ORAL_TABLET | Freq: Once | ORAL | Status: AC
  Administered 2017-11-30: 1000 mg via ORAL
  Filled 2017-11-30: qty 2

## 2017-11-30 MED ORDER — KETOROLAC TROMETHAMINE 60 MG/2ML IM SOLN
15.0000 mg | Freq: Once | INTRAMUSCULAR | Status: AC
  Administered 2017-11-30: 15 mg via INTRAMUSCULAR
  Filled 2017-11-30: qty 2

## 2017-11-30 NOTE — ED Triage Notes (Signed)
Pt presents to the ed with complaints of right sided flank pain and a headache x 1 day. Denies any burning with urination.

## 2017-11-30 NOTE — ED Provider Notes (Signed)
[facility] EMERGENCY DEPARTMENT Provider Note   CSN: [id number] Arrival date & time: 11/30/17  1129     History   Chief Complaint Chief Complaint  Patient presents with  . Flank Pain    HPI Patty Gilmore is a 27 y.o. female.  27 yo F with a cc of right-sided abdominal pain.  This started this morning.  Pain was pretty severe initially and now has gone away.  Described as a sharp pain.  Felt that it wraps around to her back.  Had pain like this previously when she had a urinary tract infection.  She denies fevers or chills denies nausea or vomiting had some vaginal discharge that was foul-smelling but has improved over the course of the week.  Denies vaginal bleeding denies chance of being pregnant.  Denies trauma.  Unsure what makes this better or worse.   The history is provided by the patient.  Illness  This is a new problem. The current episode started less than 1 hour ago. The problem occurs constantly. The problem has not changed since onset.Pertinent negatives include no chest pain, no headaches and no shortness of breath. Nothing aggravates the symptoms. Nothing relieves the symptoms. She has tried nothing for the symptoms. The treatment provided no relief.    Past Medical History:  Diagnosis Date  . 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 Hypertension    was taking amlodipine stopped about 1 yr ago - no insurance - had been on meds since age 33  . Pregnancy induced hypertension     Patient Active Problem List   Diagnosis Date Noted  . Severe preeclampsia 05/03/2016  . 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 Pseudotumor cerebri 07/31/2014  . Acanthosis nigricans 01/16/2012  . LVH (left ventricular hypertrophy) 01/16/2012    Past Surgical History:  Procedure Laterality Date  . CESAREAN SECTION N/A 05/04/2016   Procedure: CESAREAN SECTION;  Surgeon: Tilda Burrow, MD;  Location: [facility] BIRTHING  SUITES;  Service: Obstetrics;  Laterality: N/A;  . LUMBAR PUNCTURE  07/20/2014   fluid build up around spinal column  . NO PAST SURGERIES      OB History    Gravida Para Term Preterm AB Living   1 1   1   1    SAB TAB Ectopic Multiple Live Births         0 1       Home Medications    Prior to Admission medications   Medication Sig Start Date End Date Taking? Authorizing Provider  amLODipine (NORVASC) 10 MG tablet Take 1 tablet (10 mg total) by mouth daily. 09/15/17   Mathews Robinsons B, PA-C  ferrous sulfate 325 (65 FE) MG tablet Take 1 tablet (325 mg total) by mouth 2 (two) times daily with a meal. 05/07/16   Lorne Skeens, MD  hydrochlorothiazide (HYDRODIURIL) 25 MG tablet Take 1 tablet (25 mg total) by mouth daily. 05/07/16   Lorne Skeens, MD  methocarbamol (ROBAXIN) 500 MG tablet Take 1 tablet (500 mg total) by mouth at bedtime as needed for muscle spasms. 09/15/17   Georgiana Shore, PA-C  metroNIDAZOLE (FLAGYL) 500 MG tablet Take 1 tablet (500 mg total) by mouth 2 (two) times daily. 11/30/17   Melene Plan, DO  naproxen (NAPROSYN) 500 MG tablet Take 1 tablet (500 mg total) by mouth 2 (two) times daily with a meal. 09/15/17   Georgiana Shore,  PA-C  oxyCODONE-acetaminophen (PERCOCET/ROXICET) 5-325 MG tablet Take 1 tablet by mouth every 4 (four) hours as needed (pain scale 4-7). 05/07/16   Lorne SkeensSchenk, Nicholas Michael, MD    Family History Family History  Problem Relation Age of Onset  . Diabetes Maternal Grandmother   . Diabetes Paternal Grandmother   . Thyroid disease Mother     Social History Social History   Tobacco Use  . Smoking status: Never Smoker  . Smokeless tobacco: Never Used  Substance Use Topics  . Alcohol use: No    Comment: Rarely (less than once per month)  . Drug use: Yes    Types: Marijuana     Allergies   Patient has no known allergies.   Review of Systems Review of Systems  Constitutional: Negative for chills and fever.    HENT: Negative for congestion and rhinorrhea.   Eyes: Negative for redness and visual disturbance.  Respiratory: Negative for shortness of breath and wheezing.   Cardiovascular: Negative for chest pain and palpitations.  Gastrointestinal: Negative for nausea and vomiting.  Genitourinary: Positive for flank pain. Negative for dysuria and urgency.  Musculoskeletal: Negative for arthralgias and myalgias.  Skin: Negative for pallor and wound.  Neurological: Negative for dizziness and headaches.     Physical Exam Updated Vital Signs BP (!) 144/87   Pulse 78   Temp 97.6 F (36.4 C)   Resp 16   Wt 97.5 kg (215 lb)   SpO2 100%   BMI 39.32 kg/m   Physical Exam  Constitutional: She is oriented to person, place, and time. She appears well-developed and well-nourished. No distress.  HENT:  Head: Normocephalic and atraumatic.  Eyes: EOM are normal. Pupils are equal, round, and reactive to light.  Neck: Normal range of motion. Neck supple.  Cardiovascular: Normal rate and regular rhythm. Exam reveals no gallop and no friction rub.  No murmur heard. Pulmonary/Chest: Effort normal. She has no wheezes. She has no rales.  Abdominal: Soft. She exhibits no distension and no mass. There is no tenderness. There is no guarding.  Musculoskeletal: She exhibits no edema or tenderness.  Neurological: She is alert and oriented to person, place, and time.  Skin: Skin is warm and dry. She is not diaphoretic.  Psychiatric: She has a normal mood and affect. Her behavior is normal.  Nursing note and vitals reviewed.    ED Treatments / Results  Labs (all labs ordered are listed, but only abnormal results are displayed) Labs Reviewed  WET PREP, GENITAL - Abnormal; Notable for the following components:      Result Value   Clue Cells Wet Prep HPF POC PRESENT (*)    WBC, Wet Prep HPF POC FEW (*)    All other components within normal limits  URINALYSIS, ROUTINE W REFLEX MICROSCOPIC - Abnormal; Notable  for the following components:   Color, Urine STRAW (*)    Hgb urine dipstick MODERATE (*)    Protein, ur 100 (*)    Leukocytes, UA SMALL (*)    Bacteria, UA RARE (*)    Squamous Epithelial / LPF 0-5 (*)    All other components within normal limits  HIV ANTIBODY (ROUTINE TESTING)  POC URINE PREG, ED  GC/CHLAMYDIA PROBE AMP ([place]) NOT AT [facility]    EKG  EKG Interpretation None       Radiology Ct Renal Stone Study  Result Date: 11/30/2017 CLINICAL DATA:  Right flank pain EXAM: CT ABDOMEN AND PELVIS WITHOUT CONTRAST TECHNIQUE: Multidetector CT imaging of the  abdomen and pelvis was performed following the standard protocol without IV contrast. COMPARISON:  None. FINDINGS: Lower chest: Lung bases are clear. Hepatobiliary: Unenhanced liver is unremarkable. Layering tiny gallstones (series 3/image 34), without associated inflammatory changes. Pancreas: Within normal limits. Spleen: Within normal limits. Adrenals/Urinary Tract: Adrenal glands are within normal limits. Kidneys are within normal limits. No renal, ureteral, or bladder calculi. No hydronephrosis. Bladder is within normal limits. Stomach/Bowel: Stomach is within normal limits. No evidence of bowel obstruction. Normal appendix (series 3/image 65). Vascular/Lymphatic: No evidence of abdominal aortic aneurysm. No suspicious abdominopelvic lymphadenopathy. Reproductive: Uterus is within normal limits. Bilateral ovaries are unremarkable. Other: No abdominopelvic ascites. Musculoskeletal: Visualized osseous structures are within normal limits. IMPRESSION: No renal, ureteral, or bladder calculi.  No hydronephrosis. Mild cholelithiasis, without associated inflammatory changes. Electronically Signed   By: Charline Bills M.D.   On: 11/30/2017 18:43    Procedures Procedures (including critical care time)  Medications Ordered in ED Medications  ketorolac (TORADOL) injection 15 mg (15 mg Intramuscular Given 11/30/17 1901)  acetaminophen  (TYLENOL) tablet 1,000 mg (1,000 mg Oral Given 11/30/17 1900)     Initial Impression / Assessment and Plan / ED Course  I have reviewed the triage vital signs and the nursing notes.  Pertinent labs & imaging results that were available during my care of the patient were reviewed by me and considered in my medical decision making (see chart for details).     27 yo F with a chief complaint of right-sided abdominal pain.  Benign abdominal exam.  She has blood in her urine, possible nephrolithiasis to the patient is very well-appearing.  Will obtain a pelvic exam as the patient had multiple other findings in her urine though no overt infection.  Pelvic without tenderness.  CT stone study with cholelithiasis without cholecystitis.  Repeat abdominal exam without abdominal pain.  Negative Murphy sign.  The patient is well-appearing nontoxic.  Able to tolerate p.o.  We will treat for BV.  Discharge home.  11:37 PM:  I have discussed the diagnosis/risks/treatment options with the patient and family and believe the pt to be eligible for discharge home to follow-up with PCP. We also discussed returning to the ED immediately if new or worsening sx occur. We discussed the sx which are most concerning (e.g., sudden worsening pain, fever, inability to tolerate by mouth) that necessitate immediate return. Medications administered to the patient during their visit and any new prescriptions provided to the patient are listed below.  Medications given during this visit Medications  ketorolac (TORADOL) injection 15 mg (15 mg Intramuscular Given 11/30/17 1901)  acetaminophen (TYLENOL) tablet 1,000 mg (1,000 mg Oral Given 11/30/17 1900)     The patient appears reasonably screen and/or stabilized for discharge and I doubt any other medical condition or other [facility] requiring further screening, evaluation, or treatment in the ED at this time prior to discharge.    Final Clinical Impressions(s) / ED Diagnoses   Final  diagnoses:  Right flank pain  Gallbladder stone with nonacute cholecystitis  BV (bacterial vaginosis)    ED Discharge Orders        Ordered    metroNIDAZOLE (FLAGYL) 500 MG tablet  2 times daily     11/30/17 1936       Melene Plan, DO 11/30/17 2337

## 2017-11-30 NOTE — Telephone Encounter (Signed)
Called in c/o right lower severe abd pain that started suddenly this morning and has spread around to her right side and back.  She said it even hurts to walk around when she puts weight on her right leg.  She denies fever, chills or any urinary symptoms.   Denies N/V/D with the abd pain.   She has had a kidney infection before but it did not feel anything like this when asked if she had experienced this before.  I have referred her to the ED.   She is going to have someone take her to [facility] now.   She has not been seen by a doctor since seeing Dr. Merla Richesoolittle a few years ago per pt. So I have not routed a note to a PCP as Dr. Merla Richesoolittle is no longer with the practice he was with.    Reason for Disposition . [1] SEVERE abdominal pain AND [2] present > 1 hour  Answer Assessment - Initial Assessment Questions 1. ONSET: "When did the pain begin?"  I feel pain when I put pressure on my right let.     It just started this morning about 2 hours ago.     2. LOCATION: "Where does it hurt?" (upper, mid or lower back)     *I feel the pain in my lower right abdomin and it has spread into my right side. 3. SEVERITY: "How bad is the pain?"  (e.g., Scale 1-10; mild, moderate, or severe)   - MILD (1-3): doesn't interfere with normal activities    - MODERATE (4-7): interferes with normal activities or awakens from sleep    - SEVERE (8-10): excruciating pain, unable to do any normal activities      5 on pain scale.     Sitting or standing does not make the pain worse. 4. PATTERN: "Is the pain constant?" (e.g., yes, no; constant, intermittent)      Constant.  Denies urinary symptoms.  Denies diarrhea N/V 5. RADIATION: "Does the pain shoot into your legs or elsewhere?"     I can feel the pain in my right side when I walk on my right leg.  My lower back and hip is where most of the pain is.    6. CAUSE:  "What do you think is causing the back pain?"      I don't know.    I;ve had a lumbar puncture before.  I've also  had a C section in the past.    This is a different pain that came from no where. 7. BACK OVERUSE:  "Any recent lifting of heavy objects, strenuous work or exercise?"     No 8. MEDICATIONS: "What have you taken so far for the pain?" (e.g., nothing, acetaminophen, NSAIDS)     No    Only took my BP medication. 9. NEUROLOGIC SYMPTOMS: "Do you have any weakness, numbness, or problems with bowel/bladder control?"     No 10. OTHER SYMPTOMS: "Do you have any other symptoms?" (e.g., fever, abdominal pain, burning with urination, blood in urine)       No 11. PREGNANCY: "Is there any chance you are pregnant?" (e.g., yes, no; LMP)       No.   I just had it a day or two ago.  Protocols used: BACK PAIN-A-AH

## 2017-11-30 NOTE — Discharge Instructions (Signed)
Take 4 over the counter ibuprofen tablets 3 times a day or 2 over-the-counter naproxen tablets twice a day for pain. Also take tylenol 1000mg(2 extra strength) four times a day.    

## 2017-12-01 LAB — HIV ANTIBODY (ROUTINE TESTING W REFLEX): HIV Screen 4th Generation wRfx: NONREACTIVE

## 2017-12-03 LAB — GC/CHLAMYDIA PROBE AMP (~~LOC~~) NOT AT ARMC
Chlamydia: NEGATIVE
Neisseria Gonorrhea: NEGATIVE

## 2018-01-29 ENCOUNTER — Encounter: Payer: Self-pay | Admitting: Emergency Medicine

## 2018-01-29 ENCOUNTER — Ambulatory Visit (INDEPENDENT_AMBULATORY_CARE_PROVIDER_SITE_OTHER): Payer: BLUE CROSS/BLUE SHIELD | Admitting: Emergency Medicine

## 2018-01-29 VITALS — BP 132/92 | HR 88 | Temp 98.4°F | Resp 17 | Ht 62.0 in | Wt 210.0 lb

## 2018-01-29 DIAGNOSIS — R1030 Lower abdominal pain, unspecified: Secondary | ICD-10-CM | POA: Diagnosis not present

## 2018-01-29 LAB — POCT CBC
Granulocyte percent: 43.7 %G (ref 37–80)
HCT, POC: 31.3 % — AB (ref 37.7–47.9)
Hemoglobin: 10.3 g/dL — AB (ref 12.2–16.2)
Lymph, poc: 3.1 (ref 0.6–3.4)
MCH, POC: 30.4 pg (ref 27–31.2)
MCHC: 32.8 g/dL (ref 31.8–35.4)
MCV: 92.6 fL (ref 80–97)
MID (cbc): 0.3 (ref 0–0.9)
MPV: 5.9 fL (ref 0–99.8)
POC Granulocyte: 2.6 (ref 2–6.9)
POC LYMPH PERCENT: 51.5 %L — AB (ref 10–50)
POC MID %: 4.8 %M (ref 0–12)
Platelet Count, POC: 351 10*3/uL (ref 142–424)
RBC: 3.38 M/uL — AB (ref 4.04–5.48)
RDW, POC: 13.7 %
WBC: 6 10*3/uL (ref 4.6–10.2)

## 2018-01-29 LAB — POCT URINALYSIS DIP (MANUAL ENTRY)
Bilirubin, UA: NEGATIVE
Blood, UA: NEGATIVE
Glucose, UA: NEGATIVE mg/dL
Ketones, POC UA: NEGATIVE mg/dL
Leukocytes, UA: NEGATIVE
Nitrite, UA: NEGATIVE
Protein Ur, POC: NEGATIVE mg/dL
Spec Grav, UA: 1.015 (ref 1.010–1.025)
Urobilinogen, UA: 0.2 E.U./dL
pH, UA: 5.5 (ref 5.0–8.0)

## 2018-01-29 LAB — POCT URINE PREGNANCY: Preg Test, Ur: NEGATIVE

## 2018-01-29 MED ORDER — DICYCLOMINE HCL 20 MG PO TABS: 20.0000 mg | ORAL_TABLET | Freq: Four times a day (QID) | ORAL | 1 refills | Status: DC

## 2018-01-29 NOTE — Progress Notes (Signed)
Patty Gilmore 26 y.o.   Chief Complaint  Patient presents with  . Abdominal Pain    HISTORY OF PRESENT ILLNESS: This is a 27 y.o. female complaining of lower abdominal pain for the past 2 to 3 days.  Today better than yesterday.  Still eating and drinking okay with nauseous but no vomiting.  No diarrhea.  No rectal bleeding.  No urinary symptoms.  Denies dysuria or hematuria.  No fever or chills.  Denies pregnancy.  No other significant symptoms.  HPI   Prior to Admission medications   Medication Sig Start Date End Date Taking? Authorizing Provider  amLODipine (NORVASC) 10 MG tablet Take 1 tablet (10 mg total) by mouth daily. 09/15/17  Yes Mathews Robinsons B, PA-C  ferrous sulfate 325 (65 FE) MG tablet Take 1 tablet (325 mg total) by mouth 2 (two) times daily with a meal. 05/07/16  Yes Lorne Skeens, MD  hydrochlorothiazide (HYDRODIURIL) 25 MG tablet Take 1 tablet (25 mg total) by mouth daily. 05/07/16  Yes Lorne Skeens, MD  methocarbamol (ROBAXIN) 500 MG tablet Take 1 tablet (500 mg total) by mouth at bedtime as needed for muscle spasms. 09/15/17  Yes Mathews Robinsons B, PA-C  metroNIDAZOLE (FLAGYL) 500 MG tablet Take 1 tablet (500 mg total) by mouth 2 (two) times daily. 11/30/17  Yes Melene Plan, DO  naproxen (NAPROSYN) 500 MG tablet Take 1 tablet (500 mg total) by mouth 2 (two) times daily with a meal. 09/15/17  Yes Mathews Robinsons B, PA-C  oxyCODONE-acetaminophen (PERCOCET/ROXICET) 5-325 MG tablet Take 1 tablet by mouth every 4 (four) hours as needed (pain scale 4-7). 05/07/16  Yes Lorne Skeens, MD    No Known Allergies  Patient Active Problem List   Diagnosis Date Noted  . Severe preeclampsia 05/03/2016  . 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 Pseudotumor cerebri 07/31/2014  . Acanthosis nigricans 01/16/2012  . LVH (left ventricular hypertrophy) 01/16/2012    Past Medical History:  Diagnosis Date  . 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 Hypertension    was taking amlodipine stopped about 1 yr ago - no insurance - had been on meds since age 40  . Pregnancy induced hypertension     Past Surgical History:  Procedure Laterality Date  . CESAREAN SECTION N/A 05/04/2016   Procedure: CESAREAN SECTION;  Surgeon: Tilda Burrow, MD;  Location: [facility] BIRTHING SUITES;  Service: Obstetrics;  Laterality: N/A;  . LUMBAR PUNCTURE  07/20/2014   fluid build up around spinal column  . NO PAST SURGERIES      Social History   Socioeconomic History  . Marital status: Single    Spouse name: Not on file  . Number of children: Not on file  . Years of education: Not on file  . Highest education level: Not on file  Occupational History    Employer: [employer]    Comment: [employer]  Social Needs  . Financial resource strain: Not on file  . Food insecurity:    Worry: Not on file    Inability: Not on file  . Transportation needs:    Medical: Not on file    Non-medical: Not on file  Tobacco Use  . Smoking status: Never Smoker  . Smokeless tobacco: Never Used  Substance and Sexual Activity  . Alcohol use: No    Comment: Rarely (less than once per month)  . Drug use: Yes  Types: Marijuana  . Sexual activity: Yes    Birth control/protection: None  Lifestyle  . Physical activity:    Days per week: Not on file    Minutes per session: Not on file  . Stress: Not on file  Relationships  . Social connections:    Talks on phone: Not on file    Gets together: Not on file    Attends religious service: Not on file    Active member of club or organization: Not on file    Attends meetings of clubs or organizations: Not on file    Relationship status: Not on file  . Intimate partner violence:    Fear of current or ex partner: Not on file    Emotionally abused: Not on file    Physically abused: Not on file    Forced sexual activity: Not on file  Other Topics Concern  . Not  on file  Social History Narrative  . Not on file    Family History  Problem Relation Age of Onset  . Diabetes Maternal Grandmother   . Diabetes Paternal Grandmother   . Thyroid disease Mother      Review of Systems  Constitutional: Negative.  Negative for chills and fever.  HENT: Negative.  Negative for sore throat.   Eyes: Negative.   Respiratory: Negative.  Negative for cough and shortness of breath.   Cardiovascular: Negative.  Negative for chest pain and palpitations.  Gastrointestinal: Positive for abdominal pain and nausea.  Genitourinary: Negative.  Negative for dysuria, frequency, hematuria and urgency.  Musculoskeletal: Negative.  Negative for back pain, myalgias and neck pain.  Skin: Negative.  Negative for rash.  Neurological: Negative.  Negative for dizziness and headaches.  Endo/Heme/Allergies: Negative.   All other systems reviewed and are negative.   Vitals:   01/29/18 1713  BP: (!) 132/92  Pulse: 88  Resp: 17  Temp: 98.4 F (36.9 C)  SpO2: 98%    Physical Exam  Constitutional: She is oriented to person, place, and time. She appears well-developed and well-nourished.  HENT:  Head: Normocephalic and atraumatic.  Nose: Nose normal.  Mouth/Throat: Oropharynx is clear and moist.  Eyes: Pupils are equal, round, and reactive to light. Conjunctivae and EOM are normal.  Neck: Normal range of motion. Neck supple.  Cardiovascular: Normal rate, regular rhythm and normal heart sounds.  Pulmonary/Chest: Effort normal and breath sounds normal.  Abdominal: Soft. Bowel sounds are normal. She exhibits no distension, no ascites, no pulsatile midline mass and no mass. There is tenderness in the suprapubic area. There is no rigidity, no rebound and no guarding. No hernia.  Musculoskeletal: Normal range of motion. She exhibits no edema.  Neurological: She is alert and oriented to person, place, and time. No sensory deficit. She exhibits normal muscle tone.  Skin: Skin is  warm and dry. Capillary refill takes less than 2 seconds. No rash noted.  Psychiatric: She has a normal mood and affect. Her behavior is normal.  Vitals reviewed.   Results for orders placed or performed in visit on 01/29/18 (from the past 24 hour(s))  POCT urinalysis dipstick     Status: None   Collection Time: 01/29/18  5:37 PM  Result Value Ref Range   Color, UA yellow yellow   Clarity, UA clear clear   Glucose, UA negative negative mg/dL   Bilirubin, UA negative negative   Ketones, POC UA negative negative mg/dL   Spec Grav, UA 1.914 7.829 - 1.025   Blood, UA  negative negative   pH, UA 5.5 5.0 - 8.0   Protein Ur, POC negative negative mg/dL   Urobilinogen, UA 0.2 0.2 or 1.0 E.U./dL   Nitrite, UA Negative Negative   Leukocytes, UA Negative Negative  POCT urine pregnancy     Status: None   Collection Time: 01/29/18  5:40 PM  Result Value Ref Range   Preg Test, Ur Negative Negative  POCT CBC     Status: Abnormal   Collection Time: 01/29/18  5:45 PM  Result Value Ref Range   WBC 6.0 4.6 - 10.2 K/uL   Lymph, poc 3.1 0.6 - 3.4   POC LYMPH PERCENT 51.5 (A) 10 - 50 %L   MID (cbc) 0.3 0 - 0.9   POC MID % 4.8 0 - 12 %M   POC Granulocyte 2.6 2 - 6.9   Granulocyte percent 43.7 37 - 80 %G   RBC 3.38 (A) 4.04 - 5.48 M/uL   Hemoglobin 10.3 (A) 12.2 - 16.2 g/dL   HCT, POC 16.1 (A) 09.6 - 47.9 %   MCV 92.6 80 - 97 fL   MCH, POC 30.4 27 - 31.2 pg   MCHC 32.8 31.8 - 35.4 g/dL   RDW, POC 04.5 %   Platelet Count, POC 351 142 - 424 K/uL   MPV 5.9 0 - 99.8 fL   A total of 25 minutes was spent in the room with the patient, greater than 50% of which was in counseling/coordination of care regarding differential diagnosis, management, medications, and need for close monitoring of symptoms and follow-up.  Lower abdominal pain No red flag signs or symptoms.  Benign abdominal exam.  Normal white count.  Pregnancy test negative.  Urinalysis negative.  Clinically better today than yesterday.  Able  to eat and drink.  No fever or chills.  Bentyl prescribed for as needed use.  Will monitor symptoms.  Advised to return if worse.   ASSESSMENT & PLAN: Martita was seen today for abdominal pain.  Diagnoses and all orders for this visit:  Lower abdominal pain -     POCT CBC -     POCT urinalysis dipstick -     POCT urine pregnancy -     Comprehensive metabolic panel -     dicyclomine (BENTYL) 20 MG tablet; Take 1 tablet (20 mg total) by mouth every 6 (six) hours.     Patient Instructions       IF you received an x-ray today, you will receive an invoice from [facility]. Please contact [facility] at [phone] with questions or concerns regarding your invoice.   IF you received labwork today, you will receive an invoice from [facility]. Please contact [facility] at [phone] with questions or concerns regarding your invoice.   Our billing staff will not be able to assist you with questions regarding bills from these companies.  You will be contacted with the lab results as soon as they are available. The fastest way to get your results is to activate your My Chart account. Instructions are located on the last page of this paperwork. If you have not heard from [place] regarding the results in 2 weeks, please contact this office.      Abdominal Pain, Adult Many things can cause belly (abdominal) pain. Most times, belly pain is not dangerous. Many cases of belly pain can be watched and treated at home. Sometimes belly pain is serious, though. Your doctor will try to find the cause of your belly pain. Follow these  instructions at home:  Take over-the-counter and prescription medicines only as told by your doctor. Do not take medicines that help you poop (laxatives) unless told to by your doctor.  Drink enough fluid to keep your pee (urine) clear or pale yellow.  Watch your belly pain for any changes.  Keep all follow-up visits as told by your doctor. This is  important. Contact a doctor if:  Your belly pain changes or gets worse.  You are not hungry, or you lose weight without trying.  You are having trouble pooping (constipated) or have watery poop (diarrhea) for more than 2-3 days.  You have pain when you pee or poop.  Your belly pain wakes you up at night.  Your pain gets worse with meals, after eating, or with certain foods.  You are throwing up and cannot keep anything down.  You have a fever. Get help right away if:  Your pain does not go away as soon as your doctor says it should.  You cannot stop throwing up.  Your pain is only in areas of your belly, such as the right side or the left lower part of the belly.  You have bloody or black poop, or poop that looks like tar.  You have very bad pain, cramping, or bloating in your belly.  You have signs of not having enough fluid or water in your body (dehydration), such as: ? Dark pee, very little pee, or no pee. ? Cracked lips. ? Dry mouth. ? Sunken eyes. ? Sleepiness. ? Weakness. This information is not intended to replace advice given to you by your health care provider. Make sure you discuss any questions you have with your health care provider. Document Released: 03/06/2008 Document Revised: 04/07/2016 Document Reviewed: 03/01/2016 Elsevier Interactive Patient Education  2018 [employer]     Edwina Barth, MD [facility]

## 2018-01-29 NOTE — Assessment & Plan Note (Signed)
No red flag signs or symptoms.  Benign abdominal exam.  Normal white count.  Pregnancy test negative.  Urinalysis negative.  Clinically better today than yesterday.  Able to eat and drink.  No fever or chills.  Bentyl prescribed for as needed use.  Will monitor symptoms.  Advised to return if worse.

## 2018-01-29 NOTE — Patient Instructions (Addendum)
     IF you received an x-ray today, you will receive an invoice from [facility]. Please contact [facility] at [phone] with questions or concerns regarding your invoice.   IF you received labwork today, you will receive an invoice from [facility]. Please contact [facility] at [phone] with questions or concerns regarding your invoice.   Our billing staff will not be able to assist you with questions regarding bills from these companies.  You will be contacted with the lab results as soon as they are available. The fastest way to get your results is to activate your My Chart account. Instructions are located on the last page of this paperwork. If you have not heard from us regarding the results in 2 weeks, please contact this office.      Abdominal Pain, Adult Many things can cause belly (abdominal) pain. Most times, belly pain is not dangerous. Many cases of belly pain can be watched and treated at home. Sometimes belly pain is serious, though. Your doctor will try to find the cause of your belly pain. Follow these instructions at home:  Take over-the-counter and prescription medicines only as told by your doctor. Do not take medicines that help you poop (laxatives) unless told to by your doctor.  Drink enough fluid to keep your pee (urine) clear or pale yellow.  Watch your belly pain for any changes.  Keep all follow-up visits as told by your doctor. This is important. Contact a doctor if:  Your belly pain changes or gets worse.  You are not hungry, or you lose weight without trying.  You are having trouble pooping (constipated) or have watery poop (diarrhea) for more than 2-3 days.  You have pain when you pee or poop.  Your belly pain wakes you up at night.  Your pain gets worse with meals, after eating, or with certain foods.  You are throwing up and cannot keep anything down.  You have a fever. Get help right away if:  Your pain does not go  away as soon as your doctor says it should.  You cannot stop throwing up.  Your pain is only in areas of your belly, such as the right side or the left lower part of the belly.  You have bloody or black poop, or poop that looks like tar.  You have very bad pain, cramping, or bloating in your belly.  You have signs of not having enough fluid or water in your body (dehydration), such as: ? Dark pee, very little pee, or no pee. ? Cracked lips. ? Dry mouth. ? Sunken eyes. ? Sleepiness. ? Weakness. This information is not intended to replace advice given to you by your health care provider. Make sure you discuss any questions you have with your health care provider. Document Released: 03/06/2008 Document Revised: 04/07/2016 Document Reviewed: 03/01/2016 [employer] Interactive Patient Education  2018 [employer]  

## 2018-01-30 ENCOUNTER — Encounter: Payer: Self-pay | Admitting: *Deleted

## 2018-01-30 LAB — COMPREHENSIVE METABOLIC PANEL
ALT: 14 IU/L (ref 0–32)
AST: 19 IU/L (ref 0–40)
Albumin/Globulin Ratio: 1.5 (ref 1.2–2.2)
Albumin: 4.6 g/dL (ref 3.5–5.5)
Alkaline Phosphatase: 74 IU/L (ref 39–117)
BUN/Creatinine Ratio: 12 (ref 9–23)
BUN: 14 mg/dL (ref 6–20)
Bilirubin Total: 0.2 mg/dL (ref 0.0–1.2)
CO2: 19 mmol/L — ABNORMAL LOW (ref 20–29)
Calcium: 9.4 mg/dL (ref 8.7–10.2)
Chloride: 105 mmol/L (ref 96–106)
Creatinine, Ser: 1.18 mg/dL — ABNORMAL HIGH (ref 0.57–1.00)
GFR calc Af Amer: 74 mL/min/{1.73_m2} (ref >59)
GFR calc non Af Amer: 64 mL/min/{1.73_m2} (ref >59)
Globulin, Total: 3 g/dL (ref 1.5–4.5)
Glucose: 78 mg/dL (ref 65–99)
Potassium: 4.6 mmol/L (ref 3.5–5.2)
Sodium: 139 mmol/L (ref 134–144)
Total Protein: 7.6 g/dL (ref 6.0–8.5)

## 2018-01-31 ENCOUNTER — Encounter: Payer: Self-pay | Admitting: Urgent Care

## 2018-01-31 ENCOUNTER — Ambulatory Visit (INDEPENDENT_AMBULATORY_CARE_PROVIDER_SITE_OTHER): Payer: BLUE CROSS/BLUE SHIELD | Admitting: Urgent Care

## 2018-01-31 ENCOUNTER — Other Ambulatory Visit: Payer: Self-pay

## 2018-01-31 VITALS — BP 118/88 | HR 89 | Temp 98.5°F | Ht 62.0 in | Wt 208.2 lb

## 2018-01-31 DIAGNOSIS — Z0000 Encounter for general adult medical examination without abnormal findings: Secondary | ICD-10-CM | POA: Diagnosis not present

## 2018-01-31 DIAGNOSIS — R51 Headache: Secondary | ICD-10-CM

## 2018-01-31 DIAGNOSIS — Z113 Encounter for screening for infections with a predominantly sexual mode of transmission: Secondary | ICD-10-CM | POA: Diagnosis not present

## 2018-01-31 DIAGNOSIS — R87619 Unspecified abnormal cytological findings in specimens from cervix uteri: Secondary | ICD-10-CM

## 2018-01-31 DIAGNOSIS — Z13220 Encounter for screening for lipoid disorders: Secondary | ICD-10-CM | POA: Diagnosis not present

## 2018-01-31 DIAGNOSIS — Z130 Encounter for screening for diseases of the blood and blood-forming organs and certain disorders involving the immune mechanism: Secondary | ICD-10-CM | POA: Diagnosis not present

## 2018-01-31 DIAGNOSIS — R519 Headache, unspecified: Secondary | ICD-10-CM

## 2018-01-31 DIAGNOSIS — Z13228 Encounter for screening for other metabolic disorders: Secondary | ICD-10-CM | POA: Diagnosis not present

## 2018-01-31 DIAGNOSIS — I10 Essential (primary) hypertension: Secondary | ICD-10-CM

## 2018-01-31 DIAGNOSIS — Z1329 Encounter for screening for other suspected endocrine disorder: Secondary | ICD-10-CM | POA: Diagnosis not present

## 2018-01-31 DIAGNOSIS — R87610 Atypical squamous cells of undetermined significance on cytologic smear of cervix (ASC-US): Secondary | ICD-10-CM | POA: Diagnosis not present

## 2018-01-31 MED ORDER — SUMATRIPTAN SUCCINATE 50 MG PO TABS: ORAL_TABLET | ORAL | 0 refills | Status: DC

## 2018-01-31 NOTE — Patient Instructions (Signed)
Health Maintenance, Female Adopting a healthy lifestyle and getting preventive care can go a long way to promote health and wellness. Talk with your health care provider about what schedule of regular examinations is right for you. This is a good chance for you to check in with your provider about disease prevention and staying healthy. In between checkups, there are plenty of things you can do on your own. Experts have done a lot of research about which lifestyle changes and preventive measures are most likely to keep you healthy. Ask your health care provider for more information. Weight and diet Eat a healthy diet  Be sure to include plenty of vegetables, fruits, low-fat dairy products, and lean protein.  Do not eat a lot of foods high in solid fats, added sugars, or salt.  Get regular exercise. This is one of the most important things you can do for your health. ? Most adults should exercise for at least 150 minutes each week. The exercise should increase your heart rate and make you sweat (moderate-intensity exercise). ? Most adults should also do strengthening exercises at least twice a week. This is in addition to the moderate-intensity exercise.  Maintain a healthy weight  Body mass index (BMI) is a measurement that can be used to identify possible weight problems. It estimates body fat based on height and weight. Your health care provider can help determine your BMI and help you achieve or maintain a healthy weight.  For females 20 years of age and older: ? A BMI below 18.5 is considered underweight. ? A BMI of 18.5 to 24.9 is normal. ? A BMI of 25 to 29.9 is considered overweight. ? A BMI of 30 and above is considered obese.  Watch levels of cholesterol and blood lipids  You should start having your blood tested for lipids and cholesterol at 26 years of age, then have this test every 5 years.  You may need to have your cholesterol levels checked more often if: ? Your lipid or  cholesterol levels are high. ? You are older than 26 years of age. ? You are at high risk for heart disease.  Cancer screening Lung Cancer  Lung cancer screening is recommended for adults 55-80 years old who are at high risk for lung cancer because of a history of smoking.  A yearly low-dose CT scan of the lungs is recommended for people who: ? Currently smoke. ? Have quit within the past 15 years. ? Have at least a 30-pack-year history of smoking. A pack year is smoking an average of one pack of cigarettes a day for 1 year.  Yearly screening should continue until it has been 15 years since you quit.  Yearly screening should stop if you develop a health problem that would prevent you from having lung cancer treatment.  Breast Cancer  Practice breast self-awareness. This means understanding how your breasts normally appear and feel.  It also means doing regular breast self-exams. Let your health care provider know about any changes, no matter how small.  If you are in your 20s or 30s, you should have a clinical breast exam (CBE) by a health care provider every 1-3 years as part of a regular health exam.  If you are 40 or older, have a CBE every year. Also consider having a breast X-ray (mammogram) every year.  If you have a family history of breast cancer, talk to your health care provider about genetic screening.  If you are at high risk   for breast cancer, talk to your health care provider about having an MRI and a mammogram every year.  Breast cancer gene (BRCA) assessment is recommended for women who have family members with BRCA-related cancers. BRCA-related cancers include: ? Breast. ? Ovarian. ? Tubal. ? Peritoneal cancers.  Results of the assessment will determine the need for genetic counseling and BRCA1 and BRCA2 testing.  Cervical Cancer Your health care provider may recommend that you be screened regularly for cancer of the pelvic organs (ovaries, uterus, and  vagina). This screening involves a pelvic examination, including checking for microscopic changes to the surface of your cervix (Pap test). You may be encouraged to have this screening done every 3 years, beginning at age 22.  For women ages 56-65, health care providers may recommend pelvic exams and Pap testing every 3 years, or they may recommend the Pap and pelvic exam, combined with testing for human papilloma virus (HPV), every 5 years. Some types of HPV increase your risk of cervical cancer. Testing for HPV may also be done on women of any age with unclear Pap test results.  Other health care providers may not recommend any screening for nonpregnant women who are considered low risk for pelvic cancer and who do not have symptoms. Ask your health care provider if a screening pelvic exam is right for you.  If you have had past treatment for cervical cancer or a condition that could lead to cancer, you need Pap tests and screening for cancer for at least 20 years after your treatment. If Pap tests have been discontinued, your risk factors (such as having a new sexual partner) need to be reassessed to determine if screening should resume. Some women have medical problems that increase the chance of getting cervical cancer. In these cases, your health care provider may recommend more frequent screening and Pap tests.  Colorectal Cancer  This type of cancer can be detected and often prevented.  Routine colorectal cancer screening usually begins at 26 years of age and continues through 26 years of age.  Your health care provider may recommend screening at an earlier age if you have risk factors for colon cancer.  Your health care provider may also recommend using home test kits to check for hidden blood in the stool.  A small camera at the end of a tube can be used to examine your colon directly (sigmoidoscopy or colonoscopy). This is done to check for the earliest forms of colorectal  cancer.  Routine screening usually begins at age 33.  Direct examination of the colon should be repeated every 5-10 years through 26 years of age. However, you may need to be screened more often if early forms of precancerous polyps or small growths are found.  Skin Cancer  Check your skin from head to toe regularly.  Tell your health care provider about any new moles or changes in moles, especially if there is a change in a mole's shape or color.  Also tell your health care provider if you have a mole that is larger than the size of a pencil eraser.  Always use sunscreen. Apply sunscreen liberally and repeatedly throughout the day.  Protect yourself by wearing long sleeves, pants, a wide-brimmed hat, and sunglasses whenever you are outside.  Heart disease, diabetes, and high blood pressure  High blood pressure causes heart disease and increases the risk of stroke. High blood pressure is more likely to develop in: ? People who have blood pressure in the high end of  the normal range (130-139/85-89 mm Hg). ? People who are overweight or obese. ? People who are African American.  If you are 21-29 years of age, have your blood pressure checked every 3-5 years. If you are 3 years of age or older, have your blood pressure checked every year. You should have your blood pressure measured twice-once when you are at a hospital or clinic, and once when you are not at a hospital or clinic. Record the average of the two measurements. To check your blood pressure when you are not at a hospital or clinic, you can use: ? An automated blood pressure machine at a pharmacy. ? A home blood pressure monitor.  If you are between 17 years and 37 years old, ask your health care provider if you should take aspirin to prevent strokes.  Have regular diabetes screenings. This involves taking a blood sample to check your fasting blood sugar level. ? If you are at a normal weight and have a low risk for diabetes,  have this test once every three years after 26 years of age. ? If you are overweight and have a high risk for diabetes, consider being tested at a younger age or more often. Preventing infection Hepatitis B  If you have a higher risk for hepatitis B, you should be screened for this virus. You are considered at high risk for hepatitis B if: ? You were born in a country where hepatitis B is common. Ask your health care provider which countries are considered high risk. ? Your parents were born in a high-risk country, and you have not been immunized against hepatitis B (hepatitis B vaccine). ? You have HIV or AIDS. ? You use needles to inject street drugs. ? You live with someone who has hepatitis B. ? You have had sex with someone who has hepatitis B. ? You get hemodialysis treatment. ? You take certain medicines for conditions, including cancer, organ transplantation, and autoimmune conditions.  Hepatitis C  Blood testing is recommended for: ? Everyone born from [birth year] through 1964. ? Anyone with known risk factors for hepatitis C.  Sexually transmitted infections (STIs)  You should be screened for sexually transmitted infections (STIs) including gonorrhea and chlamydia if: ? You are sexually active and are younger than 26 years of age. ? You are older than 26 years of age and your health care provider tells you that you are at risk for this type of infection. ? Your sexual activity has changed since you were last screened and you are at an increased risk for chlamydia or gonorrhea. Ask your health care provider if you are at risk.  If you do not have HIV, but are at risk, it may be recommended that you take a prescription medicine daily to prevent HIV infection. This is called pre-exposure prophylaxis (PrEP). You are considered at risk if: ? You are sexually active and do not regularly use condoms or know the HIV status of your partner(s). ? You take drugs by injection. ? You are  sexually active with a partner who has HIV.  Talk with your health care provider about whether you are at high risk of being infected with HIV. If you choose to begin PrEP, you should first be tested for HIV. You should then be tested every 3 months for as long as you are taking PrEP. Pregnancy  If you are premenopausal and you may become pregnant, ask your health care provider about preconception counseling.  If you may become  pregnant, take 400 to 800 micrograms (mcg) of folic acid every day.  If you want to prevent pregnancy, talk to your health care provider about birth control (contraception). Osteoporosis and menopause  Osteoporosis is a disease in which the bones lose minerals and strength with aging. This can result in serious bone fractures. Your risk for osteoporosis can be identified using a bone density scan.  If you are 60 years of age or older, or if you are at risk for osteoporosis and fractures, ask your health care provider if you should be screened.  Ask your health care provider whether you should take a calcium or vitamin D supplement to lower your risk for osteoporosis.  Menopause may have certain physical symptoms and risks.  Hormone replacement therapy may reduce some of these symptoms and risks. Talk to your health care provider about whether hormone replacement therapy is right for you. Follow these instructions at home:  Schedule regular health, dental, and eye exams.  Stay current with your immunizations.  Do not use any tobacco products including cigarettes, chewing tobacco, or electronic cigarettes.  If you are pregnant, do not drink alcohol.  If you are breastfeeding, limit how much and how often you drink alcohol.  Limit alcohol intake to no more than 1 drink per day for nonpregnant women. One drink equals 12 ounces of beer, 5 ounces of wine, or 1 ounces of hard liquor.  Do not use street drugs.  Do not share needles.  Ask your health care  provider for help if you need support or information about quitting drugs.  Tell your health care provider if you often feel depressed.  Tell your health care provider if you have ever been abused or do not feel safe at home. This information is not intended to replace advice given to you by your health care provider. Make sure you discuss any questions you have with your health care provider. Document Released: 04/03/2011 Document Revised: 02/24/2016 Document Reviewed: 06/22/2015 Elsevier Interactive Patient Education  2018 [employer].    Pap Test Why am I having this test? A pap test is sometimes called a pap smear. It is a screening test that is used to check for signs of cancer of the vagina, cervix, and uterus. The test can also identify the presence of infection or precancerous changes. Your health care provider will likely recommend you have this test done on a regular basis. This test may be done:  Every 3 years, starting at age 39.  Every 5 years, in combination with testing for the presence of human papillomavirus (HPV).  More or less often depending on other medical conditions.  What kind of sample is taken? Using a small cotton swab, plastic spatula, or brush, your health care provider will collect a sample of cells from the surface of your cervix. Your cervix is the opening to your uterus, also called a womb. Secretions from the cervix and vagina may also be collected. How do I prepare for this test?  Be aware of where you are in your menstrual cycle. You may be asked to reschedule the test if you are menstruating on the day of the test.  You may need to reschedule if you have a known vaginal infection on the day of the test.  You may be asked to avoid douching or taking a bath the day before or the day of the test.  Some medicines can cause abnormal test results, such as digitalis and tetracycline. Talk with your health  care provider before your test if you take one of  these medicines. What do the results mean? Abnormal test results may indicate a number of health conditions. These may include:  Cancer. Although pap test results cannot be used to diagnose cancer of the cervix, vagina, or uterus, they may suggest the possibility of cancer. Further tests would be required to determine if cancer is present.  Sexually transmitted disease.  Fungal infection.  Parasite infection.  Herpes infection.  A condition causing or contributing to infertility.  It is your responsibility to obtain your test results. Ask the lab or department performing the test when and how you will get your results. Contact your health care provider to discuss any questions you have about your results. Talk with your health care provider to discuss your results, treatment options, and if necessary, the need for more tests. Talk with your health care provider if you have any questions about your results. This information is not intended to replace advice given to you by your health care provider. Make sure you discuss any questions you have with your health care provider. Document Released: 12/09/2002 Document Revised: 05/24/2016 Document Reviewed: 02/09/2014 Elsevier Interactive Patient Education  2018 [employer].     Hypertension Hypertension, commonly called high blood pressure, is when the force of blood pumping through the arteries is too strong. The arteries are the blood vessels that carry blood from the heart throughout the body. Hypertension forces the heart to work harder to pump blood and may cause arteries to become narrow or stiff. Having untreated or uncontrolled hypertension can cause heart attacks, strokes, kidney disease, and other problems. A blood pressure reading consists of a higher number over a lower number. Ideally, your blood pressure should be below 120/80. The first ("top") number is called the systolic pressure. It is a measure of the pressure in your  arteries as your heart beats. The second ("bottom") number is called the diastolic pressure. It is a measure of the pressure in your arteries as the heart relaxes. What are the causes? The cause of this condition is not known. What increases the risk? Some risk factors for high blood pressure are under your control. Others are not. Factors you can change  Smoking.  Having type 2 diabetes mellitus, high cholesterol, or both.  Not getting enough exercise or physical activity.  Being overweight.  Having too much fat, sugar, calories, or salt (sodium) in your diet.  Drinking too much alcohol. Factors that are difficult or impossible to change  Having chronic kidney disease.  Having a family history of high blood pressure.  Age. Risk increases with age.  Race. You may be at higher risk if you are African-American.  Gender. Men are at higher risk than women before age 34. After age 74, women are at higher risk than men.  Having obstructive sleep apnea.  Stress. What are the signs or symptoms? Extremely high blood pressure (hypertensive crisis) may cause:  Headache.  Anxiety.  Shortness of breath.  Nosebleed.  Nausea and vomiting.  Severe chest pain.  Jerky movements you cannot control (seizures).  How is this diagnosed? This condition is diagnosed by measuring your blood pressure while you are seated, with your arm resting on a surface. The cuff of the blood pressure monitor will be placed directly against the skin of your upper arm at the level of your heart. It should be measured at least twice using the same arm. Certain conditions can cause a difference in blood  pressure between your right and left arms. Certain factors can cause blood pressure readings to be lower or higher than normal (elevated) for a short period of time:  When your blood pressure is higher when you are in a health care provider's office than when you are at home, this is called white coat  hypertension. Most people with this condition do not need medicines.  When your blood pressure is higher at home than when you are in a health care provider's office, this is called masked hypertension. Most people with this condition may need medicines to control blood pressure.  If you have a high blood pressure reading during one visit or you have normal blood pressure with other risk factors:  You may be asked to return on a different day to have your blood pressure checked again.  You may be asked to monitor your blood pressure at home for 1 week or longer.  If you are diagnosed with hypertension, you may have other blood or imaging tests to help your health care provider understand your overall risk for other conditions. How is this treated? This condition is treated by making healthy lifestyle changes, such as eating healthy foods, exercising more, and reducing your alcohol intake. Your health care provider may prescribe medicine if lifestyle changes are not enough to get your blood pressure under control, and if:  Your systolic blood pressure is above 130.  Your diastolic blood pressure is above 80.  Your personal target blood pressure may vary depending on your medical conditions, your age, and other factors. Follow these instructions at home: Eating and drinking  Eat a diet that is high in fiber and potassium, and low in sodium, added sugar, and fat. An example eating plan is called the DASH (Dietary Approaches to Stop Hypertension) diet. To eat this way: ? Eat plenty of fresh fruits and vegetables. Try to fill half of your plate at each meal with fruits and vegetables. ? Eat whole grains, such as whole wheat pasta, brown rice, or whole grain bread. Fill about one quarter of your plate with whole grains. ? Eat or drink low-fat dairy products, such as skim milk or low-fat yogurt. ? Avoid fatty cuts of meat, processed or cured meats, and poultry with skin. Fill about one quarter of  your plate with lean proteins, such as fish, chicken without skin, beans, eggs, and tofu. ? Avoid premade and processed foods. These tend to be higher in sodium, added sugar, and fat.  Reduce your daily sodium intake. Most people with hypertension should eat less than 1,500 mg of sodium a day.  Limit alcohol intake to no more than 1 drink a day for nonpregnant women and 2 drinks a day for men. One drink equals 12 oz of beer, 5 oz of wine, or 1 oz of hard liquor. Lifestyle  Work with your health care provider to maintain a healthy body weight or to lose weight. Ask what an ideal weight is for you.  Get at least 30 minutes of exercise that causes your heart to beat faster (aerobic exercise) most days of the week. Activities may include walking, swimming, or biking.  Include exercise to strengthen your muscles (resistance exercise), such as pilates or lifting weights, as part of your weekly exercise routine. Try to do these types of exercises for 30 minutes at least 3 days a week.  Do not use any products that contain nicotine or tobacco, such as cigarettes and e-cigarettes. If you need help quitting, ask  your health care provider.  Monitor your blood pressure at home as told by your health care provider.  Keep all follow-up visits as told by your health care provider. This is important. Medicines  Take over-the-counter and prescription medicines only as told by your health care provider. Follow directions carefully. Blood pressure medicines must be taken as prescribed.  Do not skip doses of blood pressure medicine. Doing this puts you at risk for problems and can make the medicine less effective.  Ask your health care provider about side effects or reactions to medicines that you should watch for. Contact a health care provider if:  You think you are having a reaction to a medicine you are taking.  You have headaches that keep coming back (recurring).  You feel dizzy.  You have  swelling in your ankles.  You have trouble with your vision. Get help right away if:  You develop a severe headache or confusion.  You have unusual weakness or numbness.  You feel faint.  You have severe pain in your chest or abdomen.  You vomit repeatedly.  You have trouble breathing. Summary  Hypertension is when the force of blood pumping through your arteries is too strong. If this condition is not controlled, it may put you at risk for serious complications.  Your personal target blood pressure may vary depending on your medical conditions, your age, and other factors. For most people, a normal blood pressure is less than 120/80.  Hypertension is treated with lifestyle changes, medicines, or a combination of both. Lifestyle changes include weight loss, eating a healthy, low-sodium diet, exercising more, and limiting alcohol. This information is not intended to replace advice given to you by your health care provider. Make sure you discuss any questions you have with your health care provider. Document Released: 09/18/2005 Document Revised: 08/16/2016 Document Reviewed: 08/16/2016 Elsevier Interactive Patient Education  [employer].

## 2018-01-31 NOTE — Progress Notes (Signed)
MRN: [id number]  Subjective:   [name] is a 27 y.o. female presenting for annual physical exam.  Patient is a single mother, works as a Conservation officer, nature at [employer].  She would like to have STI screen today. Has good relationships at home, has a good support network. Denies smoking cigarettes or drinking alcohol.   Medical care team includes: PCP: Patient, No Pcp Per Vision: Patient wears eyeglasses, has not had an eye exam in a couple of years.  She states that she was planning on setting up her own eye appointment and is not wanting a referral. Dental: She has dental insurance but has not had the time set up an appointment, plans on doing so soon. OB/GYN: Was supposed to have a colposcopy in 2017 but was unable to do this in the past due to her pregnancy.  Specialists: None.  Health Maintenance: Age-appropriate screening includes testing for HIV, STIs, performing Pap smear for cervical cancer screening.  Patty Gilmore is not currently taking any medications and has No Known Allergies.  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 Also  has a past surgical history that includes No past surgeries; Lumbar puncture (07/20/2014); and Cesarean section (N/A, 05/04/2016). Her family history includes Diabetes in her maternal grandmother and paternal grandmother; Thyroid disease in her mother.  Immunizations Immunization History  Administered Date(s) Administered  . HPV Quadrivalent 01/19/2012  . Influenza,inj,Quad PF,6+ Mos 01/13/2016, 06/14/2016  . Tdap 01/26/2014, 04/24/2016   Review of Systems  Constitutional: Negative for chills, diaphoresis, fever, malaise/fatigue and weight loss.  HENT: Negative for congestion, ear discharge, ear pain, hearing loss, nosebleeds, sore throat and tinnitus.   Eyes: Negative for blurred vision, double vision, photophobia, pain, discharge and redness.  Respiratory: Negative  for cough, shortness of breath and wheezing.   Cardiovascular: Negative for chest pain, palpitations and leg swelling.  Gastrointestinal: Negative for abdominal pain, blood in stool, constipation, diarrhea, nausea and vomiting.  Genitourinary: Negative for dysuria, flank pain, frequency, hematuria and urgency.  Musculoskeletal: Negative for back pain, joint pain and myalgias.  Skin: Negative for itching and rash.  Neurological: Positive for headaches. Negative for dizziness, tingling, seizures, loss of consciousness and weakness.  Endo/Heme/Allergies: Negative for polydipsia.  Psychiatric/Behavioral: Negative for depression, hallucinations, memory loss, substance abuse and suicidal ideas. The patient is not nervous/anxious and does not have insomnia.    Objective:   Vitals: BP 118/88 (BP Location: Left Arm, Patient Position: Sitting, Cuff Size: Large)   Pulse 89   Temp 98.5 F (36.9 C) (Oral)   Ht  (1.575 m)   Wt 208 lb 3.2 oz (94.4 kg)   LMP 01/08/2018 (Approximate)   SpO2 98%   BMI 38.08 kg/m   BP Readings from Last 3 Encounters:  01/31/18 118/88  01/29/18 (!) 132/92  11/30/17 (!) 144/87   Physical Exam  Constitutional: She is oriented to person, place, and time. She appears well-developed and well-nourished.  HENT:  TM's intact bilaterally, no effusions or erythema. Nasal turbinates pink and moist, nasal passages patent. No sinus tenderness. Oropharynx clear, mucous membranes moist, dentition in good repair.  Eyes: Pupils are equal, round, and reactive to light. Conjunctivae and EOM are normal. Right eye exhibits no discharge. Left eye exhibits no discharge. No scleral icterus.  Neck: Normal range of motion. Neck supple. No thyromegaly present.  Cardiovascular: Normal rate, regular rhythm and intact distal pulses. Exam reveals no gallop and no friction rub.  No  murmur heard. Pulmonary/Chest: No respiratory distress. She has no wheezes. She has no rales.  Abdominal:  Soft. Bowel sounds are normal. She exhibits no distension and no mass. There is no tenderness.  Musculoskeletal: Normal range of motion. She exhibits no edema or tenderness.  Lymphadenopathy:    She has no cervical adenopathy.  Neurological: She is alert and oriented to person, place, and time. She has normal reflexes.  Skin: Skin is warm and dry. No rash noted. No erythema. No pallor.  Psychiatric: She has a normal mood and affect.   Assessment and Plan :   Annual physical exam - Plan: Lipid panel, TSH  Abnormal cervical Papanicolaou smear, unspecified abnormal pap finding - Plan: Pap IG, CT/NG NAA, and HPV (high risk)  Screening for deficiency anemia - Plan: CBC  Screening for metabolic disorder - Plan: Comprehensive metabolic panel  Screening for thyroid disorder  Screening cholesterol level  Routine screening for STI (sexually transmitted infection) - Plan: HIV antibody, RPR  Essential hypertension  Generalized headaches  She is a very pleasant young lady, medically stable, labs pending. Discussed healthy lifestyle, diet, exercise, preventative care, vaccinations, and addressed patient's concerns.  Patient states that ever since she started drinking more water and eating healthier, her headaches have improved significantly.  She has previously used Excedrin for migraines successfully but is requesting a prescription for migraine medicine.  I prescribed her Imitrex and counseled her on how to use this medication including side effects.  We reviewed her history of high blood pressure.  She reports that her most recent blood pressure readings are in the 120s to 140s and are the best that she is ever had them.  She is to be on multiple blood pressure medications, most consistently took amlodipine.  We will monitor her blood pressure for now and have her come back if she persistently gets blood pressure readings above 140s.  Follow-up with lab results.    Wallis Bamberg, PA-C Primary Care  at [facility] [phone] 01/31/2018  10:33 AM

## 2018-02-01 LAB — HIV ANTIBODY (ROUTINE TESTING W REFLEX): HIV Screen 4th Generation wRfx: NONREACTIVE

## 2018-02-01 LAB — COMPREHENSIVE METABOLIC PANEL
ALT: 11 IU/L (ref 0–32)
AST: 17 IU/L (ref 0–40)
Albumin/Globulin Ratio: 1.6 (ref 1.2–2.2)
Albumin: 4.4 g/dL (ref 3.5–5.5)
Alkaline Phosphatase: 73 IU/L (ref 39–117)
BUN/Creatinine Ratio: 13 (ref 9–23)
BUN: 17 mg/dL (ref 6–20)
Bilirubin Total: 0.2 mg/dL (ref 0.0–1.2)
CO2: 18 mmol/L — ABNORMAL LOW (ref 20–29)
Calcium: 9.2 mg/dL (ref 8.7–10.2)
Chloride: 107 mmol/L — ABNORMAL HIGH (ref 96–106)
Creatinine, Ser: 1.3 mg/dL — ABNORMAL HIGH (ref 0.57–1.00)
GFR calc Af Amer: 66 mL/min/{1.73_m2} (ref >59)
GFR calc non Af Amer: 57 mL/min/{1.73_m2} — ABNORMAL LOW (ref >59)
Globulin, Total: 2.8 g/dL (ref 1.5–4.5)
Glucose: 89 mg/dL (ref 65–99)
Potassium: 4.8 mmol/L (ref 3.5–5.2)
Sodium: 138 mmol/L (ref 134–144)
Total Protein: 7.2 g/dL (ref 6.0–8.5)

## 2018-02-01 LAB — CBC
Hematocrit: 31.2 % — ABNORMAL LOW (ref 34.0–46.6)
Hemoglobin: 10.6 g/dL — ABNORMAL LOW (ref 11.1–15.9)
MCH: 30.3 pg (ref 26.6–33.0)
MCHC: 34 g/dL (ref 31.5–35.7)
MCV: 89 fL (ref 79–97)
Platelets: 324 10*3/uL (ref 150–379)
RBC: 3.5 x10E6/uL — ABNORMAL LOW (ref 3.77–5.28)
RDW: 13.9 % (ref 12.3–15.4)
WBC: 4.7 10*3/uL (ref 3.4–10.8)

## 2018-02-01 LAB — LIPID PANEL
Chol/HDL Ratio: 3 ratio (ref 0.0–4.4)
Cholesterol, Total: 136 mg/dL (ref 100–199)
HDL: 46 mg/dL (ref >39)
LDL Calculated: 81 mg/dL (ref 0–99)
Triglycerides: 46 mg/dL (ref 0–149)
VLDL Cholesterol Cal: 9 mg/dL (ref 5–40)

## 2018-02-01 LAB — RPR: RPR Ser Ql: NONREACTIVE

## 2018-02-01 LAB — TSH: TSH: 0.541 u[IU]/mL (ref 0.450–4.500)

## 2018-02-04 ENCOUNTER — Telehealth: Payer: Self-pay | Admitting: *Deleted

## 2018-02-04 NOTE — Telephone Encounter (Signed)
Labs are not all resulted yet.  I was planning on letting patient know my recommendations once everything resulted.  I released them without notes until I have everything.

## 2018-02-04 NOTE — Telephone Encounter (Signed)
Patient advised.

## 2018-02-04 NOTE — Telephone Encounter (Signed)
Copied from CRM [id number]. Topic: Quick Communication - Lab Results >> Feb 04, 2018  9:44 AM Alexander Bergeron B wrote: Call pt to give lab results  Release Labs.

## 2018-02-04 NOTE — Telephone Encounter (Signed)
Informed patient when all results are in we will call her with results. Patient voiced understanding

## 2018-02-05 LAB — PAP IG, CT-NG NAA, HPV HIGH-RISK
Chlamydia, Nuc. Acid Amp: NEGATIVE
Gonococcus by Nucleic Acid Amp: NEGATIVE
HPV, high-risk: POSITIVE — AB
PAP Smear Comment: 0

## 2018-02-09 ENCOUNTER — Telehealth: Payer: Self-pay | Admitting: General Practice

## 2018-02-09 ENCOUNTER — Other Ambulatory Visit: Payer: Self-pay | Admitting: Urgent Care

## 2018-02-09 MED ORDER — ALPRAZOLAM 0.5 MG PO TABS: 0.5000 mg | ORAL_TABLET | Freq: Every evening | ORAL | 0 refills | Status: DC | PRN

## 2018-02-09 NOTE — Telephone Encounter (Signed)
Pt. Called with question about a phone call she alleges receiving a message on her voice mail from the PA and has a question about something said during message with respect to her Kidney's. Pt. Was unable to elaborate   Please Advise   Best Number:  [phone]

## 2018-02-11 NOTE — Telephone Encounter (Signed)
Called a spoke with patient Voiced understanding

## 2018-03-19 ENCOUNTER — Telehealth: Payer: Self-pay | Admitting: *Deleted

## 2018-03-19 ENCOUNTER — Other Ambulatory Visit (HOSPITAL_COMMUNITY)
Admission: RE | Admit: 2018-03-19 | Discharge: 2018-03-19 | Disposition: A | Discharge status: Home / Self Care | Payer: BLUE CROSS/BLUE SHIELD | Source: Ambulatory Visit | Attending: Family Medicine | Admitting: Family Medicine

## 2018-03-19 ENCOUNTER — Ambulatory Visit (INDEPENDENT_AMBULATORY_CARE_PROVIDER_SITE_OTHER): Payer: BLUE CROSS/BLUE SHIELD | Admitting: Family Medicine

## 2018-03-19 ENCOUNTER — Encounter: Payer: Self-pay | Admitting: Family Medicine

## 2018-03-19 VITALS — BP 155/109 | HR 71 | Ht 62.0 in | Wt 205.0 lb

## 2018-03-19 DIAGNOSIS — R87612 Low grade squamous intraepithelial lesion on cytologic smear of cervix (LGSIL): Secondary | ICD-10-CM | POA: Diagnosis not present

## 2018-03-19 DIAGNOSIS — R87610 Atypical squamous cells of undetermined significance on cytologic smear of cervix (ASC-US): Secondary | ICD-10-CM | POA: Diagnosis not present

## 2018-03-19 DIAGNOSIS — R87810 Cervical high risk human papillomavirus (HPV) DNA test positive: Secondary | ICD-10-CM | POA: Insufficient documentation

## 2018-03-19 DIAGNOSIS — N870 Mild cervical dysplasia: Secondary | ICD-10-CM

## 2018-03-19 DIAGNOSIS — N72 Inflammatory disease of cervix uteri: Secondary | ICD-10-CM | POA: Diagnosis not present

## 2018-03-19 LAB — POCT PREGNANCY, URINE: Preg Test, Ur: NEGATIVE

## 2018-03-19 MED ORDER — AMLODIPINE BESYLATE 5 MG PO TABS: 5.0000 mg | ORAL_TABLET | Freq: Every day | ORAL | 0 refills | Status: DC

## 2018-03-19 NOTE — Patient Instructions (Signed)
Colposcopy, Care After This sheet gives you information about how to care for yourself after your procedure. Your doctor may also give you more specific instructions. If you have problems or questions, contact your doctor. What can I expect after the procedure? If you did not have a tissue sample removed (did not have a biopsy), you may only have some spotting for a few days. You can go back to your normal activities. If you had a tissue sample removed, it is common to have:  Soreness and pain. This may last for a few days.  Light-headedness.  Mild bleeding from your vagina or dark-colored, grainy discharge from your vagina. This may last for a few days. You may need to wear a sanitary pad.  Spotting for at least 48 hours after the procedure.  Follow these instructions at home:  Take over-the-counter and prescription medicines only as told by your doctor. Ask your doctor what medicines you can start taking again. This is very important if you take blood-thinning medicine.  Do not drive or use heavy machinery while taking prescription pain medicine.  For 3 days, or as long as your doctor tells you, avoid: ? Douching. ? Using tampons. ? Having sex.  If you use birth control (contraception), keep using it.  Limit activity for the first day after the procedure. Ask your doctor what activities are safe for you.  It is up to you to get the results of your procedure. Ask your doctor when your results will be ready.  Keep all follow-up visits as told by your doctor. This is important. Contact a doctor if:  You get a skin rash. Get help right away if:  You are bleeding a lot from your vagina. It is a lot of bleeding if you are using more than one pad an hour for 2 hours in a row.  You have clumps of blood (blood clots) coming from your vagina.  You have a fever.  You have chills  You have pain in your lower belly (pelvic area).  You have signs of infection, such as vaginal  discharge that is: ? Different than usual. ? Yellow. ? Bad-smelling.  You have very pain or cramps in your lower belly that do not get better with medicine.  You feel light-headed.  You feel dizzy.  You pass out (faint). Summary  If you did not have a tissue sample removed (did not have a biopsy), you may only have some spotting for a few days. You can go back to your normal activities.  If you had a tissue sample removed, it is common to have mild pain and spotting for 48 hours.  For 3 days, or as long as your doctor tells you, avoid douching, using tampons and having sex.  Get help right away if you have bleeding, very bad pain, or signs of infection. This information is not intended to replace advice given to you by your health care provider. Make sure you discuss any questions you have with your health care provider. Document Released: 03/06/2008 Document Revised: 06/07/2016 Document Reviewed: 06/07/2016 Elsevier Interactive Patient Education  2018 Elsevier Inc.  Low-Sodium Eating Plan Sodium, which is an element that makes up salt, helps you maintain a healthy balance of fluids in your body. Too much sodium can increase your blood pressure and cause fluid and waste to be held in your body. Your health care provider or dietitian may recommend following this plan if you have high blood pressure (hypertension), kidney disease, liver  disease, or heart failure. Eating less sodium can help lower your blood pressure, reduce swelling, and protect your heart, liver, and kidneys. What are tips for following this plan? General guidelines  Most people on this plan should limit their sodium intake to 1,500-2,000 mg (milligrams) of sodium each day. Reading food labels  The Nutrition Facts label lists the amount of sodium in one serving of the food. If you eat more than one serving, you must multiply the listed amount of sodium by the number of servings.  Choose foods with less than 140 mg  of sodium per serving.  Avoid foods with 300 mg of sodium or more per serving. Shopping  Look for lower-sodium products, often labeled as "low-sodium" or "no salt added."  Always check the sodium content even if foods are labeled as "unsalted" or "no salt added".  Buy fresh foods. ? Avoid canned foods and premade or frozen meals. ? Avoid canned, cured, or processed meats  Buy breads that have less than 80 mg of sodium per slice. Cooking  Eat more home-cooked food and less restaurant, buffet, and fast food.  Avoid adding salt when cooking. Use salt-free seasonings or herbs instead of table salt or sea salt. Check with your health care provider or pharmacist before using salt substitutes.  Cook with plant-based oils, such as canola, sunflower, or olive oil. Meal planning  When eating at a restaurant, ask that your food be prepared with less salt or no salt, if possible.  Avoid foods that contain MSG (monosodium glutamate). MSG is sometimes added to [place] food, bouillon, and some canned foods. What foods are recommended? The items listed may not be a complete list. Talk with your dietitian about what dietary choices are best for you. Grains Low-sodium cereals, including oats, puffed wheat and rice, and shredded wheat. Low-sodium crackers. Unsalted rice. Unsalted pasta. Low-sodium bread. Whole-grain breads and whole-grain pasta. Vegetables Fresh or frozen vegetables. "No salt added" canned vegetables. "No salt added" tomato sauce and paste. Low-sodium or reduced-sodium tomato and vegetable juice. Fruits Fresh, frozen, or canned fruit. Fruit juice. Meats and other protein foods Fresh or frozen (no salt added) meat, poultry, seafood, and fish. Low-sodium canned tuna and salmon. Unsalted nuts. Dried peas, beans, and lentils without added salt. Unsalted canned beans. Eggs. Unsalted nut butters. Dairy Milk. Soy milk. Cheese that is naturally low in sodium, such as ricotta cheese, fresh  mozzarella, or Swiss cheese Low-sodium or reduced-sodium cheese. Cream cheese. Yogurt. Fats and oils Unsalted butter. Unsalted margarine with no trans fat. Vegetable oils such as canola or olive oils. Seasonings and other foods Fresh and dried herbs and spices. Salt-free seasonings. Low-sodium mustard and ketchup. Sodium-free salad dressing. Sodium-free light mayonnaise. Fresh or refrigerated horseradish. Lemon juice. Vinegar. Homemade, reduced-sodium, or low-sodium soups. Unsalted popcorn and pretzels. Low-salt or salt-free chips. What foods are not recommended? The items listed may not be a complete list. Talk with your dietitian about what dietary choices are best for you. Grains Instant hot cereals. Bread stuffing, pancake, and biscuit mixes. Croutons. Seasoned rice or pasta mixes. Noodle soup cups. Boxed or frozen macaroni and cheese. Regular salted crackers. Self-rising flour. Vegetables Sauerkraut, pickled vegetables, and relishes. Olives. [place] fries. Onion rings. Regular canned vegetables (not low-sodium or reduced-sodium). Regular canned tomato sauce and paste (not low-sodium or reduced-sodium). Regular tomato and vegetable juice (not low-sodium or reduced-sodium). Frozen vegetables in sauces. Meats and other protein foods Meat or fish that is salted, canned, smoked, spiced, or pickled. Bacon, ham, sausage,  hotdogs, corned beef, chipped beef, packaged lunch meats, salt pork, jerky, pickled herring, anchovies, regular canned tuna, sardines, salted nuts. Dairy Processed cheese and cheese spreads. Cheese curds. Blue cheese. Feta cheese. String cheese. Regular cottage cheese. Buttermilk. Canned milk. Fats and oils Salted butter. Regular margarine. Ghee. Bacon fat. Seasonings and other foods Onion salt, garlic salt, seasoned salt, table salt, and sea salt. Canned and packaged gravies. Worcestershire sauce. Tartar sauce. Barbecue sauce. Teriyaki sauce. Soy sauce, including reduced-sodium.  Steak sauce. Fish sauce. Oyster sauce. Cocktail sauce. Horseradish that you find on the shelf. Regular ketchup and mustard. Meat flavorings and tenderizers. Bouillon cubes. Hot sauce and Tabasco sauce. Premade or packaged marinades. Premade or packaged taco seasonings. Relishes. Regular salad dressings. Salsa. Potato and tortilla chips. Corn chips and puffs. Salted popcorn and pretzels. Canned or dried soups. Pizza. Frozen entrees and pot pies. Summary  Eating less sodium can help lower your blood pressure, reduce swelling, and protect your heart, liver, and kidneys.  Most people on this plan should limit their sodium intake to 1,500-2,000 mg (milligrams) of sodium each day.  Canned, boxed, and frozen foods are high in sodium. Restaurant foods, fast foods, and pizza are also very high in sodium. You also get sodium by adding salt to food.  Try to cook at home, eat more fresh fruits and vegetables, and eat less fast food, canned, processed, or prepared foods. This information is not intended to replace advice given to you by your health care provider. Make sure you discuss any questions you have with your health care provider. Document Released: 03/10/2002 Document Revised: 09/11/2016 Document Reviewed: 09/11/2016 Elsevier Interactive Patient Education  [employer].

## 2018-03-19 NOTE — Progress Notes (Signed)
    COLPOSCOPY PROCEDURE NOTE  27 y.o. G1P0101 Pregnancy status: not pregnant Indications: ASCUS, +HR HPV on 01/31/18. 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 G1P0101 here for colposcopy for ASCUS with POSITIVE high risk HPV pap smear on 01/31/18. Discussed role for HPV in cervical dysplasia, need for surveillance.  Patient given informed consent, signed copy in the chart, time out was performed. Cervix viewed with speculum and colposcope after application of acetic acid:  Cervix Fully Visualized Squamocolumnar Junction Visibility: Fully visualized  Acetowhite lesions: 5 and 9 o'clock  Other Lesions: None Punctation: Not present  Mosaicism: Not present Abnormal vasculature: No   Biopsies: 5 and 9 o'clock ECC: Yes - Curette and Brush  Hemostasis achieved with:  Monsel's Solution  Colposcopy Impression:  CIN1  Colposcopy adequate? Yes  All specimens were labeled and sent to pathology.   Patient was given post procedure instructions.  Will follow up pathology and manage accordingly; patient will be contacted with results and recommendations.  Routine preventative health maintenance measures emphasized.  Raynelle FanningJulie P. Degele, MD OB Fellow 03/19/2018

## 2018-03-19 NOTE — Telephone Encounter (Signed)
Will have her restart amlodipine at 5mg . Scheduling staff, please set up OV for 2 weeks from today. Thank you.

## 2018-03-19 NOTE — Telephone Encounter (Signed)
Pt reports saw M. Mani 01/31/18 for routine exam. States B/P WNL at that time (118/88) and she reported to PA readings had been "Best she has ever had." States B/P meds were discontinued at that time and she was instructed to call back if trending up and he would restart.  Pt reports had colposcopy today; B/P prior to procedure 179/111, few minutes later 155/109.  States after procedure 171/111. Pt denies any other symptoms "I feel fine." Denies headache, weakness, dizziness. "I just need the PA to know so he can restart my medications." Pt. made aware she may need to be seen. Also instructed pt to ED if headache, one sided weakness, dizziness, dysphagia, speech difficulties occur.  Please advise: [phone]

## 2018-03-19 NOTE — Progress Notes (Signed)
Pt noted to have significantly elevated BP at time of colposcopy. Pt declines any symptoms, including no headache, dizziness, lightheadedness, focal weakness, chest pain or SOB.   She has h/o HTN and was previously on medications, but reports BP had improved and has been off meds. Recommended that she follows up PCP in about a week for BP check. Also discussed precautions for which she would need to go to an ED.   Patty FanningJulie P. Kaeo Jacome, MD OB Fellow

## 2018-04-17 ENCOUNTER — Ambulatory Visit: Payer: BLUE CROSS/BLUE SHIELD | Admitting: Emergency Medicine

## 2018-04-17 ENCOUNTER — Other Ambulatory Visit: Payer: Self-pay

## 2018-04-17 ENCOUNTER — Encounter: Payer: Self-pay | Admitting: Emergency Medicine

## 2018-04-17 VITALS — BP 120/84 | HR 99 | Temp 99.3°F | Resp 16 | Ht 64.0 in | Wt 203.4 lb

## 2018-04-17 DIAGNOSIS — J029 Acute pharyngitis, unspecified: Secondary | ICD-10-CM | POA: Diagnosis not present

## 2018-04-17 DIAGNOSIS — R509 Fever, unspecified: Secondary | ICD-10-CM

## 2018-04-17 LAB — POCT RAPID STREP A (OFFICE): Rapid Strep A Screen: NEGATIVE

## 2018-04-17 MED ORDER — AZITHROMYCIN 250 MG PO TABS: ORAL_TABLET | ORAL | 0 refills | Status: DC

## 2018-04-17 NOTE — Patient Instructions (Addendum)
     IF you received an x-ray today, you will receive an invoice from [facility]. Please contact [facility] at [phone] with questions or concerns regarding your invoice.   IF you received labwork today, you will receive an invoice from [facility]. Please contact [facility] at [phone] with questions or concerns regarding your invoice.   Our billing staff will not be able to assist you with questions regarding bills from these companies.  You will be contacted with the lab results as soon as they are available. The fastest way to get your results is to activate your My Chart account. Instructions are located on the last page of this paperwork. If you have not heard from us regarding the results in 2 weeks, please contact this office.     Sore Throat When you have a sore throat, your throat may:  Hurt.  Burn.  Feel irritated.  Feel scratchy.  Many things can cause a sore throat, including:  An infection.  Allergies.  Dryness in the air.  Smoke or pollution.  Gastroesophageal reflux disease (GERD).  A tumor.  A sore throat can be the first sign of another sickness. It can happen with other problems, like coughing or a fever. Most sore throats go away without treatment. Follow these instructions at home:  Take over-the-counter medicines only as told by your doctor.  Drink enough fluids to keep your pee (urine) clear or pale yellow.  Rest when you feel you need to.  To help with pain, try: ? Sipping warm liquids, such as broth, herbal tea, or warm water. ? Eating or drinking cold or frozen liquids, such as frozen ice pops. ? Gargling with a salt-water mixture 3-4 times a day or as needed. To make a salt-water mixture, add -1 tsp of salt in 1 cup of warm water. Mix it until you cannot see the salt anymore. ? Sucking on hard candy or throat lozenges. ? Putting a cool-mist humidifier in your bedroom at night. ? Sitting in the bathroom with the  door closed for 5-10 minutes while you run hot water in the shower.  Do not use any tobacco products, such as cigarettes, chewing tobacco, and e-cigarettes. If you need help quitting, ask your doctor. Contact a doctor if:  You have a fever for more than 2-3 days.  You keep having symptoms for more than 2-3 days.  Your throat does not get better in 7 days.  You have a fever and your symptoms suddenly get worse. Get help right away if:  You have trouble breathing.  You cannot swallow fluids, soft foods, or your saliva.  You have swelling in your throat or neck that gets worse.  You keep feeling like you are going to throw up (vomit).  You keep throwing up. This information is not intended to replace advice given to you by your health care provider. Make sure you discuss any questions you have with your health care provider. Document Released: 06/27/2008 Document Revised: 05/14/2016 Document Reviewed: 07/09/2015 Elsevier Interactive Patient Education  2018 Elsevier Inc.  

## 2018-04-17 NOTE — Progress Notes (Signed)
Patty Gilmore 26 y.o.   Chief Complaint  Patient presents with  . Sore Throat    per patient woke up this morning unable to swallow and neck hurts    HISTORY OF PRESENT ILLNESS: This is a 27 y.o. female complaining of sore throat and difficulty swallowing that started this morning.  Also complaining of low-grade fever.  No other significant symptoms.  Sore Throat   This is a new problem. The current episode started today. The problem has been gradually worsening. The maximum temperature recorded prior to her arrival was 100.4 - 100.9 F. The fever has been present for less than 1 day. The pain is at a severity of 6/10. The pain is moderate. Associated symptoms include swollen glands and trouble swallowing. Pertinent negatives include no abdominal pain, congestion, coughing, diarrhea, drooling, ear discharge, ear pain, headaches, hoarse voice, neck pain, shortness of breath, stridor or vomiting. She has had no exposure to strep or mono. She has tried nothing for the symptoms.     Prior to Admission medications   Medication Sig Start Date End Date Taking? Authorizing Provider  amLODipine (NORVASC) 5 MG tablet Take 1 tablet (5 mg total) by mouth daily. 03/19/18  Yes Wallis Bamberg, PA-C  SUMAtriptan (IMITREX) 50 MG tablet Take 1 tablet at the onset of a migraine.  If your headache persists after 2 hours, take 1 more dose.  Do not exceed 2 doses in 1 day. 01/31/18  Yes Wallis Bamberg, PA-C  ALPRAZolam Prudy Feeler) 0.5 MG tablet Take 1 tablet (0.5 mg total) by mouth at bedtime as needed for anxiety. Patient not taking: Reported on 03/19/2018 02/09/18   Wallis Bamberg, PA-C    No Known Allergies  Patient Active Problem List   Diagnosis Date Noted  . Lower abdominal pain 01/29/2018  . Acanthosis nigricans 01/16/2012  . LVH (left ventricular hypertrophy) 01/16/2012    Past Medical History:  Diagnosis Date  . 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  Hypertension    was taking amlodipine stopped about 1 yr ago - no insurance - had been on meds since age 17  . Pregnancy induced hypertension     Past Surgical History:  Procedure Laterality Date  . CESAREAN SECTION N/A 05/04/2016   Procedure: CESAREAN SECTION;  Surgeon: Tilda Burrow, MD;  Location: [facility];  Service: Obstetrics;  Laterality: N/A;  . LUMBAR PUNCTURE  07/20/2014   fluid build up around spinal column  . NO PAST SURGERIES      Social History   Socioeconomic History  . Marital status: Single    Spouse name: Not on file  . Number of children: Not on file  . Years of education: Not on file  . Highest education level: Not on file  Occupational History    Employer: [employer]    Comment: [employer]  Social Needs  . Financial resource strain: Not on file  . Food insecurity:    Worry: Not on file    Inability: Not on file  . Transportation needs:    Medical: Not on file    Non-medical: Not on file  Tobacco Use  . Smoking status: Never Smoker  . Smokeless tobacco: Never Used  Substance and Sexual Activity  . Alcohol use: No    Comment: Rarely (less than once per month)  . Drug use: Yes    Types: Marijuana  . Sexual activity: Yes    Birth control/protection:  None  Lifestyle  . Physical activity:    Days per week: Not on file    Minutes per session: Not on file  . Stress: Not on file  Relationships  . Social connections:    Talks on phone: Not on file    Gets together: Not on file    Attends religious service: Not on file    Active member of club or organization: Not on file    Attends meetings of clubs or organizations: Not on file    Relationship status: Not on file  . Intimate partner violence:    Fear of current or ex partner: Not on file    Emotionally abused: Not on file    Physically abused: Not on file    Forced sexual activity: Not on file  Other Topics Concern  . Not on file  Social History Narrative  . Not on file     Family History  Problem Relation Age of Onset  . Diabetes Maternal Grandmother   . Diabetes Paternal Grandmother   . Thyroid disease Mother      Review of Systems  Constitutional: Positive for fever. Negative for malaise/fatigue.  HENT: Positive for sore throat and trouble swallowing. Negative for congestion, drooling, ear discharge, ear pain and hoarse voice.   Eyes: Negative for discharge and redness.  Respiratory: Negative for cough, shortness of breath and stridor.   Cardiovascular: Negative for chest pain and palpitations.  Gastrointestinal: Negative for abdominal pain, diarrhea, nausea and vomiting.  Genitourinary: Negative for dysuria and hematuria.  Musculoskeletal: Negative for neck pain.  Skin: Negative for rash.  Neurological: Negative for dizziness and headaches.  Endo/Heme/Allergies: Negative.   All other systems reviewed and are negative.   Vitals:   04/17/18 1204  BP: 120/84  Pulse: 99  Resp: 16  Temp: 99.3 F (37.4 C)  SpO2: 99%    Physical Exam  Constitutional: She is oriented to person, place, and time. She appears well-developed and well-nourished.  HENT:  Head: Normocephalic and atraumatic.  Right Ear: External ear normal.  Left Ear: External ear normal.  Mouth/Throat: Uvula is midline. No oral lesions. No uvula swelling. Oropharyngeal exudate, posterior oropharyngeal edema and posterior oropharyngeal erythema present. No tonsillar abscesses.  Eyes: Pupils are equal, round, and reactive to light. Conjunctivae and EOM are normal.  Neck: Normal range of motion. Neck supple. No thyromegaly present.  Cardiovascular: Normal rate, regular rhythm and normal heart sounds.  Pulmonary/Chest: Effort normal and breath sounds normal.  Musculoskeletal: Normal range of motion. She exhibits no edema.  Lymphadenopathy:    She has cervical adenopathy.  Neurological: She is alert and oriented to person, place, and time. No sensory deficit. She exhibits normal  muscle tone.  Skin: Skin is warm and dry. Capillary refill takes less than 2 seconds.  Psychiatric: She has a normal mood and affect. Her behavior is normal.  Vitals reviewed.   Results for orders placed or performed in visit on 04/17/18 (from the past 24 hour(s))  POCT rapid strep A     Status: None   Collection Time: 04/17/18 12:22 PM  Result Value Ref Range   Rapid Strep A Screen Negative Negative   A total of 25 minutes was spent in the room with the patient, greater than 50% of which was in counseling/coordination of care regarding differential diagnosis, treatment, medications, test results and need for follow-up if no better or worse.  ASSESSMENT & PLAN: Patty Gilmore was seen today for sore throat.  Diagnoses and all  orders for this visit:  Sorethroat -     POCT rapid strep A -     Culture, Group A Strep  Acute pharyngitis, unspecified etiology -     azithromycin (ZITHROMAX) 250 MG tablet; Sig as indicated    Patient Instructions       IF you received an x-ray today, you will receive an invoice from [facility]. Please contact [facility] at [phone] with questions or concerns regarding your invoice.   IF you received labwork today, you will receive an invoice from [facility]. Please contact [facility] at [phone] with questions or concerns regarding your invoice.   Our billing staff will not be able to assist you with questions regarding bills from these companies.  You will be contacted with the lab results as soon as they are available. The fastest way to get your results is to activate your My Chart account. Instructions are located on the last page of this paperwork. If you have not heard from [place] regarding the results in 2 weeks, please contact this office.     Sore Throat When you have a sore throat, your throat may:  Hurt.  Burn.  Feel irritated.  Feel scratchy.  Many things can cause a sore throat, including:  An  infection.  Allergies.  Dryness in the air.  Smoke or pollution.  Gastroesophageal reflux disease (GERD).  A tumor.  A sore throat can be the first sign of another sickness. It can happen with other problems, like coughing or a fever. Most sore throats go away without treatment. Follow these instructions at home:  Take over-the-counter medicines only as told by your doctor.  Drink enough fluids to keep your pee (urine) clear or pale yellow.  Rest when you feel you need to.  To help with pain, try: ? Sipping warm liquids, such as broth, herbal tea, or warm water. ? Eating or drinking cold or frozen liquids, such as frozen ice pops. ? Gargling with a salt-water mixture 3-4 times a day or as needed. To make a salt-water mixture, add -1 tsp of salt in 1 cup of warm water. Mix it until you cannot see the salt anymore. ? Sucking on hard candy or throat lozenges. ? Putting a cool-mist humidifier in your bedroom at night. ? Sitting in the bathroom with the door closed for 5-10 minutes while you run hot water in the shower.  Do not use any tobacco products, such as cigarettes, chewing tobacco, and e-cigarettes. If you need help quitting, ask your doctor. Contact a doctor if:  You have a fever for more than 2-3 days.  You keep having symptoms for more than 2-3 days.  Your throat does not get better in 7 days.  You have a fever and your symptoms suddenly get worse. Get help right away if:  You have trouble breathing.  You cannot swallow fluids, soft foods, or your saliva.  You have swelling in your throat or neck that gets worse.  You keep feeling like you are going to throw up (vomit).  You keep throwing up. This information is not intended to replace advice given to you by your health care provider. Make sure you discuss any questions you have with your health care provider. Document Released: 06/27/2008 Document Revised: 05/14/2016 Document Reviewed: 07/09/2015 Elsevier  Interactive Patient Education  2018 Elsevier Inc.      Edwina Barth, MD [facility]

## 2018-04-19 ENCOUNTER — Encounter: Payer: Self-pay | Admitting: Emergency Medicine

## 2018-04-19 LAB — CULTURE, GROUP A STREP: Strep A Culture: POSITIVE — AB

## 2018-11-04 ENCOUNTER — Other Ambulatory Visit: Payer: Self-pay | Admitting: Urgent Care

## 2018-11-08 ENCOUNTER — Ambulatory Visit: Payer: BLUE CROSS/BLUE SHIELD | Admitting: Family Medicine

## 2018-11-08 ENCOUNTER — Encounter: Payer: Self-pay | Admitting: Family Medicine

## 2018-11-08 ENCOUNTER — Other Ambulatory Visit: Payer: Self-pay

## 2018-11-08 VITALS — BP 138/85 | HR 72 | Temp 98.2°F | Ht 63.0 in | Wt 207.0 lb

## 2018-11-08 DIAGNOSIS — I10 Essential (primary) hypertension: Secondary | ICD-10-CM

## 2018-11-08 DIAGNOSIS — Z23 Encounter for immunization: Secondary | ICD-10-CM | POA: Diagnosis not present

## 2018-11-08 MED ORDER — AMLODIPINE BESYLATE 5 MG PO TABS: 5.0000 mg | ORAL_TABLET | Freq: Every day | ORAL | 1 refills | Status: DC

## 2018-11-08 NOTE — Progress Notes (Signed)
2/7/20205:27 PM  Patty Gilmore 08-Sep-1991, 28 y.o. female [id number]  Chief Complaint  Patient presents with  . Hypertension    Needs refill son Norvasc    HPI:   Patient is a 28 y.o. female with past medical history significant for HTN, abnormal pap, migraines who presents today for routine followup  Pap followed by gyn, sees this June Has had flu vaccine this season On nexplanon  Tries to follow low salt diet   Fall Risk  11/08/2018 04/17/2018 01/31/2018 01/29/2018 04/03/2016  Falls in the past year? 0 No No No No  Number falls in past yr: 0 - - - -  Injury with Fall? 0 - - - -     Depression screen [facility] 2/9 11/08/2018 04/17/2018 03/19/2018  Decreased Interest 0 0 0  Down, Depressed, Hopeless 0 0 1  PHQ - 2 Score 0 0 1  Altered sleeping - - 0  Tired, decreased energy - - 1  Change in appetite - - 0  Feeling bad or failure about yourself  - - 1  Trouble concentrating - - 0  Moving slowly or fidgety/restless - - 0  Suicidal thoughts - - 0  PHQ-9 Score - - 3    No Known Allergies  Prior to Admission medications   Medication Sig Start Date End Date Taking? Authorizing Provider  ALPRAZolam Duanne Moron) 0.5 MG tablet Take 1 tablet (0.5 mg total) by mouth at bedtime as needed for anxiety. 02/09/18  Yes Jaynee Eagles, PA-C  amLODipine (NORVASC) 5 MG tablet Take 1 tablet (5 mg total) by mouth daily. 03/19/18  Yes Jaynee Eagles, PA-C  SUMAtriptan (IMITREX) 50 MG tablet Take 1 tablet at the onset of a migraine.  If your headache persists after 2 hours, take 1 more dose.  Do not exceed 2 doses in 1 day. 01/31/18  Yes Jaynee Eagles, PA-C    Past Medical History:  Diagnosis Date  . 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 Hypertension    was taking amlodipine stopped about 1 yr ago - no insurance - had been on meds since age 4  . Pregnancy induced hypertension     Past Surgical History:  Procedure Laterality Date  . CESAREAN SECTION N/A  05/04/2016   Procedure: CESAREAN SECTION;  Surgeon: Jonnie Kind, MD;  Location: [place];  Service: Obstetrics;  Laterality: N/A;  . LUMBAR PUNCTURE  07/20/2014   fluid build up around spinal column  . NO PAST SURGERIES      Social History   Tobacco Use  . Smoking status: Never Smoker  . Smokeless tobacco: Never Used  Substance Use Topics  . Alcohol use: No    Comment: Rarely (less than once per month)    Family History  Problem Relation Age of Onset  . Diabetes Maternal Grandmother   . Diabetes Paternal Grandmother   . Thyroid disease Mother     Review of Systems  Constitutional: Negative for chills and fever.  Respiratory: Negative for cough and shortness of breath.   Cardiovascular: Negative for chest pain, palpitations and leg swelling.  Gastrointestinal: Negative for abdominal pain, nausea and vomiting.     OBJECTIVE:  Blood pressure 138/85, pulse 72, temperature 98.2 F (36.8 C), temperature source Oral, height '5\' 3"'  (1.6 m), weight 207 lb (93.9 kg), last menstrual period 10/22/2018, SpO2 100 %, currently breastfeeding. Body mass index is 36.67 kg/m.   Physical Exam Vitals signs and  nursing note reviewed.  Constitutional:      Appearance: She is well-developed.  HENT:     Head: Normocephalic and atraumatic.     Mouth/Throat:     Pharynx: No oropharyngeal exudate.  Eyes:     General: No scleral icterus.    Conjunctiva/sclera: Conjunctivae normal.     Pupils: Pupils are equal, round, and reactive to light.  Neck:     Musculoskeletal: Neck supple.  Cardiovascular:     Rate and Rhythm: Normal rate and regular rhythm.     Heart sounds: Normal heart sounds. No murmur. No friction rub. No gallop.   Pulmonary:     Effort: Pulmonary effort is normal.     Breath sounds: Normal breath sounds. No wheezing or rales.  Lymphadenopathy:     Cervical: No cervical adenopathy.  Skin:    General: Skin is warm and dry.  Neurological:     Mental Status: She  is alert and oriented to person, place, and time.     ASSESSMENT and PLAN  1. Essential hypertension Controlled. Continue current regime.  - Lipid panel - TSH - CMP14+EGFR  2. Need for prophylactic vaccination and inoculation against influenza - Flu Vaccine QUAD 36+ mos IM    Return in about 6 months (around 05/09/2019).    Rutherford Guys, MD [facility] [place], [place] [address] Ph.  [phone] Fax [phone]

## 2018-11-08 NOTE — Patient Instructions (Signed)
° ° ° °  If you have lab work done today you will be contacted with your lab results within the next 2 weeks.  If you have not heard from us then please contact us. The fastest way to get your results is to register for My Chart. ° ° °IF you received an x-ray today, you will receive an invoice from [facility]. Please contact [facility] at [phone] with questions or concerns regarding your invoice.  ° °IF you received labwork today, you will receive an invoice from [facility]. Please contact [facility] at [phone] with questions or concerns regarding your invoice.  ° °Our billing staff will not be able to assist you with questions regarding bills from these companies. ° °You will be contacted with the lab results as soon as they are available. The fastest way to get your results is to activate your My Chart account. Instructions are located on the last page of this paperwork. If you have not heard from us regarding the results in 2 weeks, please contact this office. °  ° ° ° °

## 2018-11-09 LAB — CMP14+EGFR
ALT: 15 IU/L (ref 0–32)
AST: 23 IU/L (ref 0–40)
Albumin/Globulin Ratio: 1.5 (ref 1.2–2.2)
Albumin: 4.2 g/dL (ref 3.9–5.0)
Alkaline Phosphatase: 75 IU/L (ref 39–117)
BUN/Creatinine Ratio: 11 (ref 9–23)
BUN: 14 mg/dL (ref 6–20)
Bilirubin Total: <0.2 mg/dL (ref 0.0–1.2)
CO2: 20 mmol/L (ref 20–29)
Calcium: 9.1 mg/dL (ref 8.7–10.2)
Chloride: 105 mmol/L (ref 96–106)
Creatinine, Ser: 1.24 mg/dL — ABNORMAL HIGH (ref 0.57–1.00)
GFR calc Af Amer: 69 mL/min/{1.73_m2} (ref >59)
GFR calc non Af Amer: 60 mL/min/{1.73_m2} (ref >59)
Globulin, Total: 2.8 g/dL (ref 1.5–4.5)
Glucose: 78 mg/dL (ref 65–99)
Potassium: 5.1 mmol/L (ref 3.5–5.2)
Sodium: 138 mmol/L (ref 134–144)
Total Protein: 7 g/dL (ref 6.0–8.5)

## 2018-11-09 LAB — LIPID PANEL
Chol/HDL Ratio: 2.5 ratio (ref 0.0–4.4)
Cholesterol, Total: 132 mg/dL (ref 100–199)
HDL: 52 mg/dL (ref >39)
LDL Calculated: 66 mg/dL (ref 0–99)
Triglycerides: 71 mg/dL (ref 0–149)
VLDL Cholesterol Cal: 14 mg/dL (ref 5–40)

## 2018-11-09 LAB — TSH: TSH: 0.865 u[IU]/mL (ref 0.450–4.500)

## 2019-05-18 ENCOUNTER — Other Ambulatory Visit: Payer: Self-pay | Admitting: Family Medicine

## 2019-05-19 ENCOUNTER — Encounter: Payer: BLUE CROSS/BLUE SHIELD | Admitting: Registered Nurse

## 2019-05-26 ENCOUNTER — Encounter: Payer: BC Managed Care – PPO | Admitting: Registered Nurse

## 2019-07-11 ENCOUNTER — Encounter: Payer: Self-pay | Admitting: Obstetrics & Gynecology

## 2019-07-11 ENCOUNTER — Other Ambulatory Visit: Payer: Self-pay

## 2019-07-11 ENCOUNTER — Ambulatory Visit (INDEPENDENT_AMBULATORY_CARE_PROVIDER_SITE_OTHER): Payer: BC Managed Care – PPO | Admitting: Obstetrics & Gynecology

## 2019-07-11 VITALS — BP 141/102 | HR 89 | Wt 199.0 lb

## 2019-07-11 DIAGNOSIS — Z3046 Encounter for surveillance of implantable subdermal contraceptive: Secondary | ICD-10-CM

## 2019-07-11 DIAGNOSIS — Z124 Encounter for screening for malignant neoplasm of cervix: Secondary | ICD-10-CM

## 2019-07-11 DIAGNOSIS — N870 Mild cervical dysplasia: Secondary | ICD-10-CM

## 2019-07-11 DIAGNOSIS — Z1151 Encounter for screening for human papillomavirus (HPV): Secondary | ICD-10-CM

## 2019-07-11 MED ORDER — ETONOGESTREL 68 MG ~~LOC~~ IMPL: 68.0000 mg | DRUG_IMPLANT | Freq: Once | SUBCUTANEOUS | Status: DC

## 2019-07-11 NOTE — Progress Notes (Signed)
    GYNECOLOGY OFFICE PROCEDURE NOTE  Patty Gilmore is a 28 y.o. G1P0101 here for Nexplanon removal and Nexplanon insertion.  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   No other gynecologic concerns.  Nexplanon Removal and Insertion  Patient identified, informed consent performed, consent signed.   Patient does understand that irregular bleeding is a very common side effect of this medication. She was advised to have backup contraception for one week after replacement of the implant. Pregnancy test in clinic today was negative.  Appropriate time out taken. Implanon site identified. Area prepped in usual sterile fashon. One ml of 1% lidocaine was used to anesthetize the area at the distal end of the implant. A small stab incision was made right beside the implant on the distal portion. The Nexplanon rod was grasped using hemostats and removed without difficulty. There was minimal blood loss. There were no complications. Area was then injected with 3 ml of 1 % lidocaine. She was re-prepped with betadine, Nexplanon removed from packaging, Device confirmed in needle, then inserted full length of needle and withdrawn per handbook instructions. Nexplanon was able to palpated in the patient's arm; patient palpated the insert herself.  There was minimal blood loss. Patient insertion site covered with guaze and a pressure bandage to reduce any bruising. The patient tolerated the procedure well and was given post procedure instructions.  She was advised to have backup contraception for one week.    Patient is also due for repeat for repeat pap smear given abnormal pap/CIN I last year, this was repeated today.  Will follow up results and manage accordingly.  Routine post-Nexplanon instructions given to patient.  Verita Schneiders, MD, [place] [facility] [employer], [place]

## 2019-07-11 NOTE — Patient Instructions (Signed)
Nexplanon Instructions After Insertion  Keep bandage clean and dry for 24 hours  May use ice/Tylenol/Ibuprofen for soreness or pain  If you develop fever, drainage or increased warmth from incision site-contact office immediately   

## 2019-07-13 ENCOUNTER — Encounter: Payer: Self-pay | Admitting: Obstetrics & Gynecology

## 2019-07-22 ENCOUNTER — Encounter: Payer: Self-pay | Admitting: Registered Nurse

## 2019-07-22 ENCOUNTER — Emergency Department (HOSPITAL_COMMUNITY): Payer: BC Managed Care – PPO

## 2019-07-22 ENCOUNTER — Emergency Department (HOSPITAL_COMMUNITY)
Admission: EM | Admit: 2019-07-22 | Discharge: 2019-07-22 | Disposition: A | Discharge status: Home / Self Care | Payer: BC Managed Care – PPO | Attending: Emergency Medicine | Admitting: Emergency Medicine

## 2019-07-22 ENCOUNTER — Ambulatory Visit (INDEPENDENT_AMBULATORY_CARE_PROVIDER_SITE_OTHER): Payer: BC Managed Care – PPO | Admitting: Registered Nurse

## 2019-07-22 ENCOUNTER — Other Ambulatory Visit: Payer: Self-pay

## 2019-07-22 ENCOUNTER — Encounter (HOSPITAL_COMMUNITY): Payer: Self-pay

## 2019-07-22 VITALS — BP 155/100 | HR 76 | Temp 99.0°F | Resp 16 | Wt 199.0 lb

## 2019-07-22 DIAGNOSIS — X500XXA Overexertion from strenuous movement or load, initial encounter: Secondary | ICD-10-CM | POA: Insufficient documentation

## 2019-07-22 DIAGNOSIS — S43004A Unspecified dislocation of right shoulder joint, initial encounter: Secondary | ICD-10-CM | POA: Insufficient documentation

## 2019-07-22 DIAGNOSIS — Y939 Activity, unspecified: Secondary | ICD-10-CM | POA: Insufficient documentation

## 2019-07-22 DIAGNOSIS — S43014A Anterior dislocation of right humerus, initial encounter: Secondary | ICD-10-CM | POA: Diagnosis not present

## 2019-07-22 DIAGNOSIS — Y999 Unspecified external cause status: Secondary | ICD-10-CM | POA: Diagnosis not present

## 2019-07-22 DIAGNOSIS — M24411 Recurrent dislocation, right shoulder: Secondary | ICD-10-CM | POA: Diagnosis not present

## 2019-07-22 DIAGNOSIS — I10 Essential (primary) hypertension: Secondary | ICD-10-CM | POA: Insufficient documentation

## 2019-07-22 DIAGNOSIS — Y929 Unspecified place or not applicable: Secondary | ICD-10-CM | POA: Diagnosis not present

## 2019-07-22 DIAGNOSIS — S43014D Anterior dislocation of right humerus, subsequent encounter: Secondary | ICD-10-CM | POA: Diagnosis not present

## 2019-07-22 LAB — CYTOLOGY - PAP
Comment: NEGATIVE
Diagnosis: NEGATIVE
High risk HPV: NEGATIVE

## 2019-07-22 MED ORDER — FENTANYL CITRATE (PF) 100 MCG/2ML IJ SOLN
100.0000 ug | Freq: Once | INTRAMUSCULAR | Status: AC
  Administered 2019-07-22: 100 ug via INTRAVENOUS
  Filled 2019-07-22: qty 2

## 2019-07-22 MED ORDER — LIDOCAINE-EPINEPHRINE (PF) 2 %-1:200000 IJ SOLN
20.0000 mL | Freq: Once | INTRAMUSCULAR | Status: AC
  Administered 2019-07-22: 20 mL via INTRADERMAL
  Filled 2019-07-22: qty 20

## 2019-07-22 MED ORDER — FENTANYL CITRATE (PF) 100 MCG/2ML IJ SOLN
100.0000 ug | Freq: Once | INTRAMUSCULAR | Status: AC
  Administered 2019-07-22: 10:00:00 100 ug via INTRAVENOUS
  Filled 2019-07-22: qty 2

## 2019-07-22 NOTE — ED Notes (Signed)
Patient verbalizes understanding of discharge instructions. Opportunity for questioning and answers were provided. Pt discharged from ED. 

## 2019-07-22 NOTE — Patient Instructions (Signed)
° ° ° °  If you have lab work done today you will be contacted with your lab results within the next 2 weeks.  If you have not heard from us then please contact us. The fastest way to get your results is to register for My Chart. ° ° °IF you received an x-ray today, you will receive an invoice from [facility]. Please contact [facility] at [phone] with questions or concerns regarding your invoice.  ° °IF you received labwork today, you will receive an invoice from [facility]. Please contact [facility] at [phone] with questions or concerns regarding your invoice.  ° °Our billing staff will not be able to assist you with questions regarding bills from these companies. ° °You will be contacted with the lab results as soon as they are available. The fastest way to get your results is to activate your My Chart account. Instructions are located on the last page of this paperwork. If you have not heard from us regarding the results in 2 weeks, please contact this office. °  ° ° ° °

## 2019-07-22 NOTE — ED Notes (Addendum)
Patient transported to x-ray. ?

## 2019-07-22 NOTE — Progress Notes (Signed)
Acute Office Visit  Subjective:    Patient ID: Patty Gilmore, female    DOB: 08/21/1991, 28 y.o.   MRN: [id number]  Chief Complaint  Patient presents with  . Shoulder Pain    right shoulder pain pt states she think she dislocated her shoulder    HPI Patient is in today for R shoulder pain  Notes that she was swatting at a fly this morning when she noted abrupt and acute pain in her R shoulder. She proceeded to call our office and come in for an appt. She is concerned that she dislocated her shoulder - she has done this in the past,  Dec 2018, and pain is the same.   She understands that we do not have sedation in this office and as such, attempting reduction of the joint is much less likely to be successful. She also understands that failure in reduction will warrant sending her to the ED for management.  Reports pain as joint line, anterior. Denies pain in upper arm and neck. Denies numbness, tingling, or weakness in right arm. Denies shooting pain down arm. Has no ROM in arm.  Past Medical History:  Diagnosis Date  . 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 Hypertension    was taking amlodipine stopped about 1 yr ago - no insurance - had been on meds since age 56  . Pregnancy induced hypertension     Past Surgical History:  Procedure Laterality Date  . CESAREAN SECTION N/A 05/04/2016   Procedure: CESAREAN SECTION;  Surgeon: Jonnie Kind, MD;  Location: [facility];  Service: Obstetrics;  Laterality: N/A;  . LUMBAR PUNCTURE  07/20/2014   fluid build up around spinal column  . NO PAST SURGERIES      Family History  Problem Relation Age of Onset  . Diabetes Maternal Grandmother   . Diabetes Paternal Grandmother   . Thyroid disease Mother     Social History   Socioeconomic History  . Marital status: Single    Spouse name: Not on file  . Number of children: Not on file  . Years of education: Not on file   . Highest education level: Not on file  Occupational History    Employer: [employer]    Comment: [employer]  Social Needs  . Financial resource strain: Not on file  . Food insecurity    Worry: Not on file    Inability: Not on file  . Transportation needs    Medical: Not on file    Non-medical: Not on file  Tobacco Use  . Smoking status: Never Smoker  . Smokeless tobacco: Never Used  Substance and Sexual Activity  . Alcohol use: No    Comment: Rarely (less than once per month)  . Drug use: Yes    Types: Marijuana  . Sexual activity: Yes    Birth control/protection: None  Lifestyle  . Physical activity    Days per week: Not on file    Minutes per session: Not on file  . Stress: Not on file  Relationships  . Social Herbalist on phone: Not on file    Gets together: Not on file    Attends religious service: Not on file    Active member of club or organization: Not on file    Attends meetings of clubs or organizations: Not on file    Relationship status: Not on file  .  Intimate partner violence    Fear of current or ex partner: Not on file    Emotionally abused: Not on file    Physically abused: Not on file    Forced sexual activity: Not on file  Other Topics Concern  . Not on file  Social History Narrative  . Not on file    Outpatient Medications Prior to Visit  Medication Sig Dispense Refill  . amLODipine (NORVASC) 5 MG tablet TAKE 1 TABLET(5 MG) BY MOUTH DAILY 30 tablet 0   Facility-Administered Medications Prior to Visit  Medication Dose Route Frequency Provider Last Rate Last Dose  . etonogestrel (NEXPLANON) implant 68 mg  68 mg Subdermal Once Anyanwu, Ugonna A, MD        No Known Allergies  Review of Systems  Constitutional: Negative.   HENT: Negative.   Eyes: Negative.   Respiratory: Negative.   Cardiovascular: Negative.   Gastrointestinal: Negative.   Genitourinary: Negative.   Musculoskeletal: Positive for joint pain (R shoulder).  Negative for back pain, falls, myalgias and neck pain.  Skin: Negative.   Neurological: Negative.   Endo/Heme/Allergies: Negative.   Psychiatric/Behavioral: Negative.   All other systems reviewed and are negative.      Objective:    Physical Exam  Constitutional: She is oriented to person, place, and time. She appears well-developed and well-nourished. She appears distressed.  Cardiovascular: Normal rate and regular rhythm.  Pulmonary/Chest: Effort normal. No respiratory distress.  Musculoskeletal:     Right shoulder: She exhibits decreased range of motion, tenderness, bony tenderness and swelling.  Neurological: She is alert and oriented to person, place, and time.  Skin: Skin is warm. No rash noted. She is diaphoretic. No erythema. No pallor.  Psychiatric: Her behavior is normal. Judgment and thought content normal. Her mood appears anxious. Her affect is blunt. Her speech is rapid and/or pressured. Cognition and memory are normal.  Nursing note and vitals reviewed.   BP (!) 155/100   Pulse 76   Temp 99 F (37.2 C) (Oral)   Resp 16   Wt 199 lb (90.3 kg)   LMP 07/07/2019 (Exact Date)   SpO2 98%   BMI 35.25 kg/m  Wt Readings from Last 3 Encounters:  07/22/19 199 lb (90.3 kg)  07/11/19 199 lb (90.3 kg)  11/08/18 207 lb (93.9 kg)    Health Maintenance Due  Topic Date Due  . INFLUENZA VACCINE  05/03/2019    There are no preventive care reminders to display for this patient.   Lab Results  Component Value Date   TSH 0.865 11/08/2018   Lab Results  Component Value Date   WBC 4.7 01/31/2018   HGB 10.6 (L) 01/31/2018   HCT 31.2 (L) 01/31/2018   MCV 89 01/31/2018   PLT 324 01/31/2018   Lab Results  Component Value Date   NA 138 11/08/2018   K 5.1 11/08/2018   CO2 20 11/08/2018   GLUCOSE 78 11/08/2018   BUN 14 11/08/2018   CREATININE 1.24 (H) 11/08/2018   BILITOT <0.2 11/08/2018   ALKPHOS 75 11/08/2018   AST 23 11/08/2018   ALT 15 11/08/2018   PROT 7.0  11/08/2018   ALBUMIN 4.2 11/08/2018   CALCIUM 9.1 11/08/2018   ANIONGAP 7 05/07/2016   Lab Results  Component Value Date   CHOL 132 11/08/2018   Lab Results  Component Value Date   HDL 52 11/08/2018   Lab Results  Component Value Date   LDLCALC 66 11/08/2018   Lab Results  Component Value Date   TRIG 71 11/08/2018   Lab Results  Component Value Date   CHOLHDL 2.5 11/08/2018   Lab Results  Component Value Date   HGBA1C 5.6 01/26/2014       Assessment & Plan:   Problem List Items Addressed This Visit    None    Visit Diagnoses    Recurrent dislocation of right shoulder    -  Primary       No orders of the defined types were placed in this encounter.  PLAN  Brief attempt at reduction through external rotation and flexion of joint, unsuccessful. Pt resisting motion as anticipated due to pain and no sedation available.   Strongly suggest patient proceed to ED for reduction and further management, welcome to follow up at our office as needed  Patient encouraged to call clinic with any questions, comments, or concerns.    Janeece Agee, NP

## 2019-07-22 NOTE — Discharge Instructions (Signed)
Take 4 over the counter ibuprofen tablets 3 times a day or 2 over-the-counter naproxen tablets twice a day for pain. Also take tylenol 1000mg (2 extra strength) four times a day.    Follow up with ortho in the office.  With recurrent dislocation you may need surgery to repair.

## 2019-07-22 NOTE — ED Provider Notes (Signed)
[facility] Provider Note   CSN: [id number] Arrival date & time: 07/22/19  [id number]     History   Chief Complaint Chief Complaint  Patient presents with  . Shoulder Injury    HPI Patty Gilmore is a 28 y.o. female.     28 yo F with a chief complaints of right shoulder pain.  The patient states that she was trying to swat a bug for her daughter and she felt like her shoulder dislocated.  She had it dislocated just one time previously.  She denies other injury.  Went to urgent care and there was an attempted closed reduction without any imaging or medications.  This was unsuccessful and she was sent here for evaluation.  The history is provided by the patient.  Shoulder Injury This is a recurrent problem. The current episode started 3 to 5 hours ago. The problem occurs constantly. The problem has not changed since onset.Pertinent negatives include no chest pain, no abdominal pain, no headaches and no shortness of breath. The symptoms are aggravated by bending and twisting. Nothing relieves the symptoms. She has tried nothing for the symptoms. The treatment provided no relief.    Past Medical History:  Diagnosis Date  . 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 Hypertension    was taking amlodipine stopped about 1 yr ago - no insurance - had been on meds since age 28  . Pregnancy induced hypertension     Patient Active Problem List   Diagnosis Date Noted  . Sorethroat 04/17/2018  . Acute pharyngitis 04/17/2018  . Lower abdominal pain 01/29/2018  . Acanthosis nigricans 01/16/2012  . LVH (left ventricular hypertrophy) 01/16/2012    Past Surgical History:  Procedure Laterality Date  . CESAREAN SECTION N/A 05/04/2016   Procedure: CESAREAN SECTION;  Surgeon: Tilda BurrowJohn V Ferguson, MD;  [facility];  Service: Obstetrics;  Laterality: N/A;  . LUMBAR PUNCTURE  07/20/2014   fluid  build up around spinal column  . NO PAST SURGERIES       OB History    Gravida  1   Para  1   Term  0   Preterm  1   AB  0   Living  1     SAB  0   TAB  0   Ectopic  0   Multiple  0   Live Births  1            Home Medications    Prior to Admission medications   Medication Sig Start Date End Date Taking? Authorizing Provider  amLODipine (NORVASC) 5 MG tablet TAKE 1 TABLET(5 MG) BY MOUTH DAILY Patient taking differently: Take 5 mg by mouth daily.  05/19/19  Yes Myles LippsSantiago, Irma M, MD    Family History Family History  Problem Relation Age of Onset  . Diabetes Maternal Grandmother   . Diabetes Paternal Grandmother   . Thyroid disease Mother     Social History Social History   Tobacco Use  . Smoking status: Never Smoker  . Smokeless tobacco: Never Used  Substance Use Topics  . Alcohol use: No    Comment: Rarely (less than once per month)  . Drug use: Yes    Types: Marijuana     Allergies   Patient has no known allergies.   Review of Systems Review of Systems  Constitutional: Negative for chills and fever.  HENT: Negative for  congestion and rhinorrhea.   Eyes: Negative for redness and visual disturbance.  Respiratory: Negative for shortness of breath and wheezing.   Cardiovascular: Negative for chest pain and palpitations.  Gastrointestinal: Negative for abdominal pain, nausea and vomiting.  Genitourinary: Negative for dysuria and urgency.  Musculoskeletal: Positive for arthralgias. Negative for myalgias.  Skin: Negative for pallor and wound.  Neurological: Negative for dizziness and headaches.     Physical Exam Updated Vital Signs BP (!) 159/96   Pulse 72   Temp 98.1 F (36.7 C) (Oral)   Resp 18   LMP 07/07/2019 (Exact Date)   SpO2 100%   Physical Exam Vitals signs and nursing note reviewed.  Constitutional:      General: She is not in acute distress.    Appearance: She is well-developed. She is not diaphoretic.  HENT:      Head: Normocephalic and atraumatic.  Eyes:     Pupils: Pupils are equal, round, and reactive to light.  Neck:     Musculoskeletal: Normal range of motion and neck supple.  Cardiovascular:     Rate and Rhythm: Normal rate and regular rhythm.     Heart sounds: No murmur. No friction rub. No gallop.   Pulmonary:     Effort: Pulmonary effort is normal.     Breath sounds: No wheezing or rales.  Abdominal:     General: There is no distension.     Palpations: Abdomen is soft.     Tenderness: There is no abdominal tenderness.  Musculoskeletal:        General: Tenderness and deformity present.     Comments: Deformity to the right shoulder.  Pulse motor and sensation are intact distally.  Limited range of motion due to pain.  Skin:    General: Skin is warm and dry.  Neurological:     Mental Status: She is alert and oriented to person, place, and time.  Psychiatric:        Behavior: Behavior normal.      ED Treatments / Results  Labs (all labs ordered are listed, but only abnormal results are displayed) Labs Reviewed - No data to display  EKG None  Radiology Dg Shoulder Right  Result Date: 07/22/2019 CLINICAL DATA:  Recent fall with likely dislocation EXAM: RIGHT SHOULDER - 2+ VIEW COMPARISON:  None. FINDINGS: Anterior inferior dislocation of the humeral head is noted. No underlying fracture is seen. No soft tissue abnormality is noted. IMPRESSION: Anterior inferior dislocation of the humeral head. Electronically Signed   By: Inez Catalina M.D.   On: 07/22/2019 10:52    Procedures .Joint Aspiration/Arthrocentesis  Date/Time: 07/22/2019 11:39 AM Performed by: Deno Etienne, DO Authorized by: Deno Etienne, DO   Consent:    Consent obtained:  Verbal   Consent given by:  Patient   Risks discussed:  Bleeding, infection, incomplete drainage and nerve damage   Alternatives discussed:  No treatment Location:    Location:  Shoulder   Shoulder:  R glenohumeral Anesthesia (see MAR for  exact dosages):    Anesthesia method:  Local infiltration   Local anesthetic:  Lidocaine 2% WITH epi Procedure details:    Preparation: Patient was prepped and draped in usual sterile fashion     Needle gauge:  64 G   Ultrasound guidance: no     Approach:  Lateral   Aspirate amount:  0   Steroid injected: no     Specimen collected: no   Post-procedure details:    Dressing:  Adhesive bandage  Patient tolerance of procedure:  Tolerated well, no immediate complications Reduction of dislocation  Date/Time: 07/22/2019 11:40 AM Performed by: Melene Plan, DO Authorized by: Melene Plan, DO  Consent: Verbal consent obtained. Risks and benefits: risks, benefits and alternatives were discussed Consent given by: patient Patient understanding: patient states understanding of the procedure being performed Patient consent: the patient's understanding of the procedure matches consent given Procedure consent: procedure consent matches procedure scheduled Imaging studies: imaging studies available Required items: required blood products, implants, devices, and special equipment available Patient identity confirmed: verbally with patient Time out: Immediately prior to procedure a "time out" was called to verify the correct patient, procedure, equipment, support staff and site/side marked as required. Local anesthesia used: yes Anesthesia: local infiltration  Anesthesia: Local anesthesia used: yes Local Anesthetic: lidocaine 2% with epinephrine Anesthetic total: 10 mL  Sedation: Patient sedated: no  Patient tolerance: patient tolerated the procedure well with no immediate complications    (including critical care time)  Medications Ordered in ED Medications  fentaNYL (SUBLIMAZE) injection 100 mcg (100 mcg Intravenous Given 07/22/19 1027)  lidocaine-EPINEPHrine (XYLOCAINE W/EPI) 2 %-1:200000 (PF) injection 20 mL (20 mLs Intradermal Given 07/22/19 1056)  fentaNYL (SUBLIMAZE) injection 100  mcg (100 mcg Intravenous Given 07/22/19 1107)     Initial Impression / Assessment and Plan / ED Course  I have reviewed the triage vital signs and the nursing notes.  Pertinent labs & imaging results that were available during my care of the patient were reviewed by me and considered in my medical decision making (see chart for details).        28 yo F with a chief complaints of a right shoulder dislocation.  Patient has a history of dislocating this in the past.  Plain film viewed by me with obvious dislocation.  Patient with some pain improvement with IV pain medicine and a joint injection of lidocaine.  Palpable clunk with improved pain.  Repeat films viewed by me with successful relocation.  Placed in a sling.  Orthopedic follow-up.  11:42 AM:  I have discussed the diagnosis/risks/treatment options with the patient and believe the pt to be eligible for discharge home to follow-up with Ortho. We also discussed returning to the ED immediately if new or worsening sx occur. We discussed the sx which are most concerning (e.g., sudden worsening pain, fever, inability to tolerate by mouth) that necessitate immediate return. Medications administered to the patient during their visit and any new prescriptions provided to the patient are listed below.  Medications given during this visit Medications  fentaNYL (SUBLIMAZE) injection 100 mcg (100 mcg Intravenous Given 07/22/19 1027)  lidocaine-EPINEPHrine (XYLOCAINE W/EPI) 2 %-1:200000 (PF) injection 20 mL (20 mLs Intradermal Given 07/22/19 1056)  fentaNYL (SUBLIMAZE) injection 100 mcg (100 mcg Intravenous Given 07/22/19 1107)     The patient appears reasonably screen and/or stabilized for discharge and I doubt any other medical condition or other [facility] requiring further screening, evaluation, or treatment in the ED at this time prior to discharge.    Final Clinical Impressions(s) / ED Diagnoses   Final diagnoses:  None    ED Discharge  Orders    None       Melene Plan, DO 07/22/19 1142

## 2019-09-08 ENCOUNTER — Other Ambulatory Visit: Payer: Self-pay | Admitting: Registered Nurse

## 2019-09-08 MED ORDER — AMLODIPINE BESYLATE 5 MG PO TABS: ORAL_TABLET | ORAL | 0 refills | Status: DC

## 2019-09-08 NOTE — Telephone Encounter (Signed)
Medication Refill - Medication: amLODipine (NORVASC) 5 MG tablet [[id number]]    Has the patient contacted their pharmacy? No. (Agent: If no, request that the patient contact the pharmacy for the refill.) (Agent: If yes, when and what did the pharmacy advise?)  Preferred Pharmacy (with phone number or street name): [facility] [place], [place]  Agent: Please be advised that RX refills may take up to 3 business days. We ask that you follow-up with your pharmacy.

## 2019-10-09 ENCOUNTER — Other Ambulatory Visit: Payer: Self-pay | Admitting: Family Medicine

## 2019-11-10 ENCOUNTER — Telehealth: Payer: Self-pay | Admitting: Family Medicine

## 2019-11-10 NOTE — Telephone Encounter (Signed)
Spoke with pt and scheduled ov

## 2019-11-10 NOTE — Telephone Encounter (Signed)
PT REQUESTING REFILL ON BP MEDS. PT ADVISED THAT SHE MAY NEED AN OV. SHE WAS LAST SEEN A YEAR AGO BY DR. SANTIAGO. PLEASE ADVISE  PHARMACY ON FILE CONFIRMED

## 2019-11-10 NOTE — Telephone Encounter (Signed)
No further refills without office visit. 30 day was already sent last month. No further refills

## 2019-11-13 ENCOUNTER — Other Ambulatory Visit: Payer: Self-pay

## 2019-11-13 ENCOUNTER — Encounter: Payer: Self-pay | Admitting: Family Medicine

## 2019-11-13 ENCOUNTER — Ambulatory Visit: Payer: BC Managed Care – PPO | Admitting: Family Medicine

## 2019-11-13 VITALS — BP 140/90 | HR 70 | Temp 98.9°F | Ht 63.0 in | Wt 205.0 lb

## 2019-11-13 DIAGNOSIS — I10 Essential (primary) hypertension: Secondary | ICD-10-CM | POA: Diagnosis not present

## 2019-11-13 MED ORDER — AMLODIPINE BESYLATE 5 MG PO TABS: 5.0000 mg | ORAL_TABLET | Freq: Every day | ORAL | 3 refills | Status: DC

## 2019-11-13 NOTE — Patient Instructions (Signed)
° ° ° °  If you have lab work done today you will be contacted with your lab results within the next 2 weeks.  If you have not heard from us then please contact us. The fastest way to get your results is to register for My Chart. ° ° °IF you received an x-ray today, you will receive an invoice from [facility]. Please contact [facility] at [phone] with questions or concerns regarding your invoice.  ° °IF you received labwork today, you will receive an invoice from [facility]. Please contact [facility] at [phone] with questions or concerns regarding your invoice.  ° °Our billing staff will not be able to assist you with questions regarding bills from these companies. ° °You will be contacted with the lab results as soon as they are available. The fastest way to get your results is to activate your My Chart account. Instructions are located on the last page of this paperwork. If you have not heard from us regarding the results in 2 weeks, please contact this office. °  ° ° ° °

## 2019-11-13 NOTE — Progress Notes (Signed)
2/11/20211:52 PM  Patty Gilmore Apr 13, 1991, 29 y.o., female [id number]  Chief Complaint  Patient presents with  . Hypertension    HPI:   Patient is a 29 y.o. female with past medical history significant for HTN, abnormal pap, migraines who presents today for followup on HTN  Last OV feb 2020 Reports doing well Has no acute concerns today Takes amlodipine 5m daily Denies any side effects Had nexplanon removed and reinserted  BP Readings from Last 3 Encounters:  11/13/19 140/90  07/22/19 (!) 168/93  07/22/19 (!) 155/100   Wt Readings from Last 3 Encounters:  11/13/19 205 lb (93 kg)  07/22/19 199 lb (90.3 kg)  07/11/19 199 lb (90.3 kg)     [facility] 11/13/2019 07/22/2019 11/08/2018  Decreased Interest 0 0 0  Down, Depressed, Hopeless 0 0 0  PHQ - 2 Score 0 0 0  Altered sleeping - - -  Tired, decreased energy - - -  Change in appetite - - -  Feeling bad or failure about yourself  - - -  Trouble concentrating - - -  Moving slowly or fidgety/restless - - -  Suicidal thoughts - - -  PHQ-9 Score - - -    Fall Risk  11/13/2019 07/22/2019 11/08/2018 04/17/2018 01/31/2018  Falls in the past year? 0 0 0 No No  Number falls in past yr: 0 0 0 - -  Injury with Fall? 0 0 0 - -     No Known Allergies  Prior to Admission medications   Medication Sig Start Date End Date Taking? Authorizing Provider  amLODipine (NORVASC) 5 MG tablet TAKE 1 TABLET(5 MG) BY MOUTH DAILY 10/09/19  Yes SRutherford Guys MD    Past Medical History:  Diagnosis Date  . 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 Hypertension    was taking amlodipine stopped about 1 yr ago - no insurance - had been on meds since age 29 . Pregnancy induced hypertension     Past Surgical History:  Procedure Laterality Date  . CESAREAN SECTION N/A 05/04/2016   Procedure: CESAREAN SECTION;  Surgeon: JJonnie Kind MD;  [place] [facility]   Service: Obstetrics;  Laterality: N/A;  . LUMBAR PUNCTURE  07/20/2014   fluid build up around spinal column  . NO PAST SURGERIES      Social History   Tobacco Use  . Smoking status: Never Smoker  . Smokeless tobacco: Never Used  Substance Use Topics  . Alcohol use: No    Comment: Rarely (less than once per month)    Family History  Problem Relation Age of Onset  . Diabetes Maternal Grandmother   . Diabetes Paternal Grandmother   . Thyroid disease Mother     Review of Systems  Constitutional: Negative for chills and fever.  Respiratory: Negative for cough and shortness of breath.   Cardiovascular: Negative for chest pain, palpitations and leg swelling.  Gastrointestinal: Negative for abdominal pain, nausea and vomiting.     OBJECTIVE:  Today's Vitals   11/13/19 1349  BP: 140/90  Pulse: 70  Temp: 98.9 F (37.2 C)  SpO2: 97%  Weight: 205 lb (93 kg)  Height: '5\' 3"'  (1.6 m)   Body mass index is 36.31 kg/m.   Physical Exam Vitals and nursing note reviewed.  Constitutional:      Appearance: She is well-developed.  HENT:     Head: Normocephalic and atraumatic.  Mouth/Throat:     Pharynx: No oropharyngeal exudate.  Eyes:     General: No scleral icterus.    Conjunctiva/sclera: Conjunctivae normal.     Pupils: Pupils are equal, round, and reactive to light.  Cardiovascular:     Rate and Rhythm: Normal rate and regular rhythm.     Heart sounds: Normal heart sounds. No murmur. No friction rub. No gallop.   Pulmonary:     Effort: Pulmonary effort is normal.     Breath sounds: Normal breath sounds. No wheezing, rhonchi or rales.  Musculoskeletal:     Cervical back: Neck supple.     Right lower leg: No edema.     Left lower leg: No edema.  Skin:    General: Skin is warm and dry.  Neurological:     Mental Status: She is alert and oriented to person, place, and time.     No results found for this or any previous visit (from the past 24 hour(s)).  No  results found.   ASSESSMENT and PLAN  1. Essential hypertension Controlled. Continue current regime.  - CMP14+EGFR - amLODipine (NORVASC) 5 MG tablet; Take 1 tablet (5 mg total) by mouth daily.  Return in about 1 year (around 11/12/2020).    Rutherford Guys, MD [facility] [place], [place] [address] Ph.  [phone] Fax [phone]

## 2019-11-14 LAB — CMP14+EGFR
ALT: 17 IU/L (ref 0–32)
AST: 17 IU/L (ref 0–40)
Albumin/Globulin Ratio: 1.5 (ref 1.2–2.2)
Albumin: 3.8 g/dL — ABNORMAL LOW (ref 3.9–5.0)
Alkaline Phosphatase: 80 IU/L (ref 39–117)
BUN/Creatinine Ratio: 10 (ref 9–23)
BUN: 13 mg/dL (ref 6–20)
Bilirubin Total: <0.2 mg/dL (ref 0.0–1.2)
CO2: 21 mmol/L (ref 20–29)
Calcium: 8.9 mg/dL (ref 8.7–10.2)
Chloride: 107 mmol/L — ABNORMAL HIGH (ref 96–106)
Creatinine, Ser: 1.33 mg/dL — ABNORMAL HIGH (ref 0.57–1.00)
GFR calc Af Amer: 63 mL/min/{1.73_m2} (ref >59)
GFR calc non Af Amer: 55 mL/min/{1.73_m2} — ABNORMAL LOW (ref >59)
Globulin, Total: 2.6 g/dL (ref 1.5–4.5)
Glucose: 76 mg/dL (ref 65–99)
Potassium: 5.1 mmol/L (ref 3.5–5.2)
Sodium: 140 mmol/L (ref 134–144)
Total Protein: 6.4 g/dL (ref 6.0–8.5)

## 2019-11-29 NOTE — Progress Notes (Signed)
Normal kidney and liver function

## 2020-08-13 ENCOUNTER — Telehealth: Payer: Self-pay | Admitting: Family Medicine

## 2020-08-13 ENCOUNTER — Telehealth: Payer: Self-pay | Admitting: Registered Nurse

## 2020-08-13 DIAGNOSIS — Z13220 Encounter for screening for lipoid disorders: Secondary | ICD-10-CM

## 2020-08-13 DIAGNOSIS — I10 Essential (primary) hypertension: Secondary | ICD-10-CM

## 2020-08-13 DIAGNOSIS — Z1211 Encounter for screening for malignant neoplasm of colon: Secondary | ICD-10-CM

## 2020-08-13 DIAGNOSIS — Z0000 Encounter for general adult medical examination without abnormal findings: Secondary | ICD-10-CM

## 2020-08-13 NOTE — Telephone Encounter (Signed)
error 

## 2020-08-13 NOTE — Telephone Encounter (Signed)
CBC and A1c as well please, thank you!  Rich

## 2020-08-13 NOTE — Telephone Encounter (Signed)
I have Future Ordered her labs for her upcoming CPE. Would you like to add any more lab orders for her upcoming Nurse visit?

## 2020-08-13 NOTE — Telephone Encounter (Signed)
Noted the additional labs have been added.

## 2020-08-13 NOTE — Telephone Encounter (Signed)
Pt is having her cpe on 12/16 and her labs are scheduled for 09/13/20. Please put lab orders in.

## 2020-09-13 ENCOUNTER — Other Ambulatory Visit: Payer: Self-pay

## 2020-09-13 ENCOUNTER — Ambulatory Visit (INDEPENDENT_AMBULATORY_CARE_PROVIDER_SITE_OTHER): Payer: BC Managed Care – PPO | Admitting: Family Medicine

## 2020-09-13 DIAGNOSIS — Z0000 Encounter for general adult medical examination without abnormal findings: Secondary | ICD-10-CM | POA: Diagnosis not present

## 2020-09-13 DIAGNOSIS — I10 Essential (primary) hypertension: Secondary | ICD-10-CM

## 2020-09-13 DIAGNOSIS — Z13220 Encounter for screening for lipoid disorders: Secondary | ICD-10-CM

## 2020-09-14 LAB — CMP14+EGFR
ALT: 14 IU/L (ref 0–32)
AST: 18 IU/L (ref 0–40)
Albumin/Globulin Ratio: 1.5 (ref 1.2–2.2)
Albumin: 4.2 g/dL (ref 3.9–5.0)
Alkaline Phosphatase: 71 IU/L (ref 44–121)
BUN/Creatinine Ratio: 12 (ref 9–23)
BUN: 15 mg/dL (ref 6–20)
Bilirubin Total: 0.3 mg/dL (ref 0.0–1.2)
CO2: 18 mmol/L — ABNORMAL LOW (ref 20–29)
Calcium: 9.3 mg/dL (ref 8.7–10.2)
Chloride: 102 mmol/L (ref 96–106)
Creatinine, Ser: 1.29 mg/dL — ABNORMAL HIGH (ref 0.57–1.00)
GFR calc Af Amer: 65 mL/min/{1.73_m2} (ref >59)
GFR calc non Af Amer: 56 mL/min/{1.73_m2} — ABNORMAL LOW (ref >59)
Globulin, Total: 2.8 g/dL (ref 1.5–4.5)
Glucose: 90 mg/dL (ref 65–99)
Potassium: 5 mmol/L (ref 3.5–5.2)
Sodium: 137 mmol/L (ref 134–144)
Total Protein: 7 g/dL (ref 6.0–8.5)

## 2020-09-14 LAB — CBC WITH DIFFERENTIAL/PLATELET
Basophils Absolute: 0 10*3/uL (ref 0.0–0.2)
Basos: 0 %
EOS (ABSOLUTE): 0.1 10*3/uL (ref 0.0–0.4)
Eos: 2 %
Hematocrit: 31.4 % — ABNORMAL LOW (ref 34.0–46.6)
Hemoglobin: 10.8 g/dL — ABNORMAL LOW (ref 11.1–15.9)
Immature Grans (Abs): 0 10*3/uL (ref 0.0–0.1)
Immature Granulocytes: 0 %
Lymphocytes Absolute: 1.8 10*3/uL (ref 0.7–3.1)
Lymphs: 37 %
MCH: 32.7 pg (ref 26.6–33.0)
MCHC: 34.4 g/dL (ref 31.5–35.7)
MCV: 95 fL (ref 79–97)
Monocytes Absolute: 0.3 10*3/uL (ref 0.1–0.9)
Monocytes: 6 %
Neutrophils Absolute: 2.7 10*3/uL (ref 1.4–7.0)
Neutrophils: 55 %
Platelets: 305 10*3/uL (ref 150–450)
RBC: 3.3 x10E6/uL — ABNORMAL LOW (ref 3.77–5.28)
RDW: 12.1 % (ref 11.7–15.4)
WBC: 4.9 10*3/uL (ref 3.4–10.8)

## 2020-09-14 LAB — LIPID PANEL
Chol/HDL Ratio: 2.4 ratio (ref 0.0–4.4)
Cholesterol, Total: 146 mg/dL (ref 100–199)
HDL: 60 mg/dL (ref >39)
LDL Chol Calc (NIH): 76 mg/dL (ref 0–99)
Triglycerides: 43 mg/dL (ref 0–149)
VLDL Cholesterol Cal: 10 mg/dL (ref 5–40)

## 2020-09-14 LAB — TSH: TSH: 1.05 u[IU]/mL (ref 0.450–4.500)

## 2020-09-14 LAB — HEMOGLOBIN A1C
Est. average glucose Bld gHb Est-mCnc: 111 mg/dL
Hgb A1c MFr Bld: 5.5 % (ref 4.8–5.6)

## 2020-09-14 NOTE — Progress Notes (Signed)
FYI

## 2020-09-16 ENCOUNTER — Other Ambulatory Visit: Payer: Self-pay

## 2020-09-16 ENCOUNTER — Encounter: Payer: Self-pay | Admitting: Family Medicine

## 2020-09-16 ENCOUNTER — Ambulatory Visit: Payer: BC Managed Care – PPO | Admitting: Family Medicine

## 2020-09-16 VITALS — BP 137/89 | HR 67 | Temp 98.0°F | Ht 63.0 in | Wt 212.0 lb

## 2020-09-16 DIAGNOSIS — N182 Chronic kidney disease, stage 2 (mild): Secondary | ICD-10-CM

## 2020-09-16 DIAGNOSIS — I10 Essential (primary) hypertension: Secondary | ICD-10-CM

## 2020-09-16 DIAGNOSIS — M5431 Sciatica, right side: Secondary | ICD-10-CM | POA: Diagnosis not present

## 2020-09-16 DIAGNOSIS — D649 Anemia, unspecified: Secondary | ICD-10-CM

## 2020-09-16 DIAGNOSIS — Z0000 Encounter for general adult medical examination without abnormal findings: Secondary | ICD-10-CM

## 2020-09-16 MED ORDER — HYDROCHLOROTHIAZIDE 12.5 MG PO TABS: 12.5000 mg | ORAL_TABLET | Freq: Every day | ORAL | 3 refills | Status: DC

## 2020-09-16 MED ORDER — FERROUS SULFATE 324 (65 FE) MG PO TBEC: 1.0000 | DELAYED_RELEASE_TABLET | Freq: Every day | ORAL | 3 refills | Status: DC

## 2020-09-16 MED ORDER — DOCUSATE SODIUM 50 MG PO CAPS: 50.0000 mg | ORAL_CAPSULE | Freq: Two times a day (BID) | ORAL | 0 refills | Status: DC

## 2020-09-16 NOTE — Progress Notes (Addendum)
12/16/20214:23 PM  Patty Gilmore Mar 09, 1991, 29 y.o., female [id number]  Chief Complaint  Patient presents with  . Annual Exam    Discuss creatinine and hemoglobin, refill amlodipine 30 day supply    HPI:   Patient is a 29 y.o. female with past medical history significant for HTN who presents today for Routine f/u.  Used to have issues with headaches Diagnosed by eye doctor and neurologist Had an LP to drain spinal fluid 2015 No headaches since  Works at [id number] Covid: Declined Pap:07/11/19 NILM, HPV neg  (2 prior abn 2017 and 2019) Had a colposcopy for both of those. [facility] Was supposed to follow up in October LMP: Nexplanon placed 07/11/19 Eye: yearly Dentist: q 6 months Denies family hx of breast and colon ca   HTN Amlodipine 5mg  Issues retaining water BP Readings from Last 3 Encounters:  09/16/20 137/89  11/13/19 140/90  07/22/19 (!) 168/93   CKD 2 Last GFR: 65 Lab Results  Component Value Date   CREATININE 1.29 (H) 09/13/2020   BUN 15 09/13/2020   NA 137 09/13/2020   K 5.0 09/13/2020   CL 102 09/13/2020   CO2 18 (L) 09/13/2020   estimated creatinine clearance is 71.6 mL/min (A) (by C-G formula based on SCr of 1.29 mg/dL (H)).  Anemia Lab Results  Component Value Date   WBC 4.9 09/13/2020   HGB 10.8 (L) 09/13/2020   HCT 31.4 (L) 09/13/2020   MCV 95 09/13/2020   PLT 305 09/13/2020      Depression screen PHQ 2/9 09/16/2020 11/13/2019 07/22/2019  Decreased Interest 0 0 0  Down, Depressed, Hopeless 0 0 0  PHQ - 2 Score 0 0 0  Altered sleeping - - -  Tired, decreased energy - - -  Change in appetite - - -  Feeling bad or failure about yourself  - - -  Trouble concentrating - - -  Moving slowly or fidgety/restless - - -  Suicidal thoughts - - -  PHQ-9 Score - - -    Fall Risk  09/16/2020 11/13/2019 07/22/2019 11/08/2018 04/17/2018  Falls in the past year? 0 0 0  0 No  Number falls in past yr: 0 0 0 0 -  Injury with Fall? 0 0 0 0 -  Follow up Falls evaluation completed - - - -     No Known Allergies  Prior to Admission medications   Medication Sig Start Date End Date Taking? Authorizing Provider  amLODipine (NORVASC) 5 MG tablet Take 1 tablet (5 mg total) by mouth daily. 11/13/19   01/11/20, MD    Past Medical History:  Diagnosis Date  . 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 Hypertension    was taking amlodipine stopped about 1 yr ago - no insurance - had been on meds since age 68  . Pregnancy induced hypertension     Past Surgical History:  Procedure Laterality Date  . CESAREAN SECTION N/A 05/04/2016   Procedure: CESAREAN SECTION;  Surgeon: 07/04/2016, MD;  Location: [facility] BIRTHING SUITES;  Service: Obstetrics;  Laterality: N/A;  . LUMBAR PUNCTURE  07/20/2014   fluid build up around spinal column  . NO PAST SURGERIES      Social History   Tobacco Use  . Smoking status: Never Smoker  . Smokeless tobacco: Never Used  Substance Use Topics  .  Alcohol use: No    Comment: Rarely (less than once per month)    Family History  Problem Relation Age of Onset  . Diabetes Maternal Grandmother   . Diabetes Paternal Grandmother   . Thyroid disease Mother     Review of Systems  Constitutional: Negative for chills, fever and malaise/fatigue.  Eyes: Negative for blurred vision and double vision.  Respiratory: Negative for cough, shortness of breath and wheezing.   Cardiovascular: Positive for leg swelling. Negative for chest pain and palpitations.  Gastrointestinal: Negative for abdominal pain, blood in stool, constipation, diarrhea, heartburn, nausea and vomiting.  Genitourinary: Negative for dysuria, frequency and hematuria.  Musculoskeletal: Positive for joint pain (Right Shoulder pain). Negative for back pain.       Occasional pain down right leg  Skin: Negative for  rash.  Neurological: Positive for headaches (less than once per week). Negative for dizziness and weakness.     OBJECTIVE:  Today's Vitals   09/16/20 1542  BP: 137/89  Pulse: 67  Temp: 98 F (36.7 C)  SpO2: 100%  Weight: 212 lb (96.2 kg)  Height: 5\' 3"  (1.6 m)   Body mass index is 37.55 kg/m.   Physical Exam Vitals reviewed.  Constitutional:      Appearance: Normal appearance.  HENT:     Right Ear: Tympanic membrane, ear canal and external ear normal. There is no impacted cerumen.     Left Ear: Tympanic membrane, ear canal and external ear normal. There is no impacted cerumen.     Nose: Nose normal.  Eyes:     Extraocular Movements: Extraocular movements intact.     Pupils: Pupils are equal, round, and reactive to light.  Neck:     Thyroid: No thyroid mass, thyromegaly or thyroid tenderness.  Cardiovascular:     Rate and Rhythm: Normal rate and regular rhythm.     Pulses: Normal pulses.     Heart sounds: Normal heart sounds.  Pulmonary:     Effort: Pulmonary effort is normal.     Breath sounds: Normal breath sounds.  Abdominal:     General: Bowel sounds are normal.     Palpations: Abdomen is soft.  Musculoskeletal:        General: Normal range of motion.     Right shoulder: Normal.     Left shoulder: Normal.     Cervical back: Normal range of motion.     Right lower leg: Edema present.     Left lower leg: Edema present.  Lymphadenopathy:     Cervical: No cervical adenopathy.  Skin:    General: Skin is warm and dry.     Capillary Refill: Capillary refill takes less than 2 seconds.  Neurological:     General: No focal deficit present.     Mental Status: She is alert and oriented to person, place, and time.  Psychiatric:        Mood and Affect: Mood normal.        Behavior: Behavior normal.     No results found for this or any previous visit (from the past 24 hour(s)).  No results found.   ASSESSMENT and PLAN  Problem List Items Addressed This  Visit   None   Visit Diagnoses    CKD (chronic kidney disease) stage 2, GFR 60-89 ml/min    -  Primary  Will continue to monitor lab results.   Anemia, unspecified type       Relevant Medications   ferrous sulfate 324 (65  Fe) MG TBEC   docusate sodium (COLACE) 50 MG capsule  Will follow up with lab results.   Essential hypertension       Relevant Medications   hydrochlorothiazide (HYDRODIURIL) 12.5 MG tablet In addition to amlodipine 5 mg daily BP goal< 130/80   Sciatica of right side       Annual physical exam      Discussed LFM Pap smear due (she will schedule)      Return in about 3 months (around 12/15/2020).   Patty Carls Maddix Heinz, FNP-BC Primary Care at [facility] [address] [place], [place] [address] Ph.  [phone] Fax [phone]  I have reviewed and agree with above documentation. Edwina Barth, MD

## 2020-09-16 NOTE — Patient Instructions (Addendum)
  Health Maintenance, Female Adopting a healthy lifestyle and getting preventive care are important in promoting health and wellness. Ask your health care provider about:  The right schedule for you to have regular tests and exams.  Things you can do on your own to prevent diseases and keep yourself healthy. What should I know about diet, weight, and exercise? Eat a healthy diet   Eat a diet that includes plenty of vegetables, fruits, low-fat dairy products, and lean protein.  Do not eat a lot of foods that are high in solid fats, added sugars, or sodium. Maintain a healthy weight Body mass index (BMI) is used to identify weight problems. It estimates body fat based on height and weight. Your health care provider can help determine your BMI and help you achieve or maintain a healthy weight. Get regular exercise Get regular exercise. This is one of the most important things you can do for your health. Most adults should:  Exercise for at least 150 minutes each week. The exercise should increase your heart rate and make you sweat (moderate-intensity exercise).  Do strengthening exercises at least twice a week. This is in addition to the moderate-intensity exercise.  Spend less time sitting. Even light physical activity can be beneficial. Watch cholesterol and blood lipids Have your blood tested for lipids and cholesterol at 28 years of age, then have this test every 5 years. Have your cholesterol levels checked more often if:  Your lipid or cholesterol levels are high.  You are older than 28 years of age.  You are at high risk for heart disease. What should I know about cancer screening? Depending on your health history and family history, you may need to have cancer screening at various ages. This may include screening for:  Breast cancer.  Cervical cancer.  Colorectal cancer.  Skin cancer.  Lung cancer. What should I know about heart disease, diabetes, and high blood  pressure? Blood pressure and heart disease  High blood pressure causes heart disease and increases the risk of stroke. This is more likely to develop in people who have high blood pressure readings, are of African descent, or are overweight.  Have your blood pressure checked: ? Every 3-5 years if you are 18-39 years of age. ? Every year if you are 40 years old or older. Diabetes Have regular diabetes screenings. This checks your fasting blood sugar level. Have the screening done:  Once every three years after age 40 if you are at a normal weight and have a low risk for diabetes.  More often and at a younger age if you are overweight or have a high risk for diabetes. What should I know about preventing infection? Hepatitis B If you have a higher risk for hepatitis B, you should be screened for this virus. Talk with your health care provider to find out if you are at risk for hepatitis B infection. Hepatitis C Testing is recommended for:  Everyone born from 1945 through 1965.  Anyone with known risk factors for hepatitis C. Sexually transmitted infections (STIs)  Get screened for STIs, including gonorrhea and chlamydia, if: ? You are sexually active and are younger than 28 years of age. ? You are older than 28 years of age and your health care provider tells you that you are at risk for this type of infection. ? Your sexual activity has changed since you were last screened, and you are at increased risk for chlamydia or gonorrhea. Ask your health care   provider if you are at risk.  Ask your health care provider about whether you are at high risk for HIV. Your health care provider may recommend a prescription medicine to help prevent HIV infection. If you choose to take medicine to prevent HIV, you should first get tested for HIV. You should then be tested every 3 months for as long as you are taking the medicine. Pregnancy  If you are about to stop having your period (premenopausal) and  you may become pregnant, seek counseling before you get pregnant.  Take 400 to 800 micrograms (mcg) of folic acid every day if you become pregnant.  Ask for birth control (contraception) if you want to prevent pregnancy. Osteoporosis and menopause Osteoporosis is a disease in which the bones lose minerals and strength with aging. This can result in bone fractures. If you are 65 years old or older, or if you are at risk for osteoporosis and fractures, ask your health care provider if you should:  Be screened for bone loss.  Take a calcium or vitamin D supplement to lower your risk of fractures.  Be given hormone replacement therapy (HRT) to treat symptoms of menopause. Follow these instructions at home: Lifestyle  Do not use any products that contain nicotine or tobacco, such as cigarettes, e-cigarettes, and chewing tobacco. If you need help quitting, ask your health care provider.  Do not use street drugs.  Do not share needles.  Ask your health care provider for help if you need support or information about quitting drugs. Alcohol use  Do not drink alcohol if: ? Your health care provider tells you not to drink. ? You are pregnant, may be pregnant, or are planning to become pregnant.  If you drink alcohol: ? Limit how much you use to 0-1 drink a day. ? Limit intake if you are breastfeeding.  Be aware of how much alcohol is in your drink. In the [place] one drink equals one 12 oz bottle of beer (355 mL), one 5 oz glass of wine (148 mL), or one 1 oz glass of hard liquor (44 mL). General instructions  Schedule regular health, dental, and eye exams.  Stay current with your vaccines.  Tell your health care provider if: ? You often feel depressed. ? You have ever been abused or do not feel safe at home. Summary  Adopting a healthy lifestyle and getting preventive care are important in promoting health and wellness.  Follow your health care provider's instructions about healthy  diet, exercising, and getting tested or screened for diseases.  Follow your health care provider's instructions on monitoring your cholesterol and blood pressure. This information is not intended to replace advice given to you by your health care provider. Make sure you discuss any questions you have with your health care provider. Document Revised: 09/11/2018 Document Reviewed: 09/11/2018 Elsevier Patient Education  2020 Elsevier Inc.   If you have lab work done today you will be contacted with your lab results within the next 2 weeks.  If you have not heard from us then please contact us. The fastest way to get your results is to register for My Chart.   IF you received an x-ray today, you will receive an invoice from [facility]. Please contact [facility] at [phone] with questions or concerns regarding your invoice.   IF you received labwork today, you will receive an invoice from [facility]. Please contact [facility] at [phone] with questions or concerns regarding your invoice.   Our billing staff   will not be able to assist you with questions regarding bills from these companies.  You will be contacted with the lab results as soon as they are available. The fastest way to get your results is to activate your My Chart account. Instructions are located on the last page of this paperwork. If you have not heard from us regarding the results in 2 weeks, please contact this office.      

## 2020-09-17 ENCOUNTER — Encounter: Payer: Self-pay | Admitting: Family Medicine

## 2020-09-23 ENCOUNTER — Ambulatory Visit: Payer: BC Managed Care – PPO | Admitting: Family Medicine

## 2020-10-05 ENCOUNTER — Telehealth: Payer: Self-pay | Admitting: Family Medicine

## 2020-10-05 ENCOUNTER — Ambulatory Visit: Payer: BC Managed Care – PPO | Admitting: Family Medicine

## 2020-10-14 ENCOUNTER — Ambulatory Visit: Payer: BC Managed Care – PPO | Admitting: Family Medicine

## 2020-10-14 ENCOUNTER — Other Ambulatory Visit: Payer: Self-pay

## 2020-10-14 ENCOUNTER — Encounter: Payer: Self-pay | Admitting: Family Medicine

## 2020-10-14 ENCOUNTER — Other Ambulatory Visit (HOSPITAL_COMMUNITY)
Admission: RE | Admit: 2020-10-14 | Discharge: 2020-10-14 | Disposition: A | Discharge status: Home / Self Care | Payer: BC Managed Care – PPO | Source: Ambulatory Visit | Attending: Family Medicine | Admitting: Family Medicine

## 2020-10-14 VITALS — BP 132/85 | HR 79 | Temp 97.1°F | Wt 210.0 lb

## 2020-10-14 DIAGNOSIS — R87610 Atypical squamous cells of undetermined significance on cytologic smear of cervix (ASC-US): Secondary | ICD-10-CM | POA: Insufficient documentation

## 2020-10-14 DIAGNOSIS — Z113 Encounter for screening for infections with a predominantly sexual mode of transmission: Secondary | ICD-10-CM | POA: Diagnosis not present

## 2020-10-14 DIAGNOSIS — A5901 Trichomonal vulvovaginitis: Secondary | ICD-10-CM | POA: Insufficient documentation

## 2020-10-14 DIAGNOSIS — Z124 Encounter for screening for malignant neoplasm of cervix: Secondary | ICD-10-CM

## 2020-10-14 DIAGNOSIS — I10 Essential (primary) hypertension: Secondary | ICD-10-CM | POA: Diagnosis not present

## 2020-10-14 MED ORDER — HYDROCHLOROTHIAZIDE 25 MG PO TABS: 25.0000 mg | ORAL_TABLET | Freq: Every day | ORAL | 1 refills | Status: DC

## 2020-10-14 NOTE — Progress Notes (Addendum)
1/13/20229:23 AM  Patty Gilmore 10-Mar-1991, 30 y.o., female [id number]  Chief Complaint  Patient presents with  . Gynecologic Exam    With std screenings    HPI:   Patient is a 30 y.o. female with past medical history significant for HTN who presents today for pap  Pap:07/11/19 NILM, HPV neg  (2 prior abn 2017 and 2019) Had a colposcopy for both of those. [facility] Was supposed to follow up in October LMP: Nexplanon placed 07/11/19 Periods extremely irregular, not painful Would like STI testing today   HTN Doing well on HCTZ BP Readings from Last 3 Encounters:  10/14/20 132/85  09/16/20 137/89  11/13/19 140/90     Depression screen [facility] 2/9 10/14/2020 09/16/2020 11/13/2019  Decreased Interest 0 0 0  Down, Depressed, Hopeless 0 0 0  PHQ - 2 Score 0 0 0  Altered sleeping - - -  Tired, decreased energy - - -  Change in appetite - - -  Feeling bad or failure about yourself  - - -  Trouble concentrating - - -  Moving slowly or fidgety/restless - - -  Suicidal thoughts - - -  PHQ-9 Score - - -    Fall Risk  10/14/2020 09/16/2020 11/13/2019 07/22/2019 11/08/2018  Falls in the past year? 0 0 0 0 0  Number falls in past yr: 0 0 0 0 0  Injury with Fall? 0 0 0 0 0  Follow up Falls evaluation completed Falls evaluation completed - - -     No Known Allergies  Prior to Admission medications   Medication Sig Start Date End Date Taking? Authorizing Provider  amLODipine (NORVASC) 5 MG tablet Take 1 tablet (5 mg total) by mouth daily. 11/13/19  Yes Lezlie Lye, Meda Coffee, MD  docusate sodium (COLACE) 50 MG capsule Take 1 capsule (50 mg total) by mouth 2 (two) times daily. 09/16/20  Yes Azuri Bozard, Azalee Course, FNP  ferrous sulfate 324 (65 Fe) MG TBEC Take 1 tablet (325 mg total) by mouth daily. 09/16/20 09/16/21 Yes Yaslene Lindamood, Azalee Course, FNP  hydrochlorothiazide (HYDRODIURIL) 12.5 MG tablet Take 1 tablet (12.5 mg total) by mouth daily. 09/16/20   Yes Zoiey Christy, Azalee Course, FNP    Past Medical History:  Diagnosis Date  . 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 Hypertension    was taking amlodipine stopped about 1 yr ago - no insurance - had been on meds since age 34  . Pregnancy induced hypertension     Past Surgical History:  Procedure Laterality Date  . CESAREAN SECTION N/A 05/04/2016   Procedure: CESAREAN SECTION;  Surgeon: Tilda Burrow, MD;  Location: [facility] BIRTHING SUITES;  Service: Obstetrics;  Laterality: N/A;  . LUMBAR PUNCTURE  07/20/2014   fluid build up around spinal column  . NO PAST SURGERIES      Social History   Tobacco Use  . Smoking status: Never Smoker  . Smokeless tobacco: Never Used  Substance Use Topics  . Alcohol use: No    Comment: Rarely (less than once per month)    Family History  Problem Relation Age of Onset  . Diabetes Maternal Grandmother   . Diabetes Paternal Grandmother   . Thyroid disease Mother     Review of Systems  Constitutional: Negative for chills, fever and malaise/fatigue.  Eyes: Negative for blurred vision and double vision.  Respiratory: Negative for cough, shortness of  breath and wheezing.   Cardiovascular: Negative for chest pain, palpitations and leg swelling.  Gastrointestinal: Negative for abdominal pain, blood in stool, constipation, diarrhea, heartburn, nausea and vomiting.  Genitourinary: Negative for dysuria, frequency and hematuria.  Musculoskeletal: Negative for back pain and joint pain.  Skin: Negative for rash.  Neurological: Negative for dizziness, weakness and headaches.     OBJECTIVE:  Today's Vitals   10/14/20 0921  BP: 132/85  Pulse: 79  Temp: (!) 97.1 F (36.2 C)  SpO2: 99%  Weight: 210 lb (95.3 kg)   Body mass index is 37.2 kg/m.   Physical Exam Constitutional:      General: She is not in acute distress.    Appearance: Normal appearance. She is not ill-appearing.  Pulmonary:      Effort: Pulmonary effort is normal. No respiratory distress.  Neurological:     Mental Status: She is alert and oriented to person, place, and time.  Psychiatric:        Mood and Affect: Mood normal.   Pelvic exam: normal external genitalia, vulva, vagina, cervix, uterus and adnexa, VULVA: normal appearing vulva with no masses, tenderness or lesions, VAGINA: normal appearing vagina with normal color and discharge, no lesions, CERVIX: normal appearing cervix without discharge or lesions, UTERUS: uterus is normal size, shape, consistency and nontender, ADNEXA: normal adnexa in size, nontender and no masses, PAP: Pap smear done today, thin-prep method, DNA probe for chlamydia and GC obtained, HPV test, exam chaperoned by Sunny Schlein, CMA.  No results found for this or any previous visit (from the past 24 hour(s)).  No results found.   ASSESSMENT and PLAN  Problem List Items Addressed This Visit   None   Visit Diagnoses    Encounter for Pap smear of cervix with HPV DNA cotesting    -  Primary   Relevant Orders   Cytology - PAP([place])   Essential hypertension       Relevant Medications   hydrochlorothiazide (HYDRODIURIL) 25 MG tablet      Plan . Will follow up with pap results . Increase HCTZ from 12.5 to 25   Return for Next scheduled visit.    Macario Carls Jahkai Yandell, FNP-BC Primary Care at [facility] [address] [place], [place] [address] Ph.  [phone] Fax [phone]  I have reviewed and agree with above documentation. Edwina Barth, MD

## 2020-10-14 NOTE — Patient Instructions (Addendum)
 Pap Test Why am I having this test? A Pap test, also called a Pap smear, is a screening test to check for signs of:  Cancer of the vagina, cervix, and uterus. The cervix is the lower part of the uterus that opens into the vagina.  Infection.  Changes that may be a sign that cancer is developing (precancerous changes). Women need this test on a regular basis. In general, you should have a Pap test every 3 years until you reach menopause or age 29. Women aged 30-60 may choose to have their Pap test done at the same time as an HPV (human papillomavirus) test every 5 years (instead of every 3 years). Your health care provider may recommend having Pap tests more or less often depending on your medical conditions and past Pap test results. What kind of sample is taken? Your health care provider will collect a sample of cells from the surface of your cervix. This will be done using a small cotton swab, plastic spatula, or brush. This sample is often collected during a pelvic exam, when you are lying on your back on an exam table with feet in footrests (stirrups). In some cases, fluids (secretions) from the cervix or vagina may also be collected.   How do I prepare for this test?  Be aware of where you are in your menstrual cycle. If you are menstruating on the day of the test, you may be asked to reschedule.  You may need to reschedule if you have a known vaginal infection on the day of the test.  Follow instructions from your health care provider about: ? Changing or stopping your regular medicines. Some medicines can cause abnormal test results, such as digitalis and tetracycline. ? Avoiding douching or taking a bath the day before or the day of the test. Tell a health care provider about:  Any allergies you have.  All medicines you are taking, including vitamins, herbs, eye drops, creams, and over-the-counter medicines.  Any blood disorders you have.  Any surgeries you have had.  Any  medical conditions you have.  Whether you are pregnant or may be pregnant. How are the results reported? Your test results will be reported as either abnormal or normal. A false-positive result can occur. A false positive is incorrect because it means that a condition is present when it is not. A false-negative result can occur. A false negative is incorrect because it means that a condition is not present when it is. What do the results mean? A normal test result means that you do not have signs of cancer of the vagina, cervix, or uterus. An abnormal result may mean that you have:  Cancer. A Pap test by itself is not enough to diagnose cancer. You will have more tests done in this case.  Precancerous changes in your vagina, cervix, or uterus.  Inflammation of the cervix.  An STD (sexually transmitted disease).  A fungal infection.  A parasite infection. Talk with your health care provider about what your results mean. Questions to ask your health care provider Ask your health care provider, or the department that is doing the test:  When will my results be ready?  How will I get my results?  What are my treatment options?  What other tests do I need?  What are my next steps? Summary  In general, women should have a Pap test every 3 years until they reach menopause or age 29.  Your health care provider will   collect a sample of cells from the surface of your cervix. This will be done using a small cotton swab, plastic spatula, or brush.  In some cases, fluids (secretions) from the cervix or vagina may also be collected. This information is not intended to replace advice given to you by your health care provider. Make sure you discuss any questions you have with your health care provider. Document Revised: 05/26/2020 Document Reviewed: 05/21/2020 Elsevier Patient Education  2021 Elsevier Inc.    If you have lab work done today you will be contacted with your lab results  within the next 2 weeks.  If you have not heard from us then please contact us. The fastest way to get your results is to register for My Chart.   IF you received an x-ray today, you will receive an invoice from [facility]. Please contact  Radiology at [phone] with questions or concerns regarding your invoice.   IF you received labwork today, you will receive an invoice from [facility]. Please contact [facility] at [phone] with questions or concerns regarding your invoice.   Our billing staff will not be able to assist you with questions regarding bills from these companies.  You will be contacted with the lab results as soon as they are available. The fastest way to get your results is to activate your My Chart account. Instructions are located on the last page of this paperwork. If you have not heard from us regarding the results in 2 weeks, please contact this office.      

## 2020-10-19 ENCOUNTER — Other Ambulatory Visit: Payer: BC Managed Care – PPO

## 2020-10-20 ENCOUNTER — Other Ambulatory Visit: Payer: Self-pay

## 2020-10-20 ENCOUNTER — Other Ambulatory Visit: Payer: Self-pay | Admitting: Family Medicine

## 2020-10-20 ENCOUNTER — Other Ambulatory Visit: Payer: BC Managed Care – PPO

## 2020-10-20 DIAGNOSIS — Z20822 Contact with and (suspected) exposure to covid-19: Secondary | ICD-10-CM | POA: Diagnosis not present

## 2020-10-20 DIAGNOSIS — A599 Trichomoniasis, unspecified: Secondary | ICD-10-CM

## 2020-10-20 DIAGNOSIS — R87610 Atypical squamous cells of undetermined significance on cytologic smear of cervix (ASC-US): Secondary | ICD-10-CM

## 2020-10-20 LAB — CYTOLOGY - PAP
Chlamydia: NEGATIVE
Comment: NEGATIVE
Comment: NEGATIVE
Comment: NEGATIVE
Comment: NEGATIVE
Comment: NORMAL
Diagnosis: UNDETERMINED — AB
HSV1: NEGATIVE
HSV2: NEGATIVE
High risk HPV: NEGATIVE
Neisseria Gonorrhea: NEGATIVE
Trichomonas: POSITIVE — AB

## 2020-10-20 MED ORDER — METRONIDAZOLE 500 MG PO TABS: 2000.0000 mg | ORAL_TABLET | Freq: Once | ORAL | 0 refills | Status: AC

## 2020-10-22 LAB — NOVEL CORONAVIRUS, NAA: SARS-CoV-2, NAA: DETECTED — AB

## 2020-10-22 LAB — SARS-COV-2, NAA 2 DAY TAT

## 2020-11-12 ENCOUNTER — Encounter: Payer: BC Managed Care – PPO | Admitting: Obstetrics and Gynecology

## 2020-11-15 ENCOUNTER — Encounter: Payer: BC Managed Care – PPO | Admitting: Family Medicine

## 2020-12-16 ENCOUNTER — Other Ambulatory Visit: Payer: Self-pay

## 2020-12-16 ENCOUNTER — Encounter: Payer: Self-pay | Admitting: Family Medicine

## 2020-12-16 ENCOUNTER — Ambulatory Visit: Payer: BC Managed Care – PPO | Admitting: Family Medicine

## 2020-12-16 VITALS — BP 119/78 | HR 87 | Temp 98.7°F | Ht 63.0 in | Wt 211.0 lb

## 2020-12-16 DIAGNOSIS — I10 Essential (primary) hypertension: Secondary | ICD-10-CM

## 2020-12-16 DIAGNOSIS — D649 Anemia, unspecified: Secondary | ICD-10-CM

## 2020-12-16 MED ORDER — HYDROCHLOROTHIAZIDE 25 MG PO TABS: 25.0000 mg | ORAL_TABLET | Freq: Every day | ORAL | 3 refills | Status: DC

## 2020-12-16 MED ORDER — AMLODIPINE BESYLATE 5 MG PO TABS: 5.0000 mg | ORAL_TABLET | Freq: Every day | ORAL | 3 refills | Status: DC

## 2020-12-16 NOTE — Patient Instructions (Addendum)
  Health Maintenance, Female Adopting a healthy lifestyle and getting preventive care are important in promoting health and wellness. Ask your health care provider about:  The right schedule for you to have regular tests and exams.  Things you can do on your own to prevent diseases and keep yourself healthy. What should I know about diet, weight, and exercise? Eat a healthy diet  Eat a diet that includes plenty of vegetables, fruits, low-fat dairy products, and lean protein.  Do not eat a lot of foods that are high in solid fats, added sugars, or sodium.   Maintain a healthy weight Body mass index (BMI) is used to identify weight problems. It estimates body fat based on height and weight. Your health care provider can help determine your BMI and help you achieve or maintain a healthy weight. Get regular exercise Get regular exercise. This is one of the most important things you can do for your health. Most adults should:  Exercise for at least 150 minutes each week. The exercise should increase your heart rate and make you sweat (moderate-intensity exercise).  Do strengthening exercises at least twice a week. This is in addition to the moderate-intensity exercise.  Spend less time sitting. Even light physical activity can be beneficial. Watch cholesterol and blood lipids Have your blood tested for lipids and cholesterol at 29 years of age, then have this test every 5 years. Have your cholesterol levels checked more often if:  Your lipid or cholesterol levels are high.  You are older than 29 years of age.  You are at high risk for heart disease. What should I know about cancer screening? Depending on your health history and family history, you may need to have cancer screening at various ages. This may include screening for:  Breast cancer.  Cervical cancer.  Colorectal cancer.  Skin cancer.  Lung cancer. What should I know about heart disease, diabetes, and high blood  pressure? Blood pressure and heart disease  High blood pressure causes heart disease and increases the risk of stroke. This is more likely to develop in people who have high blood pressure readings, are of African descent, or are overweight.  Have your blood pressure checked: ? Every 3-5 years if you are 18-39 years of age. ? Every year if you are 40 years old or older. Diabetes Have regular diabetes screenings. This checks your fasting blood sugar level. Have the screening done:  Once every three years after age 40 if you are at a normal weight and have a low risk for diabetes.  More often and at a younger age if you are overweight or have a high risk for diabetes. What should I know about preventing infection? Hepatitis B If you have a higher risk for hepatitis B, you should be screened for this virus. Talk with your health care provider to find out if you are at risk for hepatitis B infection. Hepatitis C Testing is recommended for:  Everyone born from 1945 through 1965.  Anyone with known risk factors for hepatitis C. Sexually transmitted infections (STIs)  Get screened for STIs, including gonorrhea and chlamydia, if: ? You are sexually active and are younger than 29 years of age. ? You are older than 29 years of age and your health care provider tells you that you are at risk for this type of infection. ? Your sexual activity has changed since you were last screened, and you are at increased risk for chlamydia or gonorrhea. Ask your health   care provider if you are at risk.  Ask your health care provider about whether you are at high risk for HIV. Your health care provider may recommend a prescription medicine to help prevent HIV infection. If you choose to take medicine to prevent HIV, you should first get tested for HIV. You should then be tested every 3 months for as long as you are taking the medicine. Pregnancy  If you are about to stop having your period (premenopausal) and  you may become pregnant, seek counseling before you get pregnant.  Take 400 to 800 micrograms (mcg) of folic acid every day if you become pregnant.  Ask for birth control (contraception) if you want to prevent pregnancy. Osteoporosis and menopause Osteoporosis is a disease in which the bones lose minerals and strength with aging. This can result in bone fractures. If you are 65 years old or older, or if you are at risk for osteoporosis and fractures, ask your health care provider if you should:  Be screened for bone loss.  Take a calcium or vitamin D supplement to lower your risk of fractures.  Be given hormone replacement therapy (HRT) to treat symptoms of menopause. Follow these instructions at home: Lifestyle  Do not use any products that contain nicotine or tobacco, such as cigarettes, e-cigarettes, and chewing tobacco. If you need help quitting, ask your health care provider.  Do not use street drugs.  Do not share needles.  Ask your health care provider for help if you need support or information about quitting drugs. Alcohol use  Do not drink alcohol if: ? Your health care provider tells you not to drink. ? You are pregnant, may be pregnant, or are planning to become pregnant.  If you drink alcohol: ? Limit how much you use to 0-1 drink a day. ? Limit intake if you are breastfeeding.  Be aware of how much alcohol is in your drink. In the [place] one drink equals one 12 oz bottle of beer (355 mL), one 5 oz glass of wine (148 mL), or one 1 oz glass of hard liquor (44 mL). General instructions  Schedule regular health, dental, and eye exams.  Stay current with your vaccines.  Tell your health care provider if: ? You often feel depressed. ? You have ever been abused or do not feel safe at home. Summary  Adopting a healthy lifestyle and getting preventive care are important in promoting health and wellness.  Follow your health care provider's instructions about healthy  diet, exercising, and getting tested or screened for diseases.  Follow your health care provider's instructions on monitoring your cholesterol and blood pressure. This information is not intended to replace advice given to you by your health care provider. Make sure you discuss any questions you have with your health care provider. Document Revised: 09/11/2018 Document Reviewed: 09/11/2018 Elsevier Patient Education  2021 Elsevier Inc.   If you have lab work done today you will be contacted with your lab results within the next 2 weeks.  If you have not heard from us then please contact us. The fastest way to get your results is to register for My Chart.   IF you received an x-ray today, you will receive an invoice from [facility]. Please contact [facility] at [phone] with questions or concerns regarding your invoice.   IF you received labwork today, you will receive an invoice from [facility]. Please contact [facility] at [phone] with questions or concerns regarding your invoice.   Our billing   staff will not be able to assist you with questions regarding bills from these companies.  You will be contacted with the lab results as soon as they are available. The fastest way to get your results is to activate your My Chart account. Instructions are located on the last page of this paperwork. If you have not heard from us regarding the results in 2 weeks, please contact this office.      

## 2020-12-16 NOTE — Progress Notes (Signed)
3/17/20229:03 AM  Patty Gilmore March 03, 1991, 30 y.o., female [id number]  Chief Complaint  Patient presents with  . Hypertension    Follow up     HPI:   Patient is a 30 y.o. female with past medical history significant for HTN who presents today for Routine f/u.  Got COVID late January Doing well now Was treated for Trich  Screenings OZD:[id number] Covid: Declined Pap:10/14/20 ASCUS, HPV neg, Positive for trich  (2 prior abn 2017 and 2019) Will see GYN next week LMP: Nexplanon placed 07/11/19 Eye: yearly Dentist: q 6 months Denies family hx of breast and colon ca  Health Maintenance  Topic Date Due  . COVID-19 Vaccine (1) Never done  . INFLUENZA VACCINE  12/30/2020 (Originally 05/02/2020)  . PAP-Cervical Cytology Screening  10/15/2023  . PAP SMEAR-Modifier  10/15/2023  . TETANUS/TDAP  04/24/2026  . Hepatitis C Screening  Completed  . HIV Screening  Completed  . 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 Lab Results  Component Value Date   CREATININE 1.29 (H) 09/13/2020   BUN 15 09/13/2020   NA 137 09/13/2020   K 5.0 09/13/2020   CL 102 09/13/2020   CO2 18 (L) 09/13/2020   Anemia Not taking Iron Didn't tolerate even with stool softener Lab Results  Component Value Date   WBC 4.9 09/13/2020   HGB 10.8 (L) 09/13/2020   HCT 31.4 (L) 09/13/2020   MCV 95 09/13/2020   PLT 305 09/13/2020     Depression screen PHQ 2/9 12/16/2020 10/14/2020 09/16/2020  Decreased Interest 0 0 0  Down, Depressed, Hopeless 0 0 0  PHQ - 2 Score 0 0 0  Altered sleeping - - -  Tired, decreased energy - - -  Change in appetite - - -  Feeling bad or failure about yourself  - - -  Trouble concentrating - - -  Moving slowly or fidgety/restless - - -  Suicidal thoughts - - -  PHQ-9 Score - - -    Fall Risk   12/16/2020 10/14/2020 09/16/2020 11/13/2019 07/22/2019  Falls in the past year? 0 0 0 0 0  Number falls in past yr: 0 0 0 0 0  Injury with Fall? 0 0 0 0 0  Follow up Falls evaluation completed Falls evaluation completed Falls evaluation completed - -     No Known Allergies  Prior to Admission medications   Medication Sig Start Date End Date Taking? Authorizing Provider  amLODipine (NORVASC) 5 MG tablet Take 1 tablet (5 mg total) by mouth daily. 11/13/19   SaDaleen SquibbMD    Past Medical History:  Diagnosis Date  . 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 Hypertension    was taking amlodipine stopped about 1 yr ago - no insurance - had been on meds since age 30. Pregnancy induced hypertension     Past Surgical History:  Procedure Laterality Date  . CESAREAN SECTION N/A 05/04/2016   Procedure: CESAREAN SECTION;  Surgeon: JoJonnie KindMD;  [facility];  Laterality: N/A;  . LUMBAR PUNCTURE  07/20/2014   fluid build up around spinal column  . NO PAST SURGERIES      Social History  Tobacco Use  . Smoking status: Never Smoker  . Smokeless tobacco: Never Used  Substance Use Topics  . Alcohol use: No    Comment: Rarely (less than once per month)    Family History  Problem Relation Age of Onset  . Diabetes Maternal Grandmother   . Diabetes Paternal Grandmother   . Thyroid disease Mother     Review of Systems  Constitutional: Negative for chills, fever and malaise/fatigue.  Eyes: Negative for blurred vision and double vision.  Respiratory: Negative for cough, shortness of breath and wheezing.   Cardiovascular: Positive for leg swelling. Negative for chest pain and palpitations.  Gastrointestinal: Negative for abdominal pain, blood in stool, constipation, diarrhea, heartburn, nausea and vomiting.  Genitourinary: Negative for dysuria, frequency and hematuria.   Musculoskeletal: Positive for joint pain (Right Shoulder pain). Negative for back pain.       Occasional pain down right leg  Skin: Negative for rash.  Neurological: Positive for headaches (less than once per week). Negative for dizziness and weakness.     OBJECTIVE:  Today's Vitals   12/16/20 0850  BP: 119/78  Pulse: 87  Temp: 98.7 F (37.1 C)  SpO2: 100%  Weight: 211 lb (95.7 kg)  Height: '5\' 3"'  (1.6 m)   Body mass index is 37.38 kg/m.   Physical Exam Vitals reviewed.  Constitutional:      Appearance: Normal appearance.  Cardiovascular:     Rate and Rhythm: Normal rate and regular rhythm.     Pulses: Normal pulses.     Heart sounds: Normal heart sounds. No murmur heard. No friction rub. No gallop.   Pulmonary:     Effort: Pulmonary effort is normal. No respiratory distress.     Breath sounds: Normal breath sounds. No wheezing or rales.  Abdominal:     General: Bowel sounds are normal.     Palpations: Abdomen is soft.  Musculoskeletal:        General: Normal range of motion.     Right shoulder: Normal.     Left shoulder: Normal.     Cervical back: Normal range of motion.     Right lower leg: No edema.     Left lower leg: No edema.  Skin:    General: Skin is warm and dry.     Capillary Refill: Capillary refill takes less than 2 seconds.  Neurological:     General: No focal deficit present.     Mental Status: She is alert and oriented to person, place, and time.  Psychiatric:        Mood and Affect: Mood normal.        Behavior: Behavior normal.     No results found for this or any previous visit (from the past 24 hour(s)).  No results found.   ASSESSMENT and PLAN   Problem List Items Addressed This Visit   None   Visit Diagnoses    Anemia, unspecified type    -  Primary   Relevant Orders   Iron, TIBC and Ferritin Panel   Hepatitis C antibody   Essential hypertension       Relevant Medications   amLODipine (NORVASC) 5 MG tablet    hydrochlorothiazide (HYDRODIURIL) 25 MG tablet   Other Relevant Orders   CMP14+EGFR         Return in about 6 months (around 06/18/2021).   Huston Foley Angeleah Labrake, FNP-BC [facility] [place], [place] [address] Ph.  [phone] Fax [phone]

## 2020-12-24 ENCOUNTER — Other Ambulatory Visit: Payer: Self-pay

## 2020-12-24 ENCOUNTER — Ambulatory Visit: Payer: BC Managed Care – PPO | Admitting: Obstetrics and Gynecology

## 2020-12-24 ENCOUNTER — Encounter: Payer: Self-pay | Admitting: Obstetrics and Gynecology

## 2020-12-24 VITALS — BP 120/70 | HR 68 | Ht 63.0 in | Wt 215.0 lb

## 2020-12-24 DIAGNOSIS — Z8619 Personal history of other infectious and parasitic diseases: Secondary | ICD-10-CM

## 2020-12-24 DIAGNOSIS — Z113 Encounter for screening for infections with a predominantly sexual mode of transmission: Secondary | ICD-10-CM

## 2020-12-24 DIAGNOSIS — R87610 Atypical squamous cells of undetermined significance on cytologic smear of cervix (ASC-US): Secondary | ICD-10-CM | POA: Diagnosis not present

## 2020-12-24 DIAGNOSIS — Z87410 Personal history of cervical dysplasia: Secondary | ICD-10-CM | POA: Diagnosis not present

## 2020-12-24 NOTE — Patient Instructions (Signed)
Trichomoniasis Trichomoniasis is an STI (sexually transmitted infection) that can affect both women and men. In women, the outer area of the female genitalia (vulva) and the vagina are affected. In men, mainly the penis is affected, but the prostate and other reproductive organs can also be involved.  This condition can be treated with medicine. It often has no symptoms (is asymptomatic), especially in men. If not treated, trichomoniasis can last for months or years. What are the causes? This condition is caused by a parasite called Trichomonas vaginalis. Trichomoniasis most often spreads from person to person (is contagious) through sexual contact. What increases the risk? The following factors may make you more likely to develop this condition:  Having unprotected sex.  Having sex with a partner who has trichomoniasis.  Having multiple sexual partners.  Having had previous trichomoniasis infections or other STIs. What are the signs or symptoms? In women, symptoms of trichomoniasis include:  Abnormal vaginal discharge that is clear, white, gray, or yellow-green and foamy and has an unusual "fishy" odor.  Itching and irritation of the vagina and vulva.  Burning or pain during urination or sex.  Redness and swelling of the genitals. In men, symptoms of trichomoniasis include:  Penile discharge that may be foamy or contain pus.  Pain in the penis. This may happen only when urinating.  Itching or irritation inside the penis.  Burning after urination or ejaculation. How is this diagnosed? In women, this condition may be found during a routine Pap test or physical exam. It may be found in men during a routine physical exam. Your health care provider may do tests to help diagnose this infection, such as:  Urine tests (men and women).  The following in women: ? Testing the pH of the vagina. ? A vaginal swab test that checks for the Trichomonas vaginalis parasite. ? Testing vaginal  secretions. Your health care provider may test you for other STIs, including HIV (human immunodeficiency virus). How is this treated? This condition is treated with medicine taken by mouth (orally), such as metronidazole or tinidazole, to fight the infection. Your sexual partner(s) also need to be tested and treated.  If you are a woman and you plan to become pregnant or think you may be pregnant, tell your health care provider right away. Some medicines that are used to treat the infection should not be taken during pregnancy. Your health care provider may recommend over-the-counter medicines or creams to help relieve itching or irritation. You may be tested for infection again 3 months after treatment.   Follow these instructions at home:  Take and use over-the-counter and prescription medicines, including creams, only as told by your health care provider.  Take your antibiotic medicine as told by your health care provider. Do not stop taking the antibiotic even if you start to feel better.  Do not have sex until 7-10 days after you finish your medicine, or until your health care provider approves. Ask your health care provider when you may start to have sex again.  (Women) Do not douche or wear tampons while you have the infection.  Discuss your infection with your sexual partner(s). Make sure that your partner gets tested and treated, if necessary.  Keep all follow-up visits as told by your health care provider. This is important. How is this prevented?  Use condoms every time you have sex. Using condoms correctly and consistently can help protect against STIs.  Avoid having multiple sexual partners.  Talk with your sexual partner about   any symptoms that either of you may have, as well as any history of STIs.  Get tested for STIs and STDs (sexually transmitted diseases) before you have sex. Ask your partner to do the same.  Do not have sexual contact if you have symptoms of  trichomoniasis or another STI.   Contact a health care provider if:  You still have symptoms after you finish your medicine.  You develop pain in your abdomen.  You have pain when you urinate.  You have bleeding after sex.  You develop a rash.  You feel nauseous or you vomit.  You plan to become pregnant or think you may be pregnant. Summary  Trichomoniasis is an STI (sexually transmitted infection) that can affect both women and men.  This condition often has no symptoms (is asymptomatic), especially in men.  Without treatment, this condition can last for months or years.  You should not have sex until 7-10 days after you finish your medicine, or until your health care provider approves. Ask your health care provider when you may start to have sex again.  Discuss your infection with your sexual partner(s). Make sure that your partner gets tested and treated, if necessary. This information is not intended to replace advice given to you by your health care provider. Make sure you discuss any questions you have with your health care provider. Document Revised: 07/02/2018 Document Reviewed: 07/02/2018 Elsevier Patient Education  2021 Elsevier Inc.  

## 2020-12-24 NOTE — Progress Notes (Signed)
30 y.o. G1P0101 Single Black or African American Not Hispanic or Latino female here as a new patient for an abnormal pap.   The patient's pap from 10/14/20 returned with ASCUS, + trich, negative hpv, negative GC/CT, HSV. She was treated with 2 grams of Flagyl. She hasn't been with that partner.  Prior pap in 10/20 negative with negative hpv Pap in 5/19 was ASCUS with +HPV (age 74) colpo biopsy in 6/19 was CIN I (age 27)  Period Cycle (Days): 59 Period Duration (Days): 5-7 Period Pattern: Regular Menstrual Flow: Light,Moderate Menstrual Control: Maxi pad Menstrual Control Change Freq (Hours): 4 Dysmenorrhea: (!) Mild Dysmenorrhea Symptoms: Headache,Cramping (breast tenderness, back pain, increase in appetite)  Patient's last menstrual period was 12/02/2020 (approximate).          Sexually active: Yes.    The current method of family planning is Nexplanon and condoms most of the time.    Exercising: No.  The patient does not participate in regular exercise at present. Smoker:  Yes, Marijuana  Health Maintenance: Pap:  10/14/20 ASCUS:Neg HR HPV History of abnormal Pap:  Yes, colpo done in 2019 TDaP:  04/24/16 Gardasil: completed   reports that she has never smoked. She has never used smokeless tobacco. She reports current drug use. Drug: Marijuana. She reports that she does not drink alcohol.  Past Medical History:  Diagnosis Date  . 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 Hypertension    was taking amlodipine stopped about 1 yr ago - no insurance - had been on meds since age 39  . Pregnancy induced hypertension     Past Surgical History:  Procedure Laterality Date  . CESAREAN SECTION N/A 05/04/2016   Procedure: CESAREAN SECTION;  Surgeon: Tilda Burrow, MD;  Location: [facility];  Service: Obstetrics;  Laterality: N/A;  . LUMBAR PUNCTURE  07/20/2014   fluid build up around spinal column  . NO PAST SURGERIES       Current Outpatient Medications  Medication Sig Dispense Refill  . amLODipine (NORVASC) 5 MG tablet Take 1 tablet (5 mg total) by mouth daily. 90 tablet 3  . hydrochlorothiazide (HYDRODIURIL) 25 MG tablet Take 1 tablet (25 mg total) by mouth daily. 90 tablet 3   Current Facility-Administered Medications  Medication Dose Route Frequency Provider Last Rate Last Admin  . etonogestrel (NEXPLANON) implant 68 mg  68 mg Subdermal Once Anyanwu, Jethro Bastos, MD        Family History  Problem Relation Age of Onset  . Diabetes Maternal Grandmother   . Diabetes Paternal Grandmother   . Thyroid disease Mother     Review of Systems  Constitutional: Negative.   HENT: Negative.   Eyes: Negative.   Respiratory: Negative.   Cardiovascular: Negative.   Gastrointestinal: Negative.   Endocrine: Negative.   Genitourinary: Negative.   Musculoskeletal: Negative.   Skin: Negative.   Allergic/Immunologic: Negative.   Neurological: Negative.   Hematological: Negative.   Psychiatric/Behavioral: Negative.     Exam:   BP 120/70   Pulse 68   Ht 5\' 3"  (1.6 m)   Wt 215 lb (97.5 kg)   LMP 12/02/2020 (Approximate)   BMI 38.09 kg/m   Weight change: @WEIGHTCHANGE @ Height:   Height: 5\' 3"  (160 cm)  Ht Readings from Last 3 Encounters:  12/24/20 5\' 3"  (1.6 m)  12/16/20 5\' 3"  (1.6 m)  09/16/20 5\' 3"  (1.6 m)    General appearance: alert, cooperative and  appears stated age   Pelvic: External genitalia:  no lesions              Urethra:  normal appearing urethra with no masses, tenderness or lesions              Bartholins and Skenes: normal                 Vagina: normal appearing vagina with normal color and discharge, no lesions              Cervix: no lesions  1. Atypical squamous cell changes of undetermined significance (ASCUS) on cervical cytology with negative high risk human papilloma virus (HPV) test result Reviewed the patient's pap history, h/o CIN I, prior normal pap and current pap.   Per ASCCP guidelines the recommendation is to repeat her pap in 3 years  2. History of cervical dysplasia CIN I in 6/19  3. History of trichomoniasis Treated in 1/22 Repeat testing today  4. Screening examination for STD (sexually transmitted disease) - HIV Antibody (routine testing w rflx) - RPR - Hepatitis C antibody - SureSwab, Vaginosis/Vaginitis Plus -Condom use encouraged

## 2020-12-27 LAB — HEPATITIS C ANTIBODY
Hepatitis C Ab: NONREACTIVE
SIGNAL TO CUT-OFF: 0.02 (ref <1.00)

## 2020-12-27 LAB — RPR: RPR Ser Ql: NONREACTIVE

## 2020-12-27 LAB — HIV ANTIBODY (ROUTINE TESTING W REFLEX): HIV 1&2 Ab, 4th Generation: NONREACTIVE

## 2020-12-28 LAB — SURESWAB, VAGINOSIS/VAGINITIS PLUS
Atopobium vaginae: NOT DETECTED Log (cells/mL)
BV CATEGORY: UNDETERMINED — AB
C. albicans, DNA: NOT DETECTED
C. glabrata, DNA: NOT DETECTED
C. parapsilosis, DNA: NOT DETECTED
C. trachomatis RNA, TMA: NOT DETECTED
C. tropicalis, DNA: NOT DETECTED
Gardnerella vaginalis: 6.4 Log (cells/mL)
LACTOBACILLUS SPECIES: 7 Log (cells/mL)
MEGASPHAERA SPECIES: NOT DETECTED Log (cells/mL)
N. gonorrhoeae RNA, TMA: NOT DETECTED
Trichomonas vaginalis RNA: NOT DETECTED

## 2021-09-06 ENCOUNTER — Other Ambulatory Visit: Payer: Self-pay

## 2021-09-06 ENCOUNTER — Encounter (HOSPITAL_COMMUNITY): Payer: Self-pay

## 2021-09-06 ENCOUNTER — Emergency Department (HOSPITAL_COMMUNITY)
Admission: EM | Admit: 2021-09-06 | Discharge: 2021-09-07 | Disposition: A | Discharge status: Home / Self Care | Payer: BC Managed Care – PPO | Attending: Emergency Medicine | Admitting: Emergency Medicine

## 2021-09-06 ENCOUNTER — Emergency Department (HOSPITAL_COMMUNITY): Payer: BC Managed Care – PPO

## 2021-09-06 DIAGNOSIS — N83201 Unspecified ovarian cyst, right side: Secondary | ICD-10-CM | POA: Diagnosis not present

## 2021-09-06 DIAGNOSIS — I10 Essential (primary) hypertension: Secondary | ICD-10-CM | POA: Insufficient documentation

## 2021-09-06 DIAGNOSIS — K439 Ventral hernia without obstruction or gangrene: Secondary | ICD-10-CM | POA: Diagnosis not present

## 2021-09-06 DIAGNOSIS — Z79899 Other long term (current) drug therapy: Secondary | ICD-10-CM | POA: Insufficient documentation

## 2021-09-06 DIAGNOSIS — N200 Calculus of kidney: Secondary | ICD-10-CM | POA: Diagnosis not present

## 2021-09-06 DIAGNOSIS — K8020 Calculus of gallbladder without cholecystitis without obstruction: Secondary | ICD-10-CM | POA: Insufficient documentation

## 2021-09-06 DIAGNOSIS — N12 Tubulo-interstitial nephritis, not specified as acute or chronic: Secondary | ICD-10-CM

## 2021-09-06 DIAGNOSIS — R109 Unspecified abdominal pain: Secondary | ICD-10-CM | POA: Diagnosis not present

## 2021-09-06 DIAGNOSIS — K429 Umbilical hernia without obstruction or gangrene: Secondary | ICD-10-CM | POA: Diagnosis not present

## 2021-09-06 LAB — COMPREHENSIVE METABOLIC PANEL
ALT: 24 U/L (ref 0–44)
AST: 21 U/L (ref 15–41)
Albumin: 4 g/dL (ref 3.5–5.0)
Alkaline Phosphatase: 77 U/L (ref 38–126)
Anion gap: 11 (ref 5–15)
BUN: 15 mg/dL (ref 6–20)
CO2: 21 mmol/L — ABNORMAL LOW (ref 22–32)
Calcium: 9.5 mg/dL (ref 8.9–10.3)
Chloride: 103 mmol/L (ref 98–111)
Creatinine, Ser: 1.44 mg/dL — ABNORMAL HIGH (ref 0.44–1.00)
GFR, Estimated: 50 mL/min — ABNORMAL LOW (ref >60)
Glucose, Bld: 92 mg/dL (ref 70–99)
Potassium: 3.9 mmol/L (ref 3.5–5.1)
Sodium: 135 mmol/L (ref 135–145)
Total Bilirubin: 0.8 mg/dL (ref 0.3–1.2)
Total Protein: 8 g/dL (ref 6.5–8.1)

## 2021-09-06 LAB — URINALYSIS, ROUTINE W REFLEX MICROSCOPIC
Bilirubin Urine: NEGATIVE
Glucose, UA: NEGATIVE mg/dL
Ketones, ur: 15 mg/dL — AB
Nitrite: NEGATIVE
Protein, ur: NEGATIVE mg/dL
Specific Gravity, Urine: 1.02 (ref 1.005–1.030)
pH: 6 (ref 5.0–8.0)

## 2021-09-06 LAB — CBC WITH DIFFERENTIAL/PLATELET
Abs Immature Granulocytes: 0.01 10*3/uL (ref 0.00–0.07)
Basophils Absolute: 0 10*3/uL (ref 0.0–0.1)
Basophils Relative: 0 %
Eosinophils Absolute: 0 10*3/uL (ref 0.0–0.5)
Eosinophils Relative: 0 %
HCT: 35.4 % — ABNORMAL LOW (ref 36.0–46.0)
Hemoglobin: 11.7 g/dL — ABNORMAL LOW (ref 12.0–15.0)
Immature Granulocytes: 0 %
Lymphocytes Relative: 22 %
Lymphs Abs: 1.9 10*3/uL (ref 0.7–4.0)
MCH: 32.4 pg (ref 26.0–34.0)
MCHC: 33.1 g/dL (ref 30.0–36.0)
MCV: 98.1 fL (ref 80.0–100.0)
Monocytes Absolute: 0.5 10*3/uL (ref 0.1–1.0)
Monocytes Relative: 6 %
Neutro Abs: 6.2 10*3/uL (ref 1.7–7.7)
Neutrophils Relative %: 72 %
Platelets: 311 10*3/uL (ref 150–400)
RBC: 3.61 MIL/uL — ABNORMAL LOW (ref 3.87–5.11)
RDW: 12.5 % (ref 11.5–15.5)
WBC: 8.6 10*3/uL (ref 4.0–10.5)
nRBC: 0 % (ref 0.0–0.2)

## 2021-09-06 LAB — I-STAT BETA HCG BLOOD, ED (MC, WL, AP ONLY): I-stat hCG, quantitative: <5 m[IU]/mL (ref <5)

## 2021-09-06 LAB — URINALYSIS, MICROSCOPIC (REFLEX): Bacteria, UA: NONE SEEN

## 2021-09-06 LAB — LIPASE, BLOOD: Lipase: 23 U/L (ref 11–51)

## 2021-09-06 MED ORDER — LACTATED RINGERS IV BOLUS
2000.0000 mL | Freq: Once | INTRAVENOUS | Status: AC
  Administered 2021-09-07: 2000 mL via INTRAVENOUS

## 2021-09-06 MED ORDER — FENTANYL CITRATE PF 50 MCG/ML IJ SOSY
100.0000 ug | PREFILLED_SYRINGE | Freq: Once | INTRAMUSCULAR | Status: AC
  Administered 2021-09-07: 100 ug via INTRAVENOUS
  Filled 2021-09-06: qty 2

## 2021-09-06 MED ORDER — SODIUM CHLORIDE 0.9 % IV SOLN
1.0000 g | Freq: Once | INTRAVENOUS | Status: AC
  Administered 2021-09-07: 1 g via INTRAVENOUS
  Filled 2021-09-06: qty 10

## 2021-09-06 NOTE — ED Provider Notes (Signed)
[facility] EMERGENCY DEPARTMENT Provider Note   CSN: [id number] Arrival date & time: 09/06/21  1747     History No chief complaint on file.   Patty Gilmore is a 30 y.o. female.  The history is provided by the patient.  Flank Pain This is a new problem. The current episode started 12 to 24 hours ago. The problem occurs constantly. The problem has been gradually worsening. Associated symptoms include abdominal pain. Pertinent negatives include no chest pain and no shortness of breath. Exacerbated by: movement. Nothing relieves the symptoms.  Patient reports onset of right flank pain last night that radiates into her abdomen She reports similar type pain several years ago when she had "kidney infection" No fevers at this time.  No chest pain or shortness of breath, but she reports with deep breathing it hurts her low back. No lower extremity weakness is reported. 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   states she had "thickened muscle around her heart"   Hypertension    was taking amlodipine stopped about 1 yr ago - no insurance - had been on meds since age 7   Pregnancy induced hypertension     Patient Active Problem List   Diagnosis Date Noted   Sorethroat 04/17/2018   Acute pharyngitis 04/17/2018   Lower abdominal pain 01/29/2018   Acanthosis nigricans 01/16/2012   LVH (left ventricular hypertrophy) 01/16/2012    Past Surgical History:  Procedure Laterality Date   CESAREAN SECTION N/A 05/04/2016   Procedure: CESAREAN SECTION;  Surgeon: Tilda Burrow, MD;  Location: [facility] BIRTHING SUITES;  Service: Obstetrics;  Laterality: N/A;   LUMBAR PUNCTURE  07/20/2014   fluid build up around spinal column   NO PAST SURGERIES       OB History     Gravida  1   Para  1   Term  0   Preterm  1   AB  0   Living  1      SAB  0   IAB  0   Ectopic  0   Multiple   0   Live Births  1           Family History  Problem Relation Age of Onset   Diabetes Maternal Grandmother    Diabetes Paternal Grandmother    Thyroid disease Mother     Social History   Tobacco Use   Smoking status: Never   Smokeless tobacco: Never  Vaping Use   Vaping Use: Never used  Substance Use Topics   Alcohol use: No    Comment: Rarely (less than once per month)   Drug use: Yes    Types: Marijuana    Home Medications Prior to Admission medications   Medication Sig Start Date End Date Taking? Authorizing Provider  acetaminophen (TYLENOL) 500 MG tablet Take 500 mg by mouth every 6 (six) hours as needed for moderate pain.   Yes [provider]  amLODipine (NORVASC) 5 MG tablet Take 1 tablet (5 mg total) by mouth daily. 12/16/20  Yes Just, Azalee Course, FNP  hydrochlorothiazide (HYDRODIURIL) 25 MG tablet Take 1 tablet (25 mg total) by mouth daily. 12/16/20  Yes Just, Azalee Course, FNP    Allergies    Patient has no known allergies.  Review of Systems   Review of Systems  Constitutional:  Negative for fever.  Respiratory:  Negative for cough and  shortness of breath.   Cardiovascular:  Negative for chest pain.  Gastrointestinal:  Positive for abdominal pain. Negative for vomiting.  Genitourinary:  Positive for flank pain. Negative for dysuria, hematuria, vaginal bleeding and vaginal discharge.  Neurological:  Negative for weakness.  All other systems reviewed and are negative.  Physical Exam Updated Vital Signs BP 131/84 (BP Location: Left Arm)   Pulse (!) 104   Temp 99 F (37.2 C)   Resp 15   SpO2 100%   Physical Exam CONSTITUTIONAL: Well developed/well nourished, uncomfortable. HEAD: Normocephalic/atraumatic EYES: EOMI/PERRL ENMT: Mucous membranes moist NECK: supple no meningeal signs SPINE/BACK:entire spine nontender CV: S1/S2 noted, no murmurs/rubs/gallops noted LUNGS: Lungs are clear to auscultation bilaterally, no apparent distress ABDOMEN:  soft, mild suprapubic tenderness, no rebound or guarding, bowel sounds noted throughout abdomen, no other focal abdominal tenderness GU: Right cva tenderness, no rash or bruising NEURO: Pt is awake/alert/appropriate, moves all extremitiesx4.  No facial droop.  Moves all extremities without difficulty, no focal weakness EXTREMITIES: pulses normal/equal, full ROM SKIN: warm, color normal PSYCH: no abnormalities of mood noted, alert and oriented to situation  ED Results / Procedures / Treatments   Labs (all labs ordered are listed, but only abnormal results are displayed) Labs Reviewed  CBC WITH DIFFERENTIAL/PLATELET - Abnormal; Notable for the following components:      Result Value   RBC 3.61 (*)    Hemoglobin 11.7 (*)    HCT 35.4 (*)    All other components within normal limits  COMPREHENSIVE METABOLIC PANEL - Abnormal; Notable for the following components:   CO2 21 (*)    Creatinine, Ser 1.44 (*)    GFR, Estimated 50 (*)    All other components within normal limits  URINALYSIS, ROUTINE W REFLEX MICROSCOPIC - Abnormal; Notable for the following components:   Hgb urine dipstick TRACE (*)    Ketones, ur 15 (*)    Leukocytes,Ua TRACE (*)    All other components within normal limits  URINE CULTURE  LIPASE, BLOOD  URINALYSIS, MICROSCOPIC (REFLEX)  I-STAT BETA HCG BLOOD, ED ([facility], [facility], [facility] ONLY)    EKG None  Radiology CT Renal Stone Study  Result Date: 09/06/2021 CLINICAL DATA:  Right flank pain. EXAM: CT ABDOMEN AND PELVIS WITHOUT CONTRAST TECHNIQUE: Multidetector CT imaging of the abdomen and pelvis was performed following the standard protocol without IV contrast. COMPARISON:  CT renal stone 11/30/2017. FINDINGS: Lower chest: No acute abnormality. Hepatobiliary: Gallstones are present. There is no biliary ductal dilatation. Liver is within normal limits. Pancreas: Unremarkable. No pancreatic ductal dilatation or surrounding inflammatory changes. Spleen: Normal in size without focal  abnormality. Adrenals/Urinary Tract: There is mild right-sided pelvicaliectasis with minimal stranding surrounding the right renal pelvis. No urinary tract calculi are seen. Kidneys otherwise appear within normal limits. The bladder is within normal limits. Adrenal glands are within normal limits. Stomach/Bowel: Stomach is within normal limits. Appendix appears normal. No evidence of bowel wall thickening, distention, or inflammatory changes. Vascular/Lymphatic: No significant vascular findings are present. No enlarged abdominal or pelvic lymph nodes. Reproductive: 3.0 cm cyst in the right ovary. Left ovary and uterus are within normal limits. Other: There is a small amount of free fluid in the pelvis. There is a small fat containing umbilical hernia and a small fat containing supraumbilical ventral hernia. Musculoskeletal: No acute or significant osseous findings. IMPRESSION: 1. Mild right-sided pelvicaliectasis with minimal stranding surrounding the right renal pelvis. No urinary tract calculus identified. Findings can be seen in the setting of  infection or recently passed calculus(nonvisualized). 2. 3 cm right ovarian simple-appearing cyst. No follow-up imaging is recommended. Reference: JACR 2020 Feb;17(2):248-254 3. Small amount of free fluid in the pelvis. 4. Cholelithiasis. Electronically Signed   By: Ronney Asters M.D.   On: 09/06/2021 19:37    Procedures Procedures   Medications Ordered in ED Medications  cefTRIAXone (ROCEPHIN) 1 g in sodium chloride 0.9 % 100 mL IVPB (0 g Intravenous Stopped 09/07/21 0109)  fentaNYL (SUBLIMAZE) injection 100 mcg (100 mcg Intravenous Given 09/07/21 0022)  lactated ringers bolus 2,000 mL (0 mLs Intravenous Stopped 09/07/21 0408)  HYDROcodone-acetaminophen (NORCO/VICODIN) 5-325 MG per tablet 1 tablet (1 tablet Oral Given 09/07/21 0152)  HYDROcodone-acetaminophen (NORCO/VICODIN) 5-325 MG per tablet 1 tablet (1 tablet Oral Given 09/07/21 0422)    ED Course  I have  reviewed the triage vital signs and the nursing notes.  Pertinent labs & imaging results that were available during my care of the patient were reviewed by me and considered in my medical decision making (see chart for details).    MDM Rules/Calculators/A&P                           This patient presents to the ED for concern of flank pain, this involves an extensive number of treatment options, and is a complaint that carries with it a high risk of complications and morbidity.  The differential diagnosis includes kidney stone, pyelonephritis, musculoskeletal strain, urinary tract infection, cholelithiasis ovarian cyst   Lab Tests:  I Ordered, reviewed, and interpreted labs, which included urinalysis, metabolic panel, complete blood  Medicines ordered:  I ordered medication Rocephin for pyelonephritis  Imaging Studies ordered:  I ordered imaging studies which included CT abdomen pelvis  I independently visualized and interpreted imaging which showed pyelonephritis, cholelithiasis, simple ovarian cyst  Additional history obtained:  Previous records obtained and reviewed   Reevaluation:  After the interventions stated above, I reevaluated the patient and found patient feels improved She is in no acute distress, reports continued right flank pain but otherwise feeling improved.  Denies any chest pain or shortness of breath. She  reports this feels similar to prior episode of pyelonephritis.  Since patient is feeling improved, she is appropriate for outpatient management We discussed strict return precautions  Final Clinical Impression(s) / ED Diagnoses Final diagnoses:  Pyelonephritis  Calculus of gallbladder without cholecystitis without obstruction  Cyst of right ovary    Rx / DC Orders ED Discharge Orders          Ordered    cephALEXin (KEFLEX) 500 MG capsule  4 times daily        09/07/21 0317    HYDROcodone-acetaminophen (NORCO/VICODIN) 5-325 MG tablet  Every 6  hours PRN        09/07/21 0318             Ripley Fraise, MD 09/07/21 [phone]

## 2021-09-06 NOTE — ED Triage Notes (Addendum)
Patient complains of right flank pain that started last night with radiation across front of abdomen. Reports that she cannot get comfortable. No nausea, no vomiting, denies diarrhea. Denies dysuria. Alert and oriented Patient also reports intermittent right leg pain around the time of her menstrual cycle. None today

## 2021-09-06 NOTE — ED Provider Notes (Signed)
Emergency Medicine Provider Triage Evaluation Note  Patty Gilmore , a 30 y.o. female  was evaluated in triage.  Pt complains of right flank pain that radiates to abdomen that started last night.  Denies associated nausea, vomiting, and diarrhea.  No vaginal or urinary symptoms.  Admits to chills however, denies fever.  No history of kidney stones.  No trauma to flank or abdomen.   Review of Systems  Positive: Flank pain, abdominal pain Negative: fever  Physical Exam  BP (!) 147/95   Pulse (!) 118   Temp 99.5 F (37.5 C)   Resp 16   SpO2 100%  Gen:   Awake, no distress   Resp:  Normal effort  MSK:   Moves extremities without difficulty  Other:  +right CVA tenderness  Medical Decision Making  Medically screening exam initiated at 6:05 PM.  Appropriate orders placed.  Patty Gilmore was informed that the remainder of the evaluation will be completed by another provider, this initial triage assessment does not replace that evaluation, and the importance of remaining in the ED until their evaluation is complete.  Right flank pain that radiated to abdomen. + right CVA tenderness. Possible kidney stone vs. Pyelonephritis. Labs ordered. CT renal study.    Jesusita Oka 09/06/21 1806    Gwyneth Sprout, MD 09/06/21 2137

## 2021-09-07 ENCOUNTER — Encounter (HOSPITAL_COMMUNITY): Payer: Self-pay

## 2021-09-07 MED ORDER — HYDROCODONE-ACETAMINOPHEN 5-325 MG PO TABS: 1.0000 | ORAL_TABLET | Freq: Four times a day (QID) | ORAL | 0 refills | Status: DC | PRN

## 2021-09-07 MED ORDER — HYDROCODONE-ACETAMINOPHEN 5-325 MG PO TABS
1.0000 | ORAL_TABLET | Freq: Once | ORAL | Status: AC
  Administered 2021-09-07: 1 via ORAL
  Filled 2021-09-07: qty 1

## 2021-09-07 MED ORDER — CEPHALEXIN 500 MG PO CAPS: 500.0000 mg | ORAL_CAPSULE | Freq: Four times a day (QID) | ORAL | 0 refills | Status: DC

## 2021-09-09 LAB — URINE CULTURE: Culture: >=100000 — AB

## 2021-09-10 ENCOUNTER — Telehealth: Payer: Self-pay | Admitting: Emergency Medicine

## 2021-09-10 NOTE — Telephone Encounter (Signed)
Post ED Visit - Positive Culture Follow-up  Culture report reviewed by antimicrobial stewardship pharmacist: [facility] []  , Pharm.D. []  Enzo Bi, Pharm.D., BCPS AQ-ID []  , Pharm.D., BCPS []  Celedonio Miyamoto, Pharm.D., BCPS []  [place], Garvin Fila.D., BCPS, AAHIVP []  , Pharm.D., BCPS, AAHIVP []  Georgina Pillion, PharmD, BCPS []  , PharmD, BCPS []  [place], PharmD, BCPS []  [place], PharmD []  , PharmD, BCPS [x]  Estella Husk, PharmD  Pharmacy Team []  Lysle Pearl, PharmD []  , PharmD []  Phillips Climes, PharmD []  , Rph []  Agapito Games) , PharmD []  Verlan Friends, PharmD []  , PharmD []  Mervyn Gay, PharmD []  , PharmD []  Vinnie Level, PharmD []  Wonda Olds, PharmD []  , PharmD []  Len Childs, PharmD   Positive urine culture Treated with Cephalexin, organism sensitive to the same and no further patient follow-up is required at this time.  Mani Celestin 09/10/2021, 3:07 PM

## 2021-11-10 NOTE — Telephone Encounter (Signed)
Erroneous encounter. Please disregard.

## 2021-11-24 ENCOUNTER — Ambulatory Visit (INDEPENDENT_AMBULATORY_CARE_PROVIDER_SITE_OTHER): Payer: Self-pay | Admitting: Nurse Practitioner

## 2021-11-24 ENCOUNTER — Encounter: Payer: Self-pay | Admitting: Nurse Practitioner

## 2021-11-24 ENCOUNTER — Other Ambulatory Visit: Payer: Self-pay

## 2021-11-24 VITALS — BP 130/90 | HR 85 | Temp 97.5°F | Resp 18 | Wt 215.0 lb

## 2021-11-24 DIAGNOSIS — Z8249 Family history of ischemic heart disease and other diseases of the circulatory system: Secondary | ICD-10-CM | POA: Insufficient documentation

## 2021-11-24 DIAGNOSIS — I10 Essential (primary) hypertension: Secondary | ICD-10-CM

## 2021-11-24 DIAGNOSIS — N183 Chronic kidney disease, stage 3 unspecified: Secondary | ICD-10-CM | POA: Insufficient documentation

## 2021-11-24 DIAGNOSIS — F411 Generalized anxiety disorder: Secondary | ICD-10-CM

## 2021-11-24 DIAGNOSIS — N182 Chronic kidney disease, stage 2 (mild): Secondary | ICD-10-CM

## 2021-11-24 DIAGNOSIS — L83 Acanthosis nigricans: Secondary | ICD-10-CM

## 2021-11-24 LAB — BASIC METABOLIC PANEL
BUN: 16 mg/dL (ref 6–23)
CO2: 26 mEq/L (ref 19–32)
Calcium: 9.7 mg/dL (ref 8.4–10.5)
Chloride: 106 mEq/L (ref 96–112)
Creatinine, Ser: 1.43 mg/dL — ABNORMAL HIGH (ref 0.40–1.20)
GFR: 49.55 mL/min — ABNORMAL LOW (ref >60.00)
Glucose, Bld: 93 mg/dL (ref 70–99)
Potassium: 4.7 mEq/L (ref 3.5–5.1)
Sodium: 138 mEq/L (ref 135–145)

## 2021-11-24 LAB — HEMOGLOBIN A1C: Hgb A1c MFr Bld: 5.7 % (ref 4.6–6.5)

## 2021-11-24 MED ORDER — AMLODIPINE BESYLATE 5 MG PO TABS: 5.0000 mg | ORAL_TABLET | Freq: Every day | ORAL | 3 refills | Status: DC

## 2021-11-24 MED ORDER — HYDROCHLOROTHIAZIDE 25 MG PO TABS: 25.0000 mg | ORAL_TABLET | Freq: Every day | ORAL | 3 refills | Status: DC

## 2021-11-24 NOTE — Assessment & Plan Note (Signed)
Hyperpigmentation on neck, hair growth of chin and neck Advised about importance of heart healthy diet and regular exercise to promote weight loss. We discussed switching HCTZ to spironolactone. She declined Repeat hgbA1c

## 2021-11-24 NOTE — Patient Instructions (Addendum)
Alive or nature made multivitamin brand.  Thank you for choosing [facility]  Go to lab for blood draw and urine collection  DASH Eating Plan DASH stands for Dietary Approaches to Stop Hypertension. The DASH eating plan is a healthy eating plan that has been shown to: Reduce high blood pressure (hypertension). Reduce your risk for type 2 diabetes, heart disease, and stroke. Help with weight loss. What are tips for following this plan? Reading food labels Check food labels for the amount of salt (sodium) per serving. Choose foods with less than 5 percent of the Daily Value of sodium. Generally, foods with less than 300 milligrams (mg) of sodium per serving fit into this eating plan. To find whole grains, look for the word "whole" as the first word in the ingredient list. Shopping Buy products labeled as "low-sodium" or "no salt added." Buy fresh foods. Avoid canned foods and pre-made or frozen meals. Cooking Avoid adding salt when cooking. Use salt-free seasonings or herbs instead of table salt or sea salt. Check with your health care provider or pharmacist before using salt substitutes. Do not fry foods. Cook foods using healthy methods such as baking, boiling, grilling, roasting, and broiling instead. Cook with heart-healthy oils, such as olive, canola, avocado, soybean, or sunflower oil. Meal planning  Eat a balanced diet that includes: 4 or more servings of fruits and 4 or more servings of vegetables each day. Try to fill one-half of your plate with fruits and vegetables. 6-8 servings of whole grains each day. Less than 6 oz (170 g) of lean meat, poultry, or fish each day. A 3-oz (85-g) serving of meat is about the same size as a deck of cards. One egg equals 1 oz (28 g). 2-3 servings of low-fat dairy each day. One serving is 1 cup (237 mL). 1 serving of nuts, seeds, or beans 5 times each week. 2-3 servings of heart-healthy fats. Healthy fats called omega-3 fatty acids are  found in foods such as walnuts, flaxseeds, fortified milks, and eggs. These fats are also found in cold-water fish, such as sardines, salmon, and mackerel. Limit how much you eat of: Canned or prepackaged foods. Food that is high in trans fat, such as some fried foods. Food that is high in saturated fat, such as fatty meat. Desserts and other sweets, sugary drinks, and other foods with added sugar. Full-fat dairy products. Do not salt foods before eating. Do not eat more than 4 egg yolks a week. Try to eat at least 2 vegetarian meals a week. Eat more home-cooked food and less restaurant, buffet, and fast food. Lifestyle When eating at a restaurant, ask that your food be prepared with less salt or no salt, if possible. If you drink alcohol: Limit how much you use to: 0-1 drink a day for women who are not pregnant. 0-2 drinks a day for men. Be aware of how much alcohol is in your drink. In the [place] one drink equals one 12 oz bottle of beer (355 mL), one 5 oz glass of wine (148 mL), or one 1 oz glass of hard liquor (44 mL). General information Avoid eating more than 2,300 mg of salt a day. If you have hypertension, you may need to reduce your sodium intake to 1,500 mg a day. Work with your health care provider to maintain a healthy body weight or to lose weight. Ask what an ideal weight is for you. Get at least 30 minutes of exercise that causes your heart to  beat faster (aerobic exercise) most days of the week. Activities may include walking, swimming, or biking. Work with your health care provider or dietitian to adjust your eating plan to your individual calorie needs. What foods should I eat? Fruits All fresh, dried, or frozen fruit. Canned fruit in natural juice (without added sugar). Vegetables Fresh or frozen vegetables (raw, steamed, roasted, or grilled). Low-sodium or reduced-sodium tomato and vegetable juice. Low-sodium or reduced-sodium tomato sauce and tomato paste. Low-sodium  or reduced-sodium canned vegetables. Grains Whole-grain or whole-wheat bread. Whole-grain or whole-wheat pasta. Brown rice. Orpah Cobb. Bulgur. Whole-grain and low-sodium cereals. Pita bread. Low-fat, low-sodium crackers. Whole-wheat flour tortillas. Meats and other proteins Skinless chicken or [place]. Ground chicken or [place]. Pork with fat trimmed off. Fish and seafood. Egg whites. Dried beans, peas, or lentils. Unsalted nuts, nut butters, and seeds. Unsalted canned beans. Lean cuts of beef with fat trimmed off. Low-sodium, lean precooked or cured meat, such as sausages or meat loaves. Dairy Low-fat (1%) or fat-free (skim) milk. Reduced-fat, low-fat, or fat-free cheeses. Nonfat, low-sodium ricotta or cottage cheese. Low-fat or nonfat yogurt. Low-fat, low-sodium cheese. Fats and oils Soft margarine without trans fats. Vegetable oil. Reduced-fat, low-fat, or light mayonnaise and salad dressings (reduced-sodium). Canola, safflower, olive, avocado, soybean, and sunflower oils. Avocado. Seasonings and condiments Herbs. Spices. Seasoning mixes without salt. Other foods Unsalted popcorn and pretzels. Fat-free sweets. The items listed above may not be a complete list of foods and beverages you can eat. Contact a dietitian for more information. What foods should I avoid? Fruits Canned fruit in a light or heavy syrup. Fried fruit. Fruit in cream or butter sauce. Vegetables Creamed or fried vegetables. Vegetables in a cheese sauce. Regular canned vegetables (not low-sodium or reduced-sodium). Regular canned tomato sauce and paste (not low-sodium or reduced-sodium). Regular tomato and vegetable juice (not low-sodium or reduced-sodium). Rosita Fire. Olives. Grains Baked goods made with fat, such as croissants, muffins, or some breads. Dry pasta or rice meal packs. Meats and other proteins Fatty cuts of meat. Ribs. Fried meat. Tomasa Blase. Bologna, salami, and other precooked or cured meats, such as sausages or  meat loaves. Fat from the back of a pig (fatback). Bratwurst. Salted nuts and seeds. Canned beans with added salt. Canned or smoked fish. Whole eggs or egg yolks. Chicken or [place] with skin. Dairy Whole or 2% milk, cream, and half-and-half. Whole or full-fat cream cheese. Whole-fat or sweetened yogurt. Full-fat cheese. Nondairy creamers. Whipped toppings. Processed cheese and cheese spreads. Fats and oils Butter. Stick margarine. Lard. Shortening. Ghee. Bacon fat. Tropical oils, such as coconut, palm kernel, or palm oil. Seasonings and condiments Onion salt, garlic salt, seasoned salt, table salt, and sea salt. Worcestershire sauce. Tartar sauce. Barbecue sauce. Teriyaki sauce. Soy sauce, including reduced-sodium. Steak sauce. Canned and packaged gravies. Fish sauce. Oyster sauce. Cocktail sauce. Store-bought horseradish. Ketchup. Mustard. Meat flavorings and tenderizers. Bouillon cubes. Hot sauces. Pre-made or packaged marinades. Pre-made or packaged taco seasonings. Relishes. Regular salad dressings. Other foods Salted popcorn and pretzels. The items listed above may not be a complete list of foods and beverages you should avoid. Contact a dietitian for more information. Where to find more information National Heart, Lung, and Blood Institute: PopSteam.is American Heart Association: www.heart.org Academy of Nutrition and Dietetics: www.eatright.org National Kidney Foundation: www.kidney.org Summary The DASH eating plan is a healthy eating plan that has been shown to reduce high blood pressure (hypertension). It may also reduce your risk for type 2 diabetes, heart disease, and stroke. When  on the DASH eating plan, aim to eat more fresh fruits and vegetables, whole grains, lean proteins, low-fat dairy, and heart-healthy fats. With the DASH eating plan, you should limit salt (sodium) intake to 2,300 mg a day. If you have hypertension, you may need to reduce your sodium intake to 1,500 mg a  day. Work with your health care provider or dietitian to adjust your eating plan to your individual calorie needs. This information is not intended to replace advice given to you by your health care provider. Make sure you discuss any questions you have with your health care provider. Document Revised: 08/22/2019 Document Reviewed: 08/22/2019 Elsevier Patient Education  2022 [employer].

## 2021-11-24 NOTE — Assessment & Plan Note (Addendum)
Onset of HTN in 6th grade Diet: low sodium diet. Execise: walking daily and resistant training. BP at goal with amlodipine and HCTZ nexplanon for contraception BP Readings from Last 3 Encounters:  11/24/21 130/90  09/07/21 138/76  12/24/20 120/70   Repeat BMP Maintain med doses F/up in 37month

## 2021-11-24 NOTE — Assessment & Plan Note (Signed)
-   Repeat BMP

## 2021-11-24 NOTE — Progress Notes (Signed)
Subjective:  Patient ID: Patty Gilmore, female    DOB: April 30, 1991  Age: 31 y.o. MRN: [id number]  CC: Establish Care (New Patient, concerns for blood pressure. Pt states she has been dealing with high blood pressure since 6th grade. Pt recently had a kidney infection around 09/06/2021, would like to make sure everything is okay because of having high blood pressure. )  HPI  Previous pcp with [facility].  Primary hypertension Onset of HTN in 6th grade Diet: low sodium diet. Execise: walking daily and resistant training. BP at goal with amlodipine and HCTZ nexplanon for contraception BP Readings from Last 3 Encounters:  11/24/21 130/90  09/07/21 138/76  12/24/20 120/70   Repeat BMP Maintain med doses F/up in 79month  CKD (chronic kidney disease) stage 2, GFR 60-89 ml/min Repeat BMP  Acanthosis nigricans Hyperpigmentation on neck, hair growth of chin and neck Advised about importance of heart healthy diet and regular exercise to promote weight loss. We discussed switching HCTZ to spironolactone. She declined Repeat hgbA1c  GAD (generalized anxiety disorder) Onset of anxiety after birth of daughter in 2017. Depression during pregnancy (absence of child's father and high risk pregnancy), has support from family and friend then and now No previous medication or psycho therapy in past. Marijuana use: 1/2blunt every 3days since 2018.To help manage anxiety. No ETOH use. No tobacco use  Declined use of medication and referral for psychotherapy at this time  Depression screen Patty Gilmore 2/9 11/24/2021 12/16/2020 10/14/2020  Decreased Interest 0 0 0  Down, Depressed, Hopeless 0 0 0  PHQ - 2 Score 0 0 0  Altered sleeping 0 - -  Tired, decreased energy 0 - -  Change in appetite 0 - -  Feeling bad or failure about yourself  0 - -  Trouble concentrating 0 - -  Moving slowly or fidgety/restless 0 - -  Suicidal thoughts 0 - -  PHQ-9 Score 0 - -  Difficult doing work/chores Not difficult at  all - -    GAD 7 : Generalized Anxiety Score 11/24/2021 03/19/2018 06/14/2016 04/06/2016  Nervous, Anxious, on Edge 0 1 0 0  Control/stop worrying 1 1 1 1   Worry too much - different things 1 1 1 1   Trouble relaxing 0 1 0 1  Restless 0 0 0 0  Easily annoyed or irritable 0 0 0 0  Afraid - awful might happen 0 0 0 0  Total GAD 7 Score 2 4 2 3   Anxiety Difficulty Somewhat difficult - - -   Reviewed past Medical, Social and Family history today. Social History   Socioeconomic History   Marital status: Single    Spouse name: Not on file   Number of children: 1   Years of education: Not on file   Highest education level: Not on file  Occupational History    Employer: [employer]    Comment: [employer]  Tobacco Use   Smoking status: Never   Smokeless tobacco: Never  Vaping Use   Vaping Use: Never used  Substance and Sexual Activity   Alcohol use: No    Comment: Rarely (less than once per month)   Drug use: Yes    Frequency: 3.0 times per week    Types: Marijuana    Comment: 1/2blunt   Sexual activity: Yes    Birth control/protection: Implant, Condom  Other Topics Concern   Not on file  Social History Narrative   Not on file   Social Determinants of Health  Financial Resource Strain: Not on file  Food Insecurity: Not on file  Transportation Needs: Not on file  Physical Activity: Not on file  Stress: Not on file  Social Connections: Not on file  Intimate Partner Violence: Not on file    Outpatient Medications Prior to Visit  Medication Sig Dispense Refill   amLODipine (NORVASC) 5 MG tablet Take 1 tablet (5 mg total) by mouth daily. 90 tablet 3   hydrochlorothiazide (HYDRODIURIL) 25 MG tablet Take 1 tablet (25 mg total) by mouth daily. 90 tablet 3   acetaminophen (TYLENOL) 500 MG tablet Take 500 mg by mouth every 6 (six) hours as needed for moderate pain.     cephALEXin (KEFLEX) 500 MG capsule Take 1 capsule (500 mg total) by mouth 4 (four) times daily. 28 capsule 0    HYDROcodone-acetaminophen (NORCO/VICODIN) 5-325 MG tablet Take 1 tablet by mouth every 6 (six) hours as needed for severe pain. 5 tablet 0   Facility-Administered Medications Prior to Visit  Medication Dose Route Frequency Provider Last Rate Last Admin   etonogestrel (NEXPLANON) implant 68 mg  68 mg Subdermal Once Anyanwu, Ugonna A, MD        ROS See HPI  Objective:  BP 130/90 (BP Location: Left Arm, Patient Position: Sitting, Cuff Size: Normal)    Pulse 85    Temp (!) 97.5 F (36.4 C) (Temporal)    Resp 18    Wt 215 lb (97.5 kg)    SpO2 99%    Breastfeeding No    BMI 38.09 kg/m   Physical Exam Constitutional:      Appearance: She is obese.  Cardiovascular:     Rate and Rhythm: Normal rate and regular rhythm.     Pulses: Normal pulses.     Heart sounds: Normal heart sounds.  Pulmonary:     Effort: Pulmonary effort is normal.     Breath sounds: Normal breath sounds.  Musculoskeletal:     Right lower leg: No edema.     Left lower leg: No edema.  Neurological:     Mental Status: She is alert and oriented to person, place, and time.  Psychiatric:        Mood and Affect: Mood normal.        Behavior: Behavior normal.        Thought Content: Thought content normal.    Assessment & Plan:  This visit occurred during the SARS-CoV-2 public health emergency.  Safety protocols were in place, including screening questions prior to the visit, additional usage of staff PPE, and extensive cleaning of exam room while observing appropriate contact time as indicated for disinfecting solutions.   Patty Gilmore was seen today for establish care.  Diagnoses and all orders for this visit:  Primary hypertension -     Basic metabolic panel -     hydrochlorothiazide (HYDRODIURIL) 25 MG tablet; Take 1 tablet (25 mg total) by mouth daily. -     amLODipine (NORVASC) 5 MG tablet; Take 1 tablet (5 mg total) by mouth daily.  CKD (chronic kidney disease) stage 2, GFR 60-89 ml/min -     Urinalysis w  microscopic + reflex cultur  Acanthosis nigricans -     Hemoglobin A1c  GAD (generalized anxiety disorder)    Problem List Items Addressed This Visit       Cardiovascular and Mediastinum   Primary hypertension - Primary    Onset of HTN in 6th grade Diet: low sodium diet. Execise: walking daily and resistant training. BP  at goal with amlodipine and HCTZ nexplanon for contraception BP Readings from Last 3 Encounters:  11/24/21 130/90  09/07/21 138/76  12/24/20 120/70  Repeat BMP Maintain med doses F/up in 10month      Relevant Medications   hydrochlorothiazide (HYDRODIURIL) 25 MG tablet   amLODipine (NORVASC) 5 MG tablet   Other Relevant Orders   Basic metabolic panel     Musculoskeletal and Integument   Acanthosis nigricans    Hyperpigmentation on neck, hair growth of chin and neck Advised about importance of heart healthy diet and regular exercise to promote weight loss. We discussed switching HCTZ to spironolactone. She declined Repeat hgbA1c      Relevant Orders   Hemoglobin A1c     Genitourinary   CKD (chronic kidney disease) stage 2, GFR 60-89 ml/min    Repeat BMP      Relevant Orders   Urinalysis w microscopic + reflex cultur     Other   GAD (generalized anxiety disorder)    Onset of anxiety after birth of daughter in 2017. Depression during pregnancy (absence of child's father and high risk pregnancy), has support from family and friend then and now No previous medication or psycho therapy in past. Marijuana use: 1/2blunt every 3days since 2018.To help manage anxiety. No ETOH use. No tobacco use  Declined use of medication and referral for psychotherapy at this time       Follow-up: Return in about 6 months (around 05/24/2022) for CPE (fasting).  Wilfred Lacy, NP

## 2021-11-24 NOTE — Assessment & Plan Note (Signed)
Onset of anxiety after birth of daughter in 2017. Depression during pregnancy (absence of child's father and high risk pregnancy), has support from family and friend then and now No previous medication or psycho therapy in past. Marijuana use: 1/2blunt every 3days since 2018.To help manage anxiety. No ETOH use. No tobacco use  Declined use of medication and referral for psychotherapy at this time

## 2021-11-26 LAB — URINALYSIS W MICROSCOPIC + REFLEX CULTURE
Bacteria, UA: NONE SEEN /HPF
Bilirubin Urine: NEGATIVE
Glucose, UA: NEGATIVE
Hgb urine dipstick: NEGATIVE
Hyaline Cast: NONE SEEN /LPF
Ketones, ur: NEGATIVE
Nitrites, Initial: NEGATIVE
Protein, ur: NEGATIVE
RBC / HPF: NONE SEEN /HPF (ref 0–2)
Specific Gravity, Urine: 1.011 (ref 1.001–1.035)
Squamous Epithelial / HPF: NONE SEEN /HPF (ref <=5)
WBC, UA: NONE SEEN /HPF (ref 0–5)
pH: 5.5 (ref 5.0–8.0)

## 2021-11-26 LAB — URINE CULTURE
MICRO NUMBER:: 13050588
Result:: NO GROWTH
SPECIMEN QUALITY:: ADEQUATE

## 2021-11-26 LAB — CULTURE INDICATED

## 2021-12-21 ENCOUNTER — Ambulatory Visit: Payer: Self-pay | Admitting: Nurse Practitioner

## 2022-04-20 ENCOUNTER — Encounter: Payer: Self-pay | Admitting: Obstetrics and Gynecology

## 2022-04-20 ENCOUNTER — Ambulatory Visit (INDEPENDENT_AMBULATORY_CARE_PROVIDER_SITE_OTHER): Payer: BC Managed Care – PPO | Admitting: Obstetrics and Gynecology

## 2022-04-20 VITALS — BP 130/82 | HR 77 | Ht 63.0 in | Wt 223.0 lb

## 2022-04-20 DIAGNOSIS — Z3046 Encounter for surveillance of implantable subdermal contraceptive: Secondary | ICD-10-CM

## 2022-04-20 NOTE — Patient Instructions (Signed)
Nexplanon Instructions After Removal  Keep bandage clean and dry for 24 hours  May use ice/Tylenol/Ibuprofen for soreness or pain  If you develop fever, drainage or increased warmth from incision site-contact office immediately   

## 2022-04-20 NOTE — Progress Notes (Signed)
GYNECOLOGY  VISIT   HPI: 31 y.o.   Single Black or African American Not Hispanic or Latino  female   [phone] with Patient's last menstrual period was 03/02/2022.   here for Nexplanon removal. She is not wanting to be on any contraception after removing the implant, not currently sexually active.  She has had mood changes and weight gain since being on the Nexplanon.    GYNECOLOGIC HISTORY: Patient's last menstrual period was 03/02/2022. Contraception:Nexplanon  Menopausal hormone therapy: none         OB History     Gravida  1   Para  1   Term  0   Preterm  1   AB  0   Living  1      SAB  0   IAB  0   Ectopic  0   Multiple  0   Live Births  1              Patient Active Problem List   Diagnosis Date Noted   Family history of high blood pressure 11/24/2021   Primary hypertension 11/24/2021   CKD (chronic kidney disease) stage 2, GFR 60-89 ml/min 11/24/2021   GAD (generalized anxiety disorder) 11/24/2021   Acute pharyngitis 04/17/2018   Acanthosis nigricans 01/16/2012   LVH (left ventricular hypertrophy) 01/16/2012    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   states she had "thickened muscle around her heart"   Hypertension    was taking amlodipine stopped about 1 yr ago - no insurance - had been on meds since age 71   Pregnancy induced hypertension     Past Surgical History:  Procedure Laterality Date   CESAREAN SECTION N/A 05/04/2016   Procedure: CESAREAN SECTION;  Surgeon: Tilda Burrow, MD;  Location: [facility] SU[facility];  Service: Obstetrics;  Laterality: N/A;   LUMBAR PUNCTURE  07/20/2014   fluid build up around spinal column   NO PAST SURGERIES      Current Outpatient Medications  Medication Sig Dispense Refill   amLODipine (NORVASC) 5 MG tablet Take 1 tablet (5 mg total) by mouth daily. 90 tablet 3   hydrochlorothiazide (HYDRODIURIL) 25 MG tablet Take 1 tablet (25 mg total) by mouth daily. 90 tablet  3   Current Facility-Administered Medications  Medication Dose Route Frequency Provider Last Rate Last Admin   etonogestrel (NEXPLANON) implant 68 mg  68 mg Subdermal Once Anyanwu, Jethro Bastos, MD         ALLERGIES: Patient has no known allergies.  Family History  Problem Relation Age of Onset   Diabetes Maternal Grandmother    Diabetes Paternal Grandmother    Thyroid disease Mother     Social History   Socioeconomic History   Marital status: Single    Spouse name: Not on file   Number of children: 1   Years of education: Not on file   Highest education level: Not on file  Occupational History    Employer: [employer]    Comment: [employer]  Tobacco Use   Smoking status: Never   Smokeless tobacco: Never  Vaping Use   Vaping Use: Never used  Substance and Sexual Activity   Alcohol use: No    Comment: Rarely (less than once per month)   Drug use: Yes    Frequency: 3.0 times per week    Types: Marijuana    Comment: 1/2blunt   Sexual activity: Yes    Birth control/protection:  Implant, Condom  Other Topics Concern   Not on file  Social History Narrative   Not on file   Social Determinants of Health   Financial Resource Strain: Not on file  Food Insecurity: Not on file  Transportation Needs: Not on file  Physical Activity: Not on file  Stress: Not on file  Social Connections: Not on file  Intimate Partner Violence: Not on file    Review of Systems  All other systems reviewed and are negative.   PHYSICAL EXAMINATION:    BP 130/82   Pulse 77   Ht 5\' 3"  (1.6 m)   Wt 223 lb (101.2 kg)   LMP 03/02/2022   SpO2 98%   BMI 39.50 kg/m     General appearance: alert, cooperative and appears stated age  Risks of nexplanon removal were reviewed with the patient and a consent was signed.  The patient was placed in the supine position with her left arm bent at the elbow. The area was cleansed with Hibiclens and injected with 1% lidocaine. A #11 blade was used to  incise over the distal end of the nexplanon implant. The implant was grasped with a clamp and removed.   The patients arm was cleansed of Hibiclens and a steri strip was placed over the incision. A gauze was wrapped around her arm.  She tolerated the procedure well  Instructions for care were discussed.   1. Nexplanon removal Routine f/u

## 2022-07-12 ENCOUNTER — Other Ambulatory Visit: Payer: Self-pay | Admitting: Nurse Practitioner

## 2022-07-12 DIAGNOSIS — I10 Essential (primary) hypertension: Secondary | ICD-10-CM

## 2022-07-13 ENCOUNTER — Telehealth: Payer: Self-pay | Admitting: Nurse Practitioner

## 2022-07-13 DIAGNOSIS — I10 Essential (primary) hypertension: Secondary | ICD-10-CM

## 2022-07-13 MED ORDER — HYDROCHLOROTHIAZIDE 25 MG PO TABS: 25.0000 mg | ORAL_TABLET | Freq: Every day | ORAL | 0 refills | Status: DC

## 2022-07-13 NOTE — Telephone Encounter (Signed)
Caller Name: Raychel Dowler Call back phone #: [phone]   MEDICATION(S):  Hydrochlorothiazide  Has the patient contacted their pharmacy (YES/NO)? yes What did pharmacy advise? For PCP to send in new prescription  Preferred Pharmacy:  [facility] [place], [place] - [address] Phone:  [phone]  Fax:  [phone]    ~~~Please advise patient/caregiver to allow 2-3 business days to process RX refills.

## 2022-08-15 ENCOUNTER — Encounter: Payer: Self-pay | Admitting: Family Medicine

## 2022-08-15 ENCOUNTER — Ambulatory Visit: Payer: Commercial Managed Care - PPO | Admitting: Family Medicine

## 2022-08-15 VITALS — BP 116/72 | HR 113 | Temp 97.9°F | Ht 63.0 in | Wt 220.4 lb

## 2022-08-15 DIAGNOSIS — L0591 Pilonidal cyst without abscess: Secondary | ICD-10-CM | POA: Insufficient documentation

## 2022-08-15 MED ORDER — AMOXICILLIN-POT CLAVULANATE 875-125 MG PO TABS: 1.0000 | ORAL_TABLET | Freq: Two times a day (BID) | ORAL | 0 refills | Status: DC

## 2022-08-15 MED ORDER — TRAMADOL HCL 50 MG PO TABS: 50.0000 mg | ORAL_TABLET | Freq: Three times a day (TID) | ORAL | 0 refills | Status: AC | PRN

## 2022-08-15 NOTE — Progress Notes (Signed)
   Established Patient Office Visit  Subjective   Patient ID: Patty Gilmore, female    DOB: 05/30/1991  Age: 31 y.o. MRN: [id number]  Chief Complaint  Patient presents with   Cyst    Cyst on tailbone x 3 days burst today very painful with some draining.     HPI for evaluation and treatment of a painful swelling in her  tailbone area over the last 3 days.  It is ruptured and is spontaneously draining foul-smelling material.  She says that this is, before but has not been as large.    Review of Systems  Constitutional: Negative.   HENT: Negative.    Eyes:  Negative for blurred vision, discharge and redness.  Respiratory: Negative.    Cardiovascular: Negative.   Gastrointestinal:  Negative for abdominal pain.  Genitourinary: Negative.   Musculoskeletal: Negative.  Negative for myalgias.  Skin:  Negative for rash.  Neurological:  Negative for tingling, loss of consciousness and weakness.  Endo/Heme/Allergies:  Negative for polydipsia.      Objective:     BP 116/72 (BP Location: Left Arm, Patient Position: Sitting, Cuff Size: Large)   Pulse (!) 113   Temp 97.9 F (36.6 C) (Temporal)   Ht 5\' 3"  (1.6 m)   Wt 220 lb 6.4 oz (100 kg)   SpO2 100%   BMI 39.04 kg/m    Physical Exam Constitutional:      General: She is not in acute distress.    Appearance: Normal appearance. She is not ill-appearing, toxic-appearing or diaphoretic.  HENT:     Head: Normocephalic and atraumatic.     Right Ear: External ear normal.     Left Ear: External ear normal.  Eyes:     General: No scleral icterus.       Right eye: No discharge.        Left eye: No discharge.     Extraocular Movements: Extraocular movements intact.     Conjunctiva/sclera: Conjunctivae normal.  Pulmonary:     Effort: Pulmonary effort is normal. No respiratory distress.  Skin:    General: Skin is warm and dry.       Neurological:     Mental Status: She is alert and oriented to person, place, and time.   Psychiatric:        Mood and Affect: Mood normal.        Behavior: Behavior normal.      No results found for any visits on 08/15/22.    The ASCVD Risk score (Arnett DK, et al., 2019) failed to calculate for the following reasons:   The 2019 ASCVD risk score is only valid for ages 25 to 34    Assessment & Plan:   Problem List Items Addressed This Visit       Musculoskeletal and Integument   Infected pilonidal cyst - Primary   Relevant Medications   amoxicillin-clavulanate (AUGMENTIN) 875-125 MG tablet   traMADol (ULTRAM) 50 MG tablet    Return Has follow-up scheduled with 77 on the 21st..  Continue with sits baths 3 times daily to encourage drainage.  Complete course of Augmentin.  Could consider surgical referral if recurrent.  Through the 19th.  Information about pilonidal cyst was given.   20, MD

## 2022-08-18 ENCOUNTER — Telehealth: Payer: Self-pay

## 2022-08-18 DIAGNOSIS — I10 Essential (primary) hypertension: Secondary | ICD-10-CM

## 2022-08-18 MED ORDER — AMLODIPINE BESYLATE 5 MG PO TABS: 5.0000 mg | ORAL_TABLET | Freq: Every day | ORAL | 1 refills | Status: DC

## 2022-08-18 NOTE — Telephone Encounter (Signed)
Pt needs refill for  amLODipine (NORVASC) 5 MG tablet  She is out of this medication. Pharmacy is [facility] on [address] and [address]

## 2022-08-22 ENCOUNTER — Encounter: Payer: Self-pay | Admitting: Nurse Practitioner

## 2022-08-22 ENCOUNTER — Other Ambulatory Visit (HOSPITAL_COMMUNITY)
Admission: RE | Admit: 2022-08-22 | Discharge: 2022-08-22 | Disposition: A | Discharge status: Home / Self Care | Payer: Commercial Managed Care - PPO | Source: Ambulatory Visit | Attending: Nurse Practitioner | Admitting: Nurse Practitioner

## 2022-08-22 ENCOUNTER — Ambulatory Visit (INDEPENDENT_AMBULATORY_CARE_PROVIDER_SITE_OTHER): Payer: Commercial Managed Care - PPO | Admitting: Nurse Practitioner

## 2022-08-22 VITALS — BP 124/80 | HR 41 | Temp 98.0°F | Ht 63.0 in | Wt 217.8 lb

## 2022-08-22 DIAGNOSIS — D500 Iron deficiency anemia secondary to blood loss (chronic): Secondary | ICD-10-CM

## 2022-08-22 DIAGNOSIS — I10 Essential (primary) hypertension: Secondary | ICD-10-CM

## 2022-08-22 DIAGNOSIS — R7303 Prediabetes: Secondary | ICD-10-CM

## 2022-08-22 DIAGNOSIS — Z13220 Encounter for screening for lipoid disorders: Secondary | ICD-10-CM

## 2022-08-22 DIAGNOSIS — A5901 Trichomonal vulvovaginitis: Secondary | ICD-10-CM

## 2022-08-22 DIAGNOSIS — Z113 Encounter for screening for infections with a predominantly sexual mode of transmission: Secondary | ICD-10-CM | POA: Diagnosis not present

## 2022-08-22 DIAGNOSIS — Z0001 Encounter for general adult medical examination with abnormal findings: Secondary | ICD-10-CM | POA: Diagnosis not present

## 2022-08-22 DIAGNOSIS — L83 Acanthosis nigricans: Secondary | ICD-10-CM | POA: Diagnosis not present

## 2022-08-22 DIAGNOSIS — Z136 Encounter for screening for cardiovascular disorders: Secondary | ICD-10-CM | POA: Diagnosis not present

## 2022-08-22 DIAGNOSIS — D649 Anemia, unspecified: Secondary | ICD-10-CM | POA: Diagnosis not present

## 2022-08-22 DIAGNOSIS — N1831 Chronic kidney disease, stage 3a: Secondary | ICD-10-CM

## 2022-08-22 DIAGNOSIS — Z23 Encounter for immunization: Secondary | ICD-10-CM

## 2022-08-22 LAB — COMPREHENSIVE METABOLIC PANEL
ALT: 38 U/L — ABNORMAL HIGH (ref 0–35)
AST: 32 U/L (ref 0–37)
Albumin: 4.4 g/dL (ref 3.5–5.2)
Alkaline Phosphatase: 72 U/L (ref 39–117)
BUN: 19 mg/dL (ref 6–23)
CO2: 27 mEq/L (ref 19–32)
Calcium: 9.8 mg/dL (ref 8.4–10.5)
Chloride: 100 mEq/L (ref 96–112)
Creatinine, Ser: 1.4 mg/dL — ABNORMAL HIGH (ref 0.40–1.20)
GFR: 50.56 mL/min — ABNORMAL LOW (ref >60.00)
Glucose, Bld: 82 mg/dL (ref 70–99)
Potassium: 4.2 mEq/L (ref 3.5–5.1)
Sodium: 134 mEq/L — ABNORMAL LOW (ref 135–145)
Total Bilirubin: 0.3 mg/dL (ref 0.2–1.2)
Total Protein: 8.7 g/dL — ABNORMAL HIGH (ref 6.0–8.3)

## 2022-08-22 LAB — LIPID PANEL
Cholesterol: 144 mg/dL (ref 0–200)
HDL: 40.3 mg/dL (ref >39.00)
LDL Cholesterol: 90 mg/dL (ref 0–99)
NonHDL: 103.95
Total CHOL/HDL Ratio: 4
Triglycerides: 72 mg/dL (ref 0.0–149.0)
VLDL: 14.4 mg/dL (ref 0.0–40.0)

## 2022-08-22 LAB — CBC WITH DIFFERENTIAL/PLATELET
Basophils Absolute: 0 10*3/uL (ref 0.0–0.1)
Basophils Relative: 0.7 % (ref 0.0–3.0)
Eosinophils Absolute: 0.1 10*3/uL (ref 0.0–0.7)
Eosinophils Relative: 0.9 % (ref 0.0–5.0)
HCT: 34.6 % — ABNORMAL LOW (ref 36.0–46.0)
Hemoglobin: 11.8 g/dL — ABNORMAL LOW (ref 12.0–15.0)
Lymphocytes Relative: 38.9 % (ref 12.0–46.0)
Lymphs Abs: 2.6 10*3/uL (ref 0.7–4.0)
MCHC: 33.9 g/dL (ref 30.0–36.0)
MCV: 94 fl (ref 78.0–100.0)
Monocytes Absolute: 0.3 10*3/uL (ref 0.1–1.0)
Monocytes Relative: 4.9 % (ref 3.0–12.0)
Neutro Abs: 3.7 10*3/uL (ref 1.4–7.7)
Neutrophils Relative %: 54.6 % (ref 43.0–77.0)
Platelets: 539 10*3/uL — ABNORMAL HIGH (ref 150.0–400.0)
RBC: 3.68 Mil/uL — ABNORMAL LOW (ref 3.87–5.11)
RDW: 13.3 % (ref 11.5–15.5)
WBC: 6.8 10*3/uL (ref 4.0–10.5)

## 2022-08-22 LAB — HEMOGLOBIN A1C: Hgb A1c MFr Bld: 6.1 % (ref 4.6–6.5)

## 2022-08-22 MED ORDER — AMLODIPINE BESYLATE 5 MG PO TABS: 5.0000 mg | ORAL_TABLET | Freq: Every day | ORAL | 3 refills | Status: DC

## 2022-08-22 MED ORDER — HYDROCHLOROTHIAZIDE 25 MG PO TABS: 25.0000 mg | ORAL_TABLET | Freq: Every day | ORAL | 1 refills | Status: DC

## 2022-08-22 NOTE — Progress Notes (Addendum)
Complete physical exam  Patient: Patty Gilmore   DOB: 1991-05-21   31 y.o. Female  MRN: [id number] Visit Date: 09/01/2022  Subjective:    Chief Complaint  Patient presents with  . Annual Exam    CPE Pt fasting  Cyst f/u  HPV given today    Patty Gilmore is a 31 y.o. female who presents today for a complete physical exam. She reports consuming a low sodium diet.  Walking daily  She generally feels well. She reports sleeping well. She does not have additional problems to discuss today.  Vision:No Dental:No STD Screen:Yes  Most recent fall risk assessment:    08/22/2022    1:05 PM  Fall Risk   Falls in the past year? 0  Number falls in past yr: 0  Injury with Fall? 0     Most recent depression screenings:    08/22/2022    2:02 PM 08/15/2022   11:19 AM  PHQ 2/9 Scores  PHQ - 2 Score 0 0  PHQ- 9 Score 1     HPI  Primary hypertension Stop HCTZ due to decline in renal function and low sodium Increase amlodipine to 10mg  F/up in 79months  CKD (chronic kidney disease) stage 3, GFR 30-59 ml/min (HCC) Stop hctz Repeat labs in 88months  Normochromic normocytic anemia Normal iron panel Elevated platelet count at 539. Ref to hematology   Past Medical History:  Diagnosis Date  . 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 Hypertension    was taking amlodipine stopped about 1 yr ago - no insurance - had been on meds since age 76  . Pregnancy induced hypertension    Past Surgical History:  Procedure Laterality Date  . CESAREAN SECTION N/A 05/04/2016   Procedure: CESAREAN SECTION;  Surgeon: 07/04/2016, MD;  Location: [facility]IRTHING SUIT[facility];  Service: Obstetrics;  Laterality: N/A;  . LUMBAR PUNCTURE  07/20/2014   fluid build up around spinal column  . NO PAST SURGERIES     Social History   Socioeconomic History  . Marital status: Single    Spouse name: Not on file  . Number of children: 1  . Years of  education: Not on file  . Highest education level: Not on file  Occupational History    Employer: [employer]    Comment: [employer]  Tobacco Use  . Smoking status: Never  . Smokeless tobacco: Never  Vaping Use  . Vaping Use: Never used  Substance and Sexual Activity  . Alcohol use: No    Comment: Rarely (less than once per month)  . Drug use: Not Currently    Types: Marijuana  . Sexual activity: Not Currently  Other Topics Concern  . Not on file  Social History Narrative  . Not on file   Social Determinants of Health   Financial Resource Strain: Not on file  Food Insecurity: Not on file  Transportation Needs: Not on file  Physical Activity: Not on file  Stress: Not on file  Social Connections: Not on file  Intimate Partner Violence: Not on file   Family Status  Relation Name Status  . MGM  Alive  . PGM  Deceased  . Mother  Alive       grave's disease  . Father  Alive       healthy  . Sister  Alive       healthy  . Brother  Alive  healthy  . MGF  Alive  . PGF  Alive  . Brother  Alive   Family History  Problem Relation Age of Onset  . Diabetes Maternal Grandmother   . Diabetes Paternal Grandmother   . Thyroid disease Mother    No Known Allergies  Patient Care Team: Nahjae Hoeg, Bonna Gainsharlotte Lum, NP as PCP - General (Internal Medicine)   Medications: Outpatient Medications Prior to Visit  Medication Sig  . amoxicillin-clavulanate (AUGMENTIN) 875-125 MG tablet Take 1 tablet by mouth 2 (two) times daily.  . [DISCONTINUED] amLODipine (NORVASC) 5 MG tablet Take 1 tablet (5 mg total) by mouth daily.  . [DISCONTINUED] hydrochlorothiazide (HYDRODIURIL) 25 MG tablet Take 1 tablet (25 mg total) by mouth daily.  . [DISCONTINUED] etonogestrel (NEXPLANON) implant 68 mg    No facility-administered medications prior to visit.    Review of Systems  Constitutional:  Negative for fever.  HENT:  Negative for congestion and sore throat.   Eyes:        Negative for  visual changes  Respiratory:  Negative for cough and shortness of breath.   Cardiovascular:  Negative for chest pain, palpitations and leg swelling.  Gastrointestinal:  Negative for blood in stool, constipation and diarrhea.  Genitourinary:  Negative for dysuria, frequency and urgency.  Musculoskeletal:  Negative for myalgias.  Skin:  Negative for rash.  Neurological:  Negative for dizziness and headaches.  Hematological:  Does not bruise/bleed easily.  Psychiatric/Behavioral:  Negative for suicidal ideas. The patient is not nervous/anxious.       Objective:  BP 124/80 (BP Location: Right Arm, Patient Position: Sitting, Cuff Size: Normal)   Pulse (!) 41   Temp 98 F (36.7 C) (Temporal)   Ht 5\' 3"  (1.6 m)   Wt 217 lb 12.8 oz (98.8 kg)   LMP 08/08/2022 (Exact Date)   SpO2 99%   BMI 38.58 kg/m       Physical Exam Constitutional:      General: She is not in acute distress.    Appearance: She is obese.  HENT:     Right Ear: Tympanic membrane, ear canal and external ear normal.     Left Ear: Tympanic membrane, ear canal and external ear normal.     Nose: Nose normal.  Eyes:     General: No scleral icterus.    Extraocular Movements: Extraocular movements intact.     Conjunctiva/sclera: Conjunctivae normal.     Pupils: Pupils are equal, round, and reactive to light.  Cardiovascular:     Rate and Rhythm: Normal rate and regular rhythm.     Pulses: Normal pulses.     Heart sounds: Normal heart sounds.  Pulmonary:     Effort: Pulmonary effort is normal. No respiratory distress.     Breath sounds: Normal breath sounds.  Abdominal:     General: Bowel sounds are normal. There is no distension.     Palpations: Abdomen is soft.  Musculoskeletal:        General: Normal range of motion.     Cervical back: Normal range of motion and neck supple.     Right lower leg: No edema.     Left lower leg: No edema.  Lymphadenopathy:     Cervical: No cervical adenopathy.  Skin:     General: Skin is warm and dry.     Comments: Resolved abscess  Neurological:     Mental Status: She is alert and oriented to person, place, and time.  Psychiatric:  Mood and Affect: Mood normal.        Behavior: Behavior normal.        Thought Content: Thought content normal.    Results for orders placed or performed in visit on 08/22/22  Hemoglobin A1c  Result Value Ref Range   Hgb A1c MFr Bld 6.1 4.6 - 6.5 %  Comprehensive metabolic panel  Result Value Ref Range   Sodium 134 (L) 135 - 145 mEq/L   Potassium 4.2 3.5 - 5.1 mEq/L   Chloride 100 96 - 112 mEq/L   CO2 27 19 - 32 mEq/L   Glucose, Bld 82 70 - 99 mg/dL   BUN 19 6 - 23 mg/dL   Creatinine, Ser 8.27 (H) 0.40 - 1.20 mg/dL   Total Bilirubin 0.3 0.2 - 1.2 mg/dL   Alkaline Phosphatase 72 39 - 117 U/L   AST 32 0 - 37 U/L   ALT 38 (H) 0 - 35 U/L   Total Protein 8.7 (H) 6.0 - 8.3 g/dL   Albumin 4.4 3.5 - 5.2 g/dL   GFR 07.86 (L) >75.44 mL/min   Calcium 9.8 8.4 - 10.5 mg/dL  CBC with Differential/Platelet  Result Value Ref Range   WBC 6.8 4.0 - 10.5 K/uL   RBC 3.68 (L) 3.87 - 5.11 Mil/uL   Hemoglobin 11.8 (L) 12.0 - 15.0 g/dL   HCT 92.0 (L) 10.0 - 71.2 %   MCV 94.0 78.0 - 100.0 fl   MCHC 33.9 30.0 - 36.0 g/dL   RDW 19.7 58.8 - 32.5 %   Platelets 539.0 Repeated and verified X2. (H) 150.0 - 400.0 K/uL   Neutrophils Relative % 54.6 43.0 - 77.0 %   Lymphocytes Relative 38.9 12.0 - 46.0 %   Monocytes Relative 4.9 3.0 - 12.0 %   Eosinophils Relative 0.9 0.0 - 5.0 %   Basophils Relative 0.7 0.0 - 3.0 %   Neutro Abs 3.7 1.4 - 7.7 K/uL   Lymphs Abs 2.6 0.7 - 4.0 K/uL   Monocytes Absolute 0.3 0.1 - 1.0 K/uL   Eosinophils Absolute 0.1 0.0 - 0.7 K/uL   Basophils Absolute 0.0 0.0 - 0.1 K/uL  Lipid panel  Result Value Ref Range   Cholesterol 144 0 - 200 mg/dL   Triglycerides 49.8 0.0 - 149.0 mg/dL   HDL 26.41 >58.30 mg/dL   VLDL 94.0 0.0 - 76.8 mg/dL   LDL Cholesterol 90 0 - 99 mg/dL   Total CHOL/HDL Ratio 4    NonHDL  103.95   Iron, TIBC and Ferritin Panel  Result Value Ref Range   Iron 71 40 - 190 mcg/dL   TIBC 088 110 - 315 mcg/dL (calc)   %SAT 21 16 - 45 % (calc)   Ferritin 83 16 - 154 ng/mL  HIV Antibody (routine testing w rflx)  Result Value Ref Range   HIV 1&2 Ab, 4th Generation NON-REACTIVE NON-REACTIVE  RPR  Result Value Ref Range   RPR Ser Ql NON-REACTIVE NON-REACTIVE  Urine cytology ancillary only  Result Value Ref Range   Neisseria Gonorrhea Negative    Chlamydia Negative    Trichomonas Positive (A)    Comment Normal Reference Range Trichomonas - Negative    Comment Normal Reference Ranger Chlamydia - Negative    Comment      Normal Reference Range Neisseria Gonorrhea - Negative      Assessment & Plan:    Routine Health Maintenance and Physical Exam  Immunization History  Administered Date(s) Administered  . HPV 9-valent 08/22/2022  .  HPV Quadrivalent 01/19/2012  . Influenza,inj,Quad PF,6+ Mos 01/13/2016, 06/14/2016, 11/08/2018  . Tdap 01/26/2014, 04/24/2016    Health Maintenance  Topic Date Due  . HPV VACCINES (3 - 3-dose series) 12/21/2022  . INFLUENZA VACCINE  12/31/2022 (Originally 05/02/2022)  . COVID-19 Vaccine (1) 09/01/2023 (Originally 11/04/1991)  . PAP SMEAR-Modifier  10/15/2023  . DTaP/Tdap/Td (3 - Td or Tdap) 04/24/2026  . Hepatitis C Screening  Completed  . HIV Screening  Completed    Discussed health benefits of physical activity, and encouraged her to engage in regular exercise appropriate for her age and condition.  Problem List Items Addressed This Visit       Cardiovascular and Mediastinum   Primary hypertension    Stop HCTZ due to decline in renal function and low sodium Increase amlodipine to 10mg  F/up in 26months      Relevant Medications   amLODipine (NORVASC) 10 MG tablet     Musculoskeletal and Integument   Acanthosis nigricans   Relevant Orders   Hemoglobin A1c (Completed)     Genitourinary   CKD (chronic kidney disease) stage 3,  GFR 30-59 ml/min (HCC)    Stop hctz Repeat labs in 35months        Other   Normochromic normocytic anemia    Normal iron panel Elevated platelet count at 539. Ref to hematology      Relevant Orders   CBC with Differential/Platelet (Completed)   Iron, TIBC and Ferritin Panel (Completed)   Ambulatory referral to Hematology / Oncology   Prediabetes   Other Visit Diagnoses     Encounter for preventative adult health care exam with abnormal findings    -  Primary   Relevant Orders   Comprehensive metabolic panel (Completed)   Lipid panel (Completed)   Encounter for lipid screening for cardiovascular disease       Relevant Orders   Lipid panel (Completed)   Need for HPV vaccine       Relevant Orders   HPV 9-valent vaccine,Recombinat (Completed)   Screen for STD (sexually transmitted disease)       Relevant Orders   HIV Antibody (routine testing w rflx) (Completed)   RPR (Completed)   Urine cytology ancillary only (Completed)   Trichomonas vaginitis       Relevant Medications   metroNIDAZOLE (FLAGYL) 500 MG tablet   Other Relevant Orders   Urine cytology ancillary only     Normal lipid and iron panel Negative HIV, RPR Persistent anemia with elevated platelet count: entered referral to hematology. hgbA1c at 6.1%: prediabetes. Do you want a referral to nytritionist? Stable renal function, but still stage 3 renal disease Stop HCTZ and increased amlodipine to 10mg  at bedtime Positive trichomonas: this is an STD. Send metronidazole. Inform your partner to get treatment. Schedule lab appt for repeat labs in 1week. F/up with me in 63months Return in about 6 months (around 02/20/2023) for HTN.     1month, NP

## 2022-08-22 NOTE — Patient Instructions (Signed)
Go to lab for blood draw and urine collection Schedule nurse visit for 3rd HPV vaccine in 73month  Preventive Care [age] Old, Female Preventive care refers to lifestyle choices and visits with your health care provider that can promote health and wellness. Preventive care visits are also called wellness exams. What can I expect for my preventive care visit? Counseling During your preventive care visit, your health care provider may ask about your: Medical history, including: Past medical problems. Family medical history. Pregnancy history. Current health, including: Menstrual cycle. Method of birth control. Emotional well-being. Home life and relationship well-being. Sexual activity and sexual health. Lifestyle, including: Alcohol, nicotine or tobacco, and drug use. Access to firearms. Diet, exercise, and sleep habits. Work and work [employer] Sunscreen use. Safety issues such as seatbelt and bike helmet use. Physical exam Your health care provider may check your: Height and weight. These may be used to calculate your BMI (body mass index). BMI is a measurement that tells if you are at a healthy weight. Waist circumference. This measures the distance around your waistline. This measurement also tells if you are at a healthy weight and may help predict your risk of certain diseases, such as type 2 diabetes and high blood pressure. Heart rate and blood pressure. Body temperature. Skin for abnormal spots. What immunizations do I need?  Vaccines are usually given at various ages, according to a schedule. Your health care provider will recommend vaccines for you based on your age, medical history, and lifestyle or other factors, such as travel or where you work. What tests do I need? Screening Your health care provider may recommend screening tests for certain conditions. This may include: Pelvic exam and Pap test. Lipid and cholesterol levels. Diabetes screening. This is  done by checking your blood sugar (glucose) after you have not eaten for a while (fasting). Hepatitis B test. Hepatitis C test. HIV (human immunodeficiency virus) test. STI (sexually transmitted infection) testing, if you are at risk. BRCA-related cancer screening. This may be done if you have a family history of breast, ovarian, tubal, or peritoneal cancers. Talk with your health care provider about your test results, treatment options, and if necessary, the need for more tests. Follow these instructions at home: Eating and drinking  Eat a healthy diet that includes fresh fruits and vegetables, whole grains, lean protein, and low-fat dairy products. Take vitamin and mineral supplements as recommended by your health care provider. Do not drink alcohol if: Your health care provider tells you not to drink. You are pregnant, may be pregnant, or are planning to become pregnant. If you drink alcohol: Limit how much you have to 0-1 drink a day. Know how much alcohol is in your drink. In the [place] one drink equals one 12 oz bottle of beer (355 mL), one 5 oz glass of wine (148 mL), or one 1 oz glass of hard liquor (44 mL). Lifestyle Brush your teeth every morning and night with fluoride toothpaste. Floss one time each day. Exercise for at least 30 minutes 5 or more days each week. Do not use any products that contain nicotine or tobacco. These products include cigarettes, chewing tobacco, and vaping devices, such as e-cigarettes. If you need help quitting, ask your health care provider. Do not use drugs. If you are sexually active, practice safe sex. Use a condom or other form of protection to prevent STIs. If you do not wish to become pregnant, use a form of birth control. If you plan to become  pregnant, see your health care provider for a prepregnancy visit. Find healthy ways to manage stress, such as: Meditation, yoga, or listening to music. Journaling. Talking to a trusted person. Spending  time with friends and family. Minimize exposure to UV radiation to reduce your risk of skin cancer. Safety Always wear your seat belt while driving or riding in a vehicle. Do not drive: If you have been drinking alcohol. Do not ride with someone who has been drinking. If you have been using any mind-altering substances or drugs. While texting. When you are tired or distracted. Wear a helmet and other protective equipment during sports activities. If you have firearms in your house, make sure you follow all gun safety procedures. Seek help if you have been physically or sexually abused. What's next? Go to your health care provider once a year for an annual wellness visit. Ask your health care provider how often you should have your eyes and teeth checked. Stay up to date on all vaccines. This information is not intended to replace advice given to you by your health care provider. Make sure you discuss any questions you have with your health care provider. Document Revised: 03/16/2021 Document Reviewed: 03/16/2021 Elsevier Patient Education  [place].

## 2022-08-23 ENCOUNTER — Encounter: Payer: Self-pay | Admitting: Nurse Practitioner

## 2022-08-23 LAB — URINE CYTOLOGY ANCILLARY ONLY
Chlamydia: NEGATIVE
Comment: NEGATIVE
Comment: NEGATIVE
Comment: NORMAL
Neisseria Gonorrhea: NEGATIVE
Trichomonas: POSITIVE — AB

## 2022-08-23 LAB — RPR: RPR Ser Ql: NONREACTIVE

## 2022-08-23 LAB — IRON,TIBC AND FERRITIN PANEL
%SAT: 21 % (calc) (ref 16–45)
Ferritin: 83 ng/mL (ref 16–154)
Iron: 71 ug/dL (ref 40–190)
TIBC: 342 mcg/dL (calc) (ref 250–450)

## 2022-08-23 LAB — HIV ANTIBODY (ROUTINE TESTING W REFLEX): HIV 1&2 Ab, 4th Generation: NONREACTIVE

## 2022-08-28 ENCOUNTER — Telehealth: Payer: Self-pay | Admitting: Nurse Practitioner

## 2022-08-28 NOTE — Telephone Encounter (Signed)
Caller Name: Danise Dehne Call back phone #: [phone]  Reason for Call: Pt received labs back during holiday and saw the abnormal findings. She is hoping to have a prescription called in for her. Please also call pt back

## 2022-08-28 NOTE — Telephone Encounter (Signed)
Labs have not been released. Once resulted, Claris Gower will send RX to pharmacy

## 2022-09-01 ENCOUNTER — Encounter: Payer: Self-pay | Admitting: Nurse Practitioner

## 2022-09-01 DIAGNOSIS — R7303 Prediabetes: Secondary | ICD-10-CM | POA: Insufficient documentation

## 2022-09-01 DIAGNOSIS — D649 Anemia, unspecified: Secondary | ICD-10-CM | POA: Insufficient documentation

## 2022-09-01 MED ORDER — METRONIDAZOLE 500 MG PO TABS: 2000.0000 mg | ORAL_TABLET | Freq: Once | ORAL | 0 refills | Status: AC

## 2022-09-01 MED ORDER — AMLODIPINE BESYLATE 10 MG PO TABS: 10.0000 mg | ORAL_TABLET | Freq: Every day | ORAL | 3 refills | Status: DC

## 2022-09-01 NOTE — Addendum Note (Signed)
Addended by: Alysia Penna L on: 09/01/2022 11:48 AM   Modules accepted: Orders

## 2022-09-01 NOTE — Assessment & Plan Note (Signed)
Normal iron panel Elevated platelet count at 539. Ref to hematology

## 2022-09-01 NOTE — Assessment & Plan Note (Signed)
Stop hctz Repeat labs in 22months

## 2022-09-01 NOTE — Assessment & Plan Note (Signed)
Stop HCTZ due to decline in renal function and low sodium Increase amlodipine to 10mg  F/up in 12months

## 2022-09-01 NOTE — Telephone Encounter (Signed)
Please call pt back to speak about abnormal findings

## 2022-09-06 NOTE — Telephone Encounter (Signed)
Caller Name: Ebonique Hallstrom Call back phone #: [phone]  Reason for Call: There are no openings until January 4th now. Is there any way to get this pt seen before then? There is a same day opening on Monday or Friday of next week that does not interfere with session limits

## 2022-09-07 ENCOUNTER — Ambulatory Visit: Payer: Commercial Managed Care - PPO | Admitting: Nurse Practitioner

## 2022-09-07 ENCOUNTER — Other Ambulatory Visit (HOSPITAL_COMMUNITY)
Admission: RE | Admit: 2022-09-07 | Discharge: 2022-09-07 | Disposition: A | Discharge status: Home / Self Care | Payer: Commercial Managed Care - PPO | Source: Ambulatory Visit | Attending: Nurse Practitioner | Admitting: Nurse Practitioner

## 2022-09-07 ENCOUNTER — Encounter: Payer: Self-pay | Admitting: Nurse Practitioner

## 2022-09-07 VITALS — BP 116/76 | HR 78 | Temp 97.6°F | Ht 63.0 in | Wt 220.8 lb

## 2022-09-07 DIAGNOSIS — A5901 Trichomonal vulvovaginitis: Secondary | ICD-10-CM

## 2022-09-07 DIAGNOSIS — B3731 Acute candidiasis of vulva and vagina: Secondary | ICD-10-CM | POA: Diagnosis not present

## 2022-09-07 DIAGNOSIS — N898 Other specified noninflammatory disorders of vagina: Secondary | ICD-10-CM | POA: Diagnosis present

## 2022-09-07 DIAGNOSIS — R7303 Prediabetes: Secondary | ICD-10-CM | POA: Diagnosis not present

## 2022-09-07 DIAGNOSIS — I10 Essential (primary) hypertension: Secondary | ICD-10-CM

## 2022-09-07 NOTE — Patient Instructions (Addendum)
Reschedule may appt to March. You will be contacted to schedule appt with nutritionist. Maintain appt with hematology

## 2022-09-07 NOTE — Progress Notes (Signed)
Established Patient Visit  Patient: Patty Gilmore   DOB: Oct 11, 1990   31 y.o. Female  MRN: [id number] Visit Date: 09/07/2022  Subjective:    Chief Complaint  Patient presents with   Office Visit    Discuss lab findings  No other concerns     HPI Patty Gilmore presents with concern about recent lab results. She does not understand need for hematology referral and her diagnosis of prediabetes. She also reports persistent vaginal itching despite completion of metronidazole. No vaginal rash present, no dysuria, no flank pain. She does acknowledge Fhx of DM (father, aunts, PGF, uncles, and mother).  Reviewed medical, surgical, and social history today  Medications: Outpatient Medications Prior to Visit  Medication Sig   amLODipine (NORVASC) 10 MG tablet Take 1 tablet (10 mg total) by mouth daily.   amoxicillin-clavulanate (AUGMENTIN) 875-125 MG tablet Take 1 tablet by mouth 2 (two) times daily. (Patient not taking: Reported on 09/07/2022)   No facility-administered medications prior to visit.   Reviewed past medical and social history.   ROS per HPI above  Last CBC Lab Results  Component Value Date   WBC 6.8 08/22/2022   HGB 11.8 (L) 08/22/2022   HCT 34.6 (L) 08/22/2022   MCV 94.0 08/22/2022   MCH 32.4 09/06/2021   RDW 13.3 08/22/2022   PLT 539.0 Repeated and verified X2. (H) 08/22/2022   Last metabolic panel Lab Results  Component Value Date   GLUCOSE 82 08/22/2022   NA 134 (L) 08/22/2022   K 4.2 08/22/2022   CL 100 08/22/2022   CO2 27 08/22/2022   BUN 19 08/22/2022   CREATININE 1.40 (H) 08/22/2022   GFRNONAA 50 (L) 09/06/2021   CALCIUM 9.8 08/22/2022   PROT 8.7 (H) 08/22/2022   ALBUMIN 4.4 08/22/2022   LABGLOB 2.8 09/13/2020   AGRATIO 1.5 09/13/2020   BILITOT 0.3 08/22/2022   ALKPHOS 72 08/22/2022   AST 32 08/22/2022   ALT 38 (H) 08/22/2022   ANIONGAP 11 09/06/2021   Last hemoglobin A1c Lab Results  Component Value Date   HGBA1C 6.1  08/22/2022      Objective:  BP 116/76 (BP Location: Right Arm, Patient Position: Sitting, Cuff Size: Normal)   Pulse 78   Temp 97.6 F (36.4 C) (Temporal)   Ht 5\' 3"  (1.6 m)   Wt 220 lb 12.8 oz (100.2 kg)   LMP 08/08/2022 (Exact Date)   SpO2 98%   BMI 39.11 kg/m      Physical Exam Cardiovascular:     Rate and Rhythm: Normal rate.     Pulses: Normal pulses.  Pulmonary:     Effort: Pulmonary effort is normal.  Neurological:     Mental Status: She is alert and oriented to person, place, and time.  Psychiatric:        Mood and Affect: Mood normal.        Behavior: Behavior normal.        Thought Content: Thought content normal.    No results found for any visits on 09/07/22.    Assessment & Plan:    Problem List Items Addressed This Visit       Cardiovascular and Mediastinum   Primary hypertension   Relevant Orders   Referral to Nutrition and Diabetes Services     Other   Prediabetes - Primary   Relevant Orders   Referral to Nutrition and Diabetes Services   Other Visit  Diagnoses     Vaginal itching       Relevant Orders   Cervicovaginal ancillary only( [place])     I have spent with this patient regarding history taking, documentation, review of labs (hgbA1c. CBC. Iron panel, CMP), need for hematology referral, possible causes of anemia, her risk of developing diabetes and discussed treatment options. I answered her questions to her satisfaction. She agreed to referral to nutritionist to help her with necessary diet modifications.  Return in about 3 months (around 12/07/2022) for HTN, prediabetes and CKD.     Patty Penna, NP

## 2022-09-08 LAB — CERVICOVAGINAL ANCILLARY ONLY
Bacterial Vaginitis (gardnerella): NEGATIVE
Candida Glabrata: NEGATIVE
Candida Vaginitis: POSITIVE — AB
Chlamydia: NEGATIVE
Comment: NEGATIVE
Comment: NEGATIVE
Comment: NEGATIVE
Comment: NEGATIVE
Comment: NEGATIVE
Comment: NORMAL
Neisseria Gonorrhea: NEGATIVE
Trichomonas: POSITIVE — AB

## 2022-09-08 MED ORDER — FLUCONAZOLE 150 MG PO TABS: 150.0000 mg | ORAL_TABLET | Freq: Every day | ORAL | 0 refills | Status: DC

## 2022-09-08 MED ORDER — METRONIDAZOLE 500 MG PO TABS: 2000.0000 mg | ORAL_TABLET | Freq: Once | ORAL | 0 refills | Status: AC

## 2022-09-08 NOTE — Addendum Note (Signed)
Addended by: Michaela Corner on: 09/08/2022 03:37 PM   Modules accepted: Orders

## 2022-09-15 ENCOUNTER — Telehealth: Payer: Self-pay | Admitting: Hematology and Oncology

## 2022-09-15 NOTE — Telephone Encounter (Signed)
Patient called to r/s appointment. Patient r/s and notified 

## 2022-09-16 ENCOUNTER — Inpatient Hospital Stay: Payer: Commercial Managed Care - PPO | Admitting: Hematology and Oncology

## 2022-09-16 ENCOUNTER — Inpatient Hospital Stay: Payer: Commercial Managed Care - PPO

## 2022-10-09 ENCOUNTER — Other Ambulatory Visit: Payer: Self-pay

## 2022-10-09 ENCOUNTER — Inpatient Hospital Stay: Payer: Commercial Managed Care - PPO

## 2022-10-09 ENCOUNTER — Inpatient Hospital Stay: Payer: Commercial Managed Care - PPO | Attending: Hematology and Oncology | Admitting: Hematology and Oncology

## 2022-10-09 VITALS — BP 130/94 | HR 68 | Temp 97.9°F | Ht 63.0 in | Wt 224.2 lb

## 2022-10-09 DIAGNOSIS — I10 Essential (primary) hypertension: Secondary | ICD-10-CM | POA: Diagnosis not present

## 2022-10-09 DIAGNOSIS — E611 Iron deficiency: Secondary | ICD-10-CM | POA: Diagnosis not present

## 2022-10-09 DIAGNOSIS — D75839 Thrombocytosis, unspecified: Secondary | ICD-10-CM | POA: Insufficient documentation

## 2022-10-09 DIAGNOSIS — R7303 Prediabetes: Secondary | ICD-10-CM | POA: Insufficient documentation

## 2022-10-09 DIAGNOSIS — D500 Iron deficiency anemia secondary to blood loss (chronic): Secondary | ICD-10-CM

## 2022-10-09 DIAGNOSIS — D649 Anemia, unspecified: Secondary | ICD-10-CM | POA: Diagnosis not present

## 2022-10-09 LAB — CBC WITH DIFFERENTIAL (CANCER CENTER ONLY)
Abs Immature Granulocytes: 0.01 10*3/uL (ref 0.00–0.07)
Basophils Absolute: 0 10*3/uL (ref 0.0–0.1)
Basophils Relative: 0 %
Eosinophils Absolute: 0.1 10*3/uL (ref 0.0–0.5)
Eosinophils Relative: 2 %
HCT: 32 % — ABNORMAL LOW (ref 36.0–46.0)
Hemoglobin: 10.8 g/dL — ABNORMAL LOW (ref 12.0–15.0)
Immature Granulocytes: 0 %
Lymphocytes Relative: 30 %
Lymphs Abs: 1.7 10*3/uL (ref 0.7–4.0)
MCH: 32.1 pg (ref 26.0–34.0)
MCHC: 33.8 g/dL (ref 30.0–36.0)
MCV: 95.2 fL (ref 80.0–100.0)
Monocytes Absolute: 0.3 10*3/uL (ref 0.1–1.0)
Monocytes Relative: 5 %
Neutro Abs: 3.4 10*3/uL (ref 1.7–7.7)
Neutrophils Relative %: 63 %
Platelet Count: 306 10*3/uL (ref 150–400)
RBC: 3.36 MIL/uL — ABNORMAL LOW (ref 3.87–5.11)
RDW: 13.8 % (ref 11.5–15.5)
WBC Count: 5.5 10*3/uL (ref 4.0–10.5)
nRBC: 0 % (ref 0.0–0.2)

## 2022-10-09 LAB — CMP (CANCER CENTER ONLY)
ALT: 21 U/L (ref 0–44)
AST: 25 U/L (ref 15–41)
Albumin: 4.4 g/dL (ref 3.5–5.0)
Alkaline Phosphatase: 71 U/L (ref 38–126)
Anion gap: 8 (ref 5–15)
BUN: 10 mg/dL (ref 6–20)
CO2: 23 mmol/L (ref 22–32)
Calcium: 9.8 mg/dL (ref 8.9–10.3)
Chloride: 105 mmol/L (ref 98–111)
Creatinine: 1.28 mg/dL — ABNORMAL HIGH (ref 0.44–1.00)
GFR, Estimated: 58 mL/min — ABNORMAL LOW (ref >60)
Glucose, Bld: 82 mg/dL (ref 70–99)
Potassium: 4 mmol/L (ref 3.5–5.1)
Sodium: 136 mmol/L (ref 135–145)
Total Bilirubin: 0.4 mg/dL (ref 0.3–1.2)
Total Protein: 7.5 g/dL (ref 6.5–8.1)

## 2022-10-09 LAB — RETIC PANEL
Immature Retic Fract: 12.5 % (ref 2.3–15.9)
RBC.: 3.35 MIL/uL — ABNORMAL LOW (ref 3.87–5.11)
Retic Count, Absolute: 67.3 10*3/uL (ref 19.0–186.0)
Retic Ct Pct: 2 % (ref 0.4–3.1)
Reticulocyte Hemoglobin: 34 pg (ref >27.9)

## 2022-10-09 LAB — C-REACTIVE PROTEIN: CRP: <0.5 mg/dL (ref <1.0)

## 2022-10-09 LAB — IRON AND IRON BINDING CAPACITY (CC-WL,HP ONLY)
Iron: 62 ug/dL (ref 28–170)
Saturation Ratios: 17 % (ref 10.4–31.8)
TIBC: 372 ug/dL (ref 250–450)
UIBC: 310 ug/dL (ref 148–442)

## 2022-10-09 LAB — SEDIMENTATION RATE: Sed Rate: 24 mm/hr — ABNORMAL HIGH (ref 0–22)

## 2022-10-09 LAB — FERRITIN: Ferritin: 25 ng/mL (ref 11–307)

## 2022-10-09 NOTE — Progress Notes (Signed)
[facility] [phone]   [phone]  INITIAL CONSULT NOTE  [facility] Care Team: Nche, Bonna Gains, NP as PCP - General (Internal Medicine)  Hematological/Oncological History # Normocytic Anemia # Thrombocytosis 08/22/2022: WBC 6.8, Hgb 11.8, MCV 94, Plt 539 10/09/2022: establish care with Dr. Leonides Schanz   CHIEF COMPLAINTS/PURPOSE OF CONSULTATION:  "Anemia/Thrombocytosis "  HISTORY OF PRESENTING ILLNESS:  Patty Gilmore 32 y.o. female with medical history significant for hypertension and prediabetes who presents for evaluation of mild anemia and thrombocytosis.  On review of Patty previous records Patty Gilmore had labs collected on 08/22/2022 which showed white blood cell count 6.8, hemoglobin 11.8, MCV 94, and platelets of 539.  Iron studies showed iron sat 21% with a ferritin of 83.  Due to concern for these findings Patty Gilmore was referred to hematology for further evaluation and management.  On exam today Patty Gilmore reports that she was seeing her primary care provider Claris Gower and was told that she had low iron levels and anemia but her iron was not low enough to cause her degree of anemia.  Patty Gilmore reports that she has had issues with iron deficiency before in Patty past.  She has taken iron pills but they cause constipation and she has had to stop them.  She reports that her menstrual cycles now occur every 28 days after having stopped her Nexplanon.  She reports her cycles are not critically heavy and only last for about 3 days.  She reports that she changes her pads every 4 hours but they are not completely soaked and this is done for hygiene purposes.  She reports that she has no dietary restrictions though she will be seeing a nutritionist soon due to her pre-type 2 diabetes.  She notes that she does not eat a lot of red meat and has a stronger preference for chicken.  She has no history of inflammatory disease.  On further discussion she reports her  mother had Graves' disease and her father had type 2 diabetes.  She reports her maternal grandmother also had thyroid issues and her maternal great great grandmother had colon cancer menaced sized to Patty lung.  She reports that she is a never smoker never drinker and currently works as a Conservation officer, nature at [employer].  She notes that she is not having any trouble with lightheadedness, dizziness, or shortness of breath.  She denies any fatigue.  She notes that she does have some swelling in her lower extremities and sometimes on her face.  She associates this with her amlodipine medication.  She otherwise denies any fevers, chills, sweats, nausea, vomiting or diarrhea.  A full 10 point ROS was otherwise negative.  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   states she had "thickened muscle around her heart"   Hypertension    was taking amlodipine stopped about 1 yr ago - no insurance - had been on meds since age 78   Pregnancy induced hypertension     SURGICAL HISTORY: Past Surgical History:  Procedure Laterality Date   CESAREAN SECTION N/A 05/04/2016   Procedure: CESAREAN SECTION;  Surgeon: Tilda Burrow, MD;  Location: [facility];  Service: Obstetrics;  Laterality: N/A;   LUMBAR PUNCTURE  07/20/2014   fluid build up around spinal column   NO PAST SURGERIES      SOCIAL HISTORY: Social History   Socioeconomic History   Marital status: Single    Spouse name:  Not on file   Number of children: 1   Years of education: Not on file   Highest education level: Not on file  Occupational History    Employer: [employer]    Comment: [employer]  Tobacco Use   Smoking status: Never   Smokeless tobacco: Never  Vaping Use   Vaping Use: Never used  Substance and Sexual Activity   Alcohol use: No    Comment: Rarely (less than once per month)   Drug use: Not Currently    Types: Marijuana   Sexual activity: Not Currently  Other Topics Concern    Not on file  Social History Narrative   Not on file   Social Determinants of Health   Financial Resource Strain: Not on file  Food Insecurity: Not on file  Transportation Needs: Not on file  Physical Activity: Not on file  Stress: Not on file  Social Connections: Not on file  Intimate Partner Violence: Not on file    FAMILY HISTORY: Family History  Problem Relation Age of Onset   Diabetes Maternal Grandmother    Diabetes Paternal Grandmother    Thyroid disease Mother     ALLERGIES:  has No Known Allergies.  MEDICATIONS:  Current Outpatient Medications  Medication Sig Dispense Refill   amLODipine (NORVASC) 10 MG tablet Take 1 tablet (10 mg total) by mouth daily. 90 tablet 3   No current facility-administered medications for this visit.    REVIEW OF SYSTEMS:   Constitutional: ( - ) fevers, ( - )  chills , ( - ) night sweats Eyes: ( - ) blurriness of vision, ( - ) double vision, ( - ) watery eyes Ears, nose, mouth, throat, and face: ( - ) mucositis, ( - ) sore throat Respiratory: ( - ) cough, ( - ) dyspnea, ( - ) wheezes Cardiovascular: ( - ) palpitation, ( - ) chest discomfort, ( - ) lower extremity swelling Gastrointestinal:  ( - ) nausea, ( - ) heartburn, ( - ) change in bowel habits Skin: ( - ) abnormal skin rashes Lymphatics: ( - ) new lymphadenopathy, ( - ) easy bruising Neurological: ( - ) numbness, ( - ) tingling, ( - ) new weaknesses Behavioral/Psych: ( - ) mood change, ( - ) new changes  All other systems were reviewed with Patty Gilmore and are negative.  PHYSICAL EXAMINATION:  Vitals:   10/09/22 0915  BP: (!) 130/94  Pulse: 68  Temp: 97.9 F (36.6 C)   Filed Weights   10/09/22 0915  Weight: 224 lb 3.2 oz (101.7 kg)    GENERAL: well appearing young African-American female in NAD  SKIN: skin color, texture, turgor are normal, no rashes or significant lesions EYES: conjunctiva are pink and non-injected, sclera clear LUNGS: clear to auscultation  and percussion with normal breathing effort HEART: regular rate & rhythm and no murmurs and no lower extremity edema Musculoskeletal: no cyanosis of digits and no clubbing  PSYCH: alert & oriented x 3, fluent speech NEURO: no focal motor/sensory deficits  LABORATORY DATA:  I have reviewed Patty data as listed    Latest Ref Rng & Units 08/22/2022    1:34 PM 09/06/2021    6:17 PM 09/13/2020    9:32 AM  CBC  WBC 4.0 - 10.5 K/uL 6.8  8.6  4.9   Hemoglobin 12.0 - 15.0 g/dL 70.6  23.7  62.8   Hematocrit 36.0 - 46.0 % 34.6  35.4  31.4   Platelets 150.0 - 400.0  K/uL 539.0 Repeated and verified X2.  311  305        Latest Ref Rng & Units 08/22/2022    1:34 PM 11/24/2021   10:04 AM 09/06/2021    6:17 PM  CMP  Glucose 70 - 99 mg/dL 82  93  92   BUN 6 - 23 mg/dL 19  16  15    Creatinine 0.40 - 1.20 mg/dL 1.40  1.43  1.44   Sodium 135 - 145 mEq/L 134  138  135   Potassium 3.5 - 5.1 mEq/L 4.2  4.7  3.9   Chloride 96 - 112 mEq/L 100  106  103   CO2 19 - 32 mEq/L 27  26  21    Calcium 8.4 - 10.5 mg/dL 9.8  9.7  9.5   Total Protein 6.0 - 8.3 g/dL 8.7   8.0   Total Bilirubin 0.2 - 1.2 mg/dL 0.3   0.8   Alkaline Phos 39 - 117 U/L 72   77   AST 0 - 37 U/L 32   21   ALT 0 - 35 U/L 38   24      ASSESSMENT & PLAN Patty Gilmore 32 y.o. female with medical history significant for hypertension and prediabetes who presents for evaluation of mild anemia and thrombocytosis.  After review of Patty labs, review of Patty records, and discussion with Patty Gilmore Patty patients findings are most consistent with thrombocytosis and mild anemia, likely iron deficiency.   There are two types of thrombocytosis, essential thrombocytosis and secondary thrombocytosis. Essential thrombocytosis is overproduction of platelets due to a driver mutation. Patty most common mutation is Patty JAK2 V617F (50% of cases), but other causes include CALR, MPL, and mutations of unclear significance on NGS. Essential thrombocytosis is a  myeloproliferative neoplasm which may require cytoreductive therapy do decrease risk of thrombosis. Secondary thrombocytosis can be caused by low iron levels, increased inflammation,  or splenectomy.  Our workup will focus on determining if there is a secondary cause of this Gilmore's thrombocytosis.    #Thrombocytosis, likely 2/2 to Iron Deficiency # Mild Normocytic Anemia  --workup to include CBC, CMP, ESR/CRP, and iron panel/ferritin  --Gilmore has no history of splenectomy     --will order MPN panels to include JAK2 with reflex and BCR/ABL FISH -- Gilmore is unable to tolerate p.o. iron therapy due to constipation.  If iron is required we will need to proceed with IV iron therapy. --RTC in 3 months or sooner if above workup is unrevealing.   Orders Placed This Encounter  Procedures   CBC with Differential ([place] Only)    Standing Status:   Future    Number of Occurrences:   1    Standing Expiration Date:   10/10/2023   CMP ([place] only)    Standing Status:   Future    Number of Occurrences:   1    Standing Expiration Date:   10/10/2023   Ferritin    Standing Status:   Future    Number of Occurrences:   1    Standing Expiration Date:   10/10/2023   Iron and Iron Binding Capacity ([facility] only)    Standing Status:   Future    Number of Occurrences:   1    Standing Expiration Date:   10/10/2023   Retic Panel    Standing Status:   Future    Number of Occurrences:   1    Standing Expiration Date:  10/10/2023   Sedimentation rate    Standing Status:   Future    Number of Occurrences:   1    Standing Expiration Date:   10/09/2023   C-reactive protein    Standing Status:   Future    Number of Occurrences:   1    Standing Expiration Date:   10/09/2023   JAK2 (INCLUDING V617F AND EXON 12), MPL,& CALR W/RFL MPN PANEL (NGS)    Standing Status:   Future    Number of Occurrences:   1    Standing Expiration Date:   10/09/2023   BCR ABL1 FISH ([facility])    Standing Status:   Future     Number of Occurrences:   1    Standing Expiration Date:   10/10/2023    All questions were answered. Patty Gilmore knows to call Patty clinic with any problems, questions or concerns.  A total of more than 60 minutes were spent on this encounter with face-to-face time and non-face-to-face time, including preparing to see Patty Gilmore, ordering tests and/or medications, counseling Patty Gilmore and coordination of care as outlined above.   Ulysees Barns, MD Department of Hematology/Oncology [facility] at [facility] Phone: [phone] Pager: [phone] Email: [email or web address]  10/09/2022 10:16 AM

## 2022-10-10 ENCOUNTER — Telehealth: Payer: Self-pay | Admitting: Hematology and Oncology

## 2022-10-10 NOTE — Telephone Encounter (Signed)
Called patient to notify of new appointments. Patient notified.  

## 2022-10-16 ENCOUNTER — Other Ambulatory Visit: Payer: Self-pay | Admitting: Hematology and Oncology

## 2022-10-16 ENCOUNTER — Other Ambulatory Visit (HOSPITAL_COMMUNITY)
Admission: RE | Admit: 2022-10-16 | Discharge: 2022-10-16 | Disposition: A | Discharge status: Home / Self Care | Payer: Commercial Managed Care - PPO | Source: Ambulatory Visit | Attending: Family Medicine | Admitting: Family Medicine

## 2022-10-16 ENCOUNTER — Encounter: Payer: Self-pay | Admitting: Family Medicine

## 2022-10-16 ENCOUNTER — Ambulatory Visit (INDEPENDENT_AMBULATORY_CARE_PROVIDER_SITE_OTHER): Payer: Commercial Managed Care - PPO | Admitting: Family Medicine

## 2022-10-16 ENCOUNTER — Telehealth: Payer: Self-pay

## 2022-10-16 VITALS — BP 116/70 | HR 76 | Temp 97.8°F | Ht 63.0 in | Wt 218.4 lb

## 2022-10-16 DIAGNOSIS — N760 Acute vaginitis: Secondary | ICD-10-CM | POA: Diagnosis not present

## 2022-10-16 DIAGNOSIS — B3731 Acute candidiasis of vulva and vagina: Secondary | ICD-10-CM

## 2022-10-16 DIAGNOSIS — B9689 Other specified bacterial agents as the cause of diseases classified elsewhere: Secondary | ICD-10-CM

## 2022-10-16 DIAGNOSIS — D500 Iron deficiency anemia secondary to blood loss (chronic): Secondary | ICD-10-CM

## 2022-10-16 LAB — JAK2 (INCLUDING V617F AND EXON 12), MPL,& CALR W/RFL MPN PANEL (NGS)

## 2022-10-16 LAB — BCR ABL1 FISH (GENPATH)

## 2022-10-16 MED ORDER — FLUCONAZOLE 150 MG PO TABS: 150.0000 mg | ORAL_TABLET | Freq: Once | ORAL | 0 refills | Status: AC

## 2022-10-16 NOTE — Telephone Encounter (Signed)
Patient called requesting results from most recent labs.

## 2022-10-16 NOTE — Progress Notes (Signed)
  [facility] [address] [place] [address] Dept: [phone] Dept Fax: [phone]  Office Visit  Subjective:    Patient ID: Patty Gilmore, female    DOB: April 06, 1991, 32 y.o..   MRN: [id number]  Chief Complaint  Patient presents with   Acute Visit    C/o having vaginal itching/discharge x 1 week.      History of Present Illness:  Patient is in today complaining of a 1-week history of vaginal itching. She has had some associated yellowish discharge. She has not tried any treatment for this at present. She has had past yeast infections, but she notes this feels different.  Past Medical History: Patient Active Problem List   Diagnosis Date Noted   Normochromic normocytic anemia 09/01/2022   Prediabetes 09/01/2022   Infected pilonidal cyst 08/15/2022   Family history of high blood pressure 11/24/2021   Primary hypertension 11/24/2021   CKD (chronic kidney disease) stage 3, GFR 30-59 ml/min (HCC) 11/24/2021   GAD (generalized anxiety disorder) 11/24/2021   Acute pharyngitis 04/17/2018   Acanthosis nigricans 01/16/2012   LVH (left ventricular hypertrophy) 01/16/2012   Past Surgical History:  Procedure Laterality Date   CESAREAN SECTION N/A 05/04/2016   Procedure: CESAREAN SECTION;  Surgeon: Jonnie Kind, MD;  Location: [facility];  Service: Obstetrics;  Laterality: N/A;   LUMBAR PUNCTURE  07/20/2014   fluid build up around spinal column   NO PAST SURGERIES     Family History  Problem Relation Age of Onset   Diabetes Maternal Grandmother    Diabetes Paternal Grandmother    Thyroid disease Mother    Outpatient Medications Prior to Visit  Medication Sig Dispense Refill   amLODipine (NORVASC) 10 MG tablet Take 1 tablet (10 mg total) by mouth daily. 90 tablet 3   No facility-administered medications prior to visit.   No Known Allergies    Objective:   Today's Vitals   10/16/22 1257  BP: 116/70   Pulse: 76  Temp: 97.8 F (36.6 C)  TempSrc: Temporal  Weight: 218 lb 6.4 oz (99.1 kg)  Height: 5\' 3"  (1.6 m)   Body mass index is 38.69 kg/m.   General: Well developed, well nourished. No acute distress. GU: Normal external genitalia. Vaginal vault is pink with heavy white, clumpy discharge. Cervix is pink   and appears normal. No CMT. Uterus normal size. No adnexal masses.  Psych: Alert and oriented. Normal mood and affect.  Health Maintenance Due  Topic Date Due   HPV VACCINES (3 - 3-dose series) 11/14/2022     Assessment & Plan:  1. Yeast vaginitis On exam, patient appears to have a yeast infection. This is the 2nd one she has had in the past 5 weeks. I will treat this again with fluconazole. If this recurs in short order, we would discuss other approaches to management.  - Cervicovaginal ancillary only - fluconazole (DIFLUCAN) 150 MG tablet; Take 1 tablet (150 mg total) by mouth once for 1 dose.  Dispense: 1 tablet; Refill: 0  Return if symptoms worsen or fail to improve.   Haydee Salter, MD

## 2022-10-18 LAB — CERVICOVAGINAL ANCILLARY ONLY
Bacterial Vaginitis (gardnerella): POSITIVE — AB
Candida Glabrata: NEGATIVE
Candida Vaginitis: POSITIVE — AB
Chlamydia: NEGATIVE
Comment: NEGATIVE
Comment: NEGATIVE
Comment: NEGATIVE
Comment: NEGATIVE
Comment: NEGATIVE
Comment: NORMAL
Neisseria Gonorrhea: NEGATIVE
Trichomonas: NEGATIVE

## 2022-10-18 MED ORDER — METRONIDAZOLE 500 MG PO TABS: 500.0000 mg | ORAL_TABLET | Freq: Three times a day (TID) | ORAL | 0 refills | Status: AC

## 2022-10-18 NOTE — Addendum Note (Signed)
Addended by: Haydee Salter on: 10/18/2022 06:15 PM   Modules accepted: Orders

## 2022-10-19 ENCOUNTER — Encounter: Payer: Commercial Managed Care - PPO | Attending: Nurse Practitioner | Admitting: Dietician

## 2022-10-19 ENCOUNTER — Encounter: Payer: Self-pay | Admitting: Dietician

## 2022-10-19 VITALS — Ht 63.0 in

## 2022-10-19 DIAGNOSIS — R7303 Prediabetes: Secondary | ICD-10-CM | POA: Diagnosis not present

## 2022-10-19 NOTE — Patient Instructions (Addendum)
Aim for 150 minutes of physical activity weekly. Goal: Exercise 1x/wk for 30 minutes  Aim to make 1/2 of your plate vegetables and/or fruit at least 2x/day  Aim to eat within 1-2 hours of waking up and every 3-5 hours following.  Goal: eat 3 meals per day every day (even if one of them is a small).   When eating iron-rich foods pair something with vitamin C with it.

## 2022-10-19 NOTE — Progress Notes (Signed)
Medical Nutrition Therapy  Appointment Start time:  6644  [id number] End time:  8756  Primary concerns today: Pt states she wants some guidelines for healthy eating.   Referral diagnosis: prediabetes, primary hypertension Preferred learning style: no preference indicated Learning readiness: ready   NUTRITION ASSESSMENT   Anthropometrics  Ht: 63in Wt 10/19/22: not assessed  Clinical Medical Hx: HTN, iron deficiency anemia, prediabetes Medications: reviewed Labs: 10/09/22: hemoglobin 10.8, 08/22/22 A1c 6.1% Notable Signs/Symptoms: fatigue and cold related to iron deficiency Food Allergies: none  Lifestyle & Dietary Hx  Pt works at [employer] with random shifts either morning, mid shift or 2nd shift until midnight. Pt reports this effects her sleeping and eating schedule.  Pt has a 75 year old daughter.   Pt states she enjoys walking and dancing but hasn't been walking as much since its been cold.   Pt states sometimes she only eats once or twice per day because she sleeps a lot.   Pt reports she had a kidney infection and used to drink a lot of mountain dew and cappuccinos but since quit drinking these and now drinks more water.   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   24-Hr Dietary Recall First Meal: none OR rarely weekend waffles, sausage and eggs Snack: none Second Meal: grilled chicken sandwich with fries or fruit cup Snack: none Third Meal: none OR popeyes grilled chicken sandwich OR chef salad from work Snack: bbq chicken, cabbage and stuffing OR sun chips Beverages: sweet tea and lemonade, water, sweet tea   NUTRITION DIAGNOSIS  [place]-2.2 Altered nutrition-related laboratory As related to iron deficiency  anemia.  As evidenced by hemoglobin 10.8.   NUTRITION INTERVENTION  Nutrition education (E-1) on the following topics:  Building balanced meals and snacks Iron deficiency nutrition therapy High iron food sources Pairing vitamin C with iron rich foods Fluid/hydration needs Impacts of stress on health and stress management strategies Impact of physical activity on blood glucose  Handouts Provided Include  Nutrition Care Manual: Iron Deficiency Anemia Nutrition Therapy NKD national kidney diet - Dish up a Kidney-Friendly Meal for Patients with Chronic Kidney Disease (not on dialysis) Meal Ideas  Learning Style & Readiness for Change Teaching method utilized: Visual & Auditory  Demonstrated degree of understanding via: Teach Back  Barriers to learning/adherence to lifestyle change: none  Goals Established by Pt Aim for 150 minutes of physical activity weekly. Goal: Exercise 1x/wk for 30 minutes  Aim to make 1/2 of your plate vegetables and/or fruit at least 2x/day  Aim to eat within 1-2 hours of waking up and every 3-5 hours following.  Goal: eat 3 meals per day every day (even if one of them is a small).   When eating iron-rich foods pair something with vitamin C with it.    MONITORING & EVALUATION Dietary intake, weekly physical activity, and follow up in 3 months.  Next Steps  Patient is to call for questions.

## 2022-10-20 ENCOUNTER — Telehealth: Payer: Self-pay | Admitting: Pharmacy Technician

## 2022-10-20 NOTE — Telephone Encounter (Signed)
Dr. Lorenso Courier,  Patient's insurance is [facility] and they require step therapy for IV iron. Preferred medication is Iron sucrose and I do not see any documentation in chart for inefficacy with iron sucrose. Can we switch to venofer (iron sucrose) instead?  Thanks, [employer]

## 2022-10-24 ENCOUNTER — Ambulatory Visit (INDEPENDENT_AMBULATORY_CARE_PROVIDER_SITE_OTHER): Payer: Commercial Managed Care - PPO

## 2022-10-24 VITALS — BP 127/82 | HR 63 | Temp 98.5°F | Resp 18 | Ht 63.0 in | Wt 223.6 lb

## 2022-10-24 DIAGNOSIS — D649 Anemia, unspecified: Secondary | ICD-10-CM

## 2022-10-24 DIAGNOSIS — D500 Iron deficiency anemia secondary to blood loss (chronic): Secondary | ICD-10-CM

## 2022-10-24 MED ORDER — SODIUM CHLORIDE 0.9 % IV SOLN
200.0000 mg | Freq: Once | INTRAVENOUS | Status: AC
  Administered 2022-10-24: 200 mg via INTRAVENOUS
  Filled 2022-10-24: qty 10

## 2022-10-24 MED ORDER — ACETAMINOPHEN 325 MG PO TABS
650.0000 mg | ORAL_TABLET | Freq: Once | ORAL | Status: AC
  Administered 2022-10-24: 650 mg via ORAL
  Filled 2022-10-24: qty 2

## 2022-10-24 MED ORDER — DIPHENHYDRAMINE HCL 25 MG PO CAPS
25.0000 mg | ORAL_CAPSULE | Freq: Once | ORAL | Status: AC
  Administered 2022-10-24: 25 mg via ORAL
  Filled 2022-10-24: qty 1

## 2022-10-24 NOTE — Patient Instructions (Signed)
Iron Sucrose Injection What is this medication? IRON SUCROSE (EYE ern SOO krose) treats low levels of iron (iron deficiency anemia) in people with kidney disease. Iron is a mineral that plays an important role in making red blood cells, which carry oxygen from your lungs to the rest of your body. This medicine may be used for other purposes; ask your health care provider or pharmacist if you have questions. COMMON BRAND NAME(S): Venofer What should I tell my care team before I take this medication? They need to know if you have any of these conditions: Anemia not caused by low iron levels Heart disease High levels of iron in the blood Kidney disease Liver disease An unusual or allergic reaction to iron, other medications, foods, dyes, or preservatives Pregnant or trying to get pregnant Breastfeeding How should I use this medication? This medication is for infusion into a vein. It is given in a hospital or clinic setting. Talk to your care team about the use of this medication in children. While this medication may be prescribed for children as young as 2 years for selected conditions, precautions do apply. Overdosage: If you think you have taken too much of this medicine contact a poison control center or emergency room at once. NOTE: This medicine is only for you. Do not share this medicine with others. What if I miss a dose? Keep appointments for follow-up doses. It is important not to miss your dose. Call your care team if you are unable to keep an appointment. What may interact with this medication? Do not take this medication with any of the following: Deferoxamine Dimercaprol Other iron products This medication may also interact with the following: Chloramphenicol Deferasirox This list may not describe all possible interactions. Give your health care provider a list of all the medicines, herbs, non-prescription drugs, or dietary supplements you use. Also tell them if you smoke,  drink alcohol, or use illegal drugs. Some items may interact with your medicine. What should I watch for while using this medication? Visit your care team regularly. Tell your care team if your symptoms do not start to get better or if they get worse. You may need blood work done while you are taking this medication. You may need to follow a special diet. Talk to your care team. Foods that contain iron include: whole grains/cereals, dried fruits, beans, or peas, leafy green vegetables, and organ meats (liver, kidney). What side effects may I notice from receiving this medication? Side effects that you should report to your care team as soon as possible: Allergic reactions--skin rash, itching, hives, swelling of the face, lips, tongue, or throat Low blood pressure--dizziness, feeling faint or lightheaded, blurry vision Shortness of breath Side effects that usually do not require medical attention (report to your care team if they continue or are bothersome): Flushing Headache Joint pain Muscle pain Nausea Pain, redness, or irritation at injection site This list may not describe all possible side effects. Call your doctor for medical advice about side effects. You may report side effects to FDA at 1-800-FDA-1088. Where should I keep my medication? This medication is given in a hospital or clinic and will not be stored at home. NOTE: This sheet is a summary. It may not cover all possible information. If you have questions about this medicine, talk to your doctor, pharmacist, or health care provider.  2023 Elsevier/Gold Standard (2020-12-30 00:00:00)

## 2022-10-24 NOTE — Progress Notes (Signed)
Diagnosis: Iron Deficiency Anemia  Provider:  Marshell Garfinkel MD  Procedure: Infusion  IV Type: Peripheral, IV Location: R Hand  Venofer (Iron Sucrose), Dose: 200 mg  Infusion Start Time: 1011  Infusion Stop Time: 6546  Post Infusion IV Care: Observation period completed and Peripheral IV Discontinued  Discharge: Condition: Good, Destination: Home . AVS provided to patient.   Performed by:  Cleophus Molt, RN

## 2022-10-31 ENCOUNTER — Ambulatory Visit (INDEPENDENT_AMBULATORY_CARE_PROVIDER_SITE_OTHER): Payer: Commercial Managed Care - PPO

## 2022-10-31 VITALS — BP 112/78 | HR 77 | Temp 98.1°F | Resp 18 | Ht 63.0 in | Wt 221.4 lb

## 2022-10-31 DIAGNOSIS — D500 Iron deficiency anemia secondary to blood loss (chronic): Secondary | ICD-10-CM | POA: Diagnosis not present

## 2022-10-31 MED ORDER — SODIUM CHLORIDE 0.9 % IV SOLN
200.0000 mg | Freq: Once | INTRAVENOUS | Status: AC
  Administered 2022-10-31: 200 mg via INTRAVENOUS
  Filled 2022-10-31: qty 10

## 2022-10-31 MED ORDER — DIPHENHYDRAMINE HCL 25 MG PO CAPS
25.0000 mg | ORAL_CAPSULE | Freq: Once | ORAL | Status: AC
  Administered 2022-10-31: 25 mg via ORAL
  Filled 2022-10-31: qty 1

## 2022-10-31 MED ORDER — ACETAMINOPHEN 325 MG PO TABS
650.0000 mg | ORAL_TABLET | Freq: Once | ORAL | Status: AC
  Administered 2022-10-31: 650 mg via ORAL
  Filled 2022-10-31: qty 2

## 2022-10-31 NOTE — Progress Notes (Signed)
Diagnosis: Iron Deficiency Anemia  Provider:  Marshell Garfinkel MD  Procedure: Infusion  IV Type: Peripheral, IV Location: L Antecubital  Venofer (Iron Sucrose), Dose: 200 mg  Infusion Start Time: 6945  Infusion Stop Time: 0388  Post Infusion IV Care: Peripheral IV Discontinued  Discharge: Condition: Good, Destination: Home . AVS provided to patient.   Performed by:  Paul Dykes, RN

## 2022-11-07 ENCOUNTER — Ambulatory Visit (INDEPENDENT_AMBULATORY_CARE_PROVIDER_SITE_OTHER): Payer: Commercial Managed Care - PPO

## 2022-11-07 VITALS — BP 125/83 | HR 65 | Temp 98.1°F | Resp 18 | Ht 63.0 in | Wt 222.6 lb

## 2022-11-07 DIAGNOSIS — D500 Iron deficiency anemia secondary to blood loss (chronic): Secondary | ICD-10-CM

## 2022-11-07 MED ORDER — SODIUM CHLORIDE 0.9 % IV SOLN
200.0000 mg | Freq: Once | INTRAVENOUS | Status: AC
  Administered 2022-11-07: 200 mg via INTRAVENOUS
  Filled 2022-11-07: qty 10

## 2022-11-07 MED ORDER — ACETAMINOPHEN 325 MG PO TABS
650.0000 mg | ORAL_TABLET | Freq: Once | ORAL | Status: AC
  Administered 2022-11-07: 650 mg via ORAL
  Filled 2022-11-07: qty 2

## 2022-11-07 MED ORDER — DIPHENHYDRAMINE HCL 25 MG PO CAPS: 25.0000 mg | ORAL_CAPSULE | Freq: Once | ORAL | Status: DC

## 2022-11-07 NOTE — Progress Notes (Signed)
Diagnosis: Iron Deficiency Anemia  Provider:  Marshell Garfinkel MD  Procedure: Infusion  IV Type: Peripheral, IV Location: R Antecubital  Venofer (Iron Sucrose), Dose: 200 mg  Infusion Start Time: 1155  Infusion Stop Time: 2080  Post Infusion IV Care: Peripheral IV Discontinued  Discharge: Condition: Good, Destination: Home . AVS provided to patient.   Performed by:  Paul Dykes, RN

## 2022-11-14 ENCOUNTER — Ambulatory Visit (INDEPENDENT_AMBULATORY_CARE_PROVIDER_SITE_OTHER): Payer: Commercial Managed Care - PPO

## 2022-11-14 VITALS — BP 127/82 | HR 60 | Temp 98.5°F | Resp 16 | Ht 63.0 in | Wt 223.0 lb

## 2022-11-14 DIAGNOSIS — D509 Iron deficiency anemia, unspecified: Secondary | ICD-10-CM

## 2022-11-14 DIAGNOSIS — D500 Iron deficiency anemia secondary to blood loss (chronic): Secondary | ICD-10-CM

## 2022-11-14 MED ORDER — SODIUM CHLORIDE 0.9 % IV SOLN
200.0000 mg | Freq: Once | INTRAVENOUS | Status: AC
  Administered 2022-11-14: 200 mg via INTRAVENOUS
  Filled 2022-11-14: qty 10

## 2022-11-14 MED ORDER — DIPHENHYDRAMINE HCL 25 MG PO CAPS: 25.0000 mg | ORAL_CAPSULE | Freq: Once | ORAL | Status: DC

## 2022-11-14 MED ORDER — ACETAMINOPHEN 325 MG PO TABS: 650.0000 mg | ORAL_TABLET | Freq: Once | ORAL | Status: DC

## 2022-11-14 NOTE — Progress Notes (Signed)
Diagnosis: Iron Deficiency Anemia  Provider:  Marshell Garfinkel MD  Procedure: Infusion  IV Type: Peripheral, IV Location: R Forearm  Venofer (Iron Sucrose), Dose: 200 mg  Infusion Start Time: 1000  Infusion Stop Time: 1020  Post Infusion IV Care: Peripheral IV Discontinued  Discharge: Condition: Good, Destination: Home . AVS Provided and AVS Declined  Performed by:  Cleophus Molt, RN

## 2022-11-20 ENCOUNTER — Ambulatory Visit (INDEPENDENT_AMBULATORY_CARE_PROVIDER_SITE_OTHER): Payer: Commercial Managed Care - PPO | Admitting: Family Medicine

## 2022-11-20 ENCOUNTER — Encounter: Payer: Self-pay | Admitting: Family Medicine

## 2022-11-20 VITALS — BP 128/82 | HR 70 | Temp 97.0°F | Wt 225.4 lb

## 2022-11-20 DIAGNOSIS — L0591 Pilonidal cyst without abscess: Secondary | ICD-10-CM | POA: Diagnosis not present

## 2022-11-20 MED ORDER — DOXYCYCLINE HYCLATE 100 MG PO TABS: 100.0000 mg | ORAL_TABLET | Freq: Two times a day (BID) | ORAL | 0 refills | Status: AC

## 2022-11-20 MED ORDER — NAPROXEN 500 MG PO TABS: 500.0000 mg | ORAL_TABLET | Freq: Two times a day (BID) | ORAL | 0 refills | Status: AC | PRN

## 2022-11-20 NOTE — Assessment & Plan Note (Signed)
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 Previously treated with antibiotics and notified of potential need for surgical intervention if recurrences continue.  Plan:  Start the patient on doxycycline 100 mg twice a day for infection control. Prescribe naproxen 500 mg twice a day for pain and inflammation control. Place an urgent referral to the general surgery department for evaluation and possible incision and drainage or excision of the cyst to prevent future recurrence. Advise the patient to avoid pressure on the affected area to reduce pain and avoid rupture. Schedule a follow-up appointment to re-assess the patient's condition after the initiation of the antibiotic treatment and prior to the surgical consultation. Educate the patient on signs of infection worsening, such as increased pain, redness, or systemic symptoms like fever or chills, and advise to seek immediate medical attention if these occur.

## 2022-11-20 NOTE — Progress Notes (Signed)
Assessment/Plan:   Problem List Items Addressed This Visit       Musculoskeletal and Integument   Infected pilonidal cyst - Primary    Patient presents with a painful, recurrent pilonidal cyst in the left gluteal region, close to the anus. Previously treated with antibiotics and notified of potential need for surgical intervention if recurrences continue.  Plan:  Start the patient on doxycycline 100 mg twice a day for infection control. Prescribe naproxen 500 mg twice a day for pain and inflammation control. Place an urgent referral to the general surgery department for evaluation and possible incision and drainage or excision of the cyst to prevent future recurrence. Advise the patient to avoid pressure on the affected area to reduce pain and avoid rupture. Schedule a follow-up appointment to re-assess the patient's condition after the initiation of the antibiotic treatment and prior to the surgical consultation. Educate the patient on signs of infection worsening, such as increased pain, redness, or systemic symptoms like fever or chills, and advise to seek immediate medical attention if these occur.      Relevant Medications   doxycycline (VIBRA-TABS) 100 MG tablet   naproxen (NAPROSYN) 500 MG tablet   Other Relevant Orders   Ambulatory referral to General Surgery    There are no discontinued medications.    Subjective:  HPI: Encounter date: 11/20/2022  Patty Gilmore is a 32 y.o. female who has Acanthosis nigricans; LVH (left ventricular hypertrophy); Acute pharyngitis; Family history of high blood pressure; Primary hypertension; CKD (chronic kidney disease) stage 3, GFR 30-59 ml/min (HCC); GAD (generalized anxiety disorder); Infected pilonidal cyst; Normochromic normocytic anemia; Prediabetes; and Iron deficiency anemia due to chronic blood loss on their problem list..   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    CHIEF COMPLAINT: Patient presents with a painful cyst in the left gluteal region with a history of previous similar occurrences.  HISTORY OF PRESENT ILLNESS:  Infected Pilonidal Cyst. Patient reports having a reoccurrence of what appears to be a pilonidal cyst on the left gluteal region near the anus. The cyst previously burst a couple of months ago and was treated with antibiotics by Dr. Ethelene Hal. The patient was informed at that time that if the cyst recurred, surgery might be necessary to prevent further episodes.  ROS:  General: No fevers or chills reported. Dermatology: Confirmed presence of cystic lesion in the left gluteal region. Gastrointestinal: No nausea or vomiting. Musculoskeletal: Pain hindering sitting and driving.  Past Surgical History:  Procedure Laterality Date   CESAREAN SECTION N/A 05/04/2016   Procedure: CESAREAN SECTION;  Surgeon: Jonnie Kind, MD;  Location: [facility];  Service: Obstetrics;  Laterality: N/A;   LUMBAR PUNCTURE  07/20/2014   fluid build up around spinal column   NO PAST SURGERIES      Outpatient Medications Prior to Visit  Medication Sig Dispense Refill   amLODipine (NORVASC) 10 MG tablet Take 1 tablet (10 mg total) by mouth daily. 90 tablet 3   No facility-administered medications prior to visit.    Family History  Problem Relation Age of Onset   Diabetes Maternal Grandmother    Diabetes Paternal Grandmother    Thyroid disease Mother     Social History   Socioeconomic History   Marital status: Single    Spouse name: Not on file   Number of children: 1   Years of education: Not on file   Highest  education level: Not on file  Occupational History    Employer: [employer]    Comment: [employer]  Tobacco Use   Smoking status: Never   Smokeless tobacco: Never  Vaping Use   Vaping Use: Never used  Substance and Sexual Activity   Alcohol use: No    Comment: Rarely (less than once  per month)   Drug use: Not Currently    Types: Marijuana   Sexual activity: Not Currently  Other Topics Concern   Not on file  Social History Narrative   Not on file   Social Determinants of Health   Financial Resource Strain: Not on file  Food Insecurity: Not on file  Transportation Needs: Not on file  Physical Activity: Not on file  Stress: Not on file  Social Connections: Not on file  Intimate Partner Violence: Not on file                                                                                                 Objective:  Physical Exam: BP 128/82 (BP Location: Left Arm, Patient Position: Sitting, Cuff Size: Large)   Pulse 70   Temp (!) 97 F (36.1 C) (Temporal)   Wt 225 lb 6.4 oz (102.2 kg)   LMP 10/25/2022   SpO2 99%   BMI 39.93 kg/m    General: No acute distress. Awake and conversant.  Eyes: Normal conjunctiva, anicteric. Round symmetric pupils.  ENT: Hearing grossly intact. No nasal discharge.  Neck: Neck is supple. No masses or thyromegaly.  Respiratory: Respirations are non-labored. No auditory wheezing.  Skin: Left gluteal tender abscess without drainage Psych: Alert and oriented. Cooperative, Appropriate mood and affect, Normal judgment.  CV: No cyanosis or JVD MSK: Normal ambulation. No clubbing  Neuro: Sensation and CN II-XII grossly normal.   Physical Exam       Alesia Banda, MD, MS

## 2022-11-20 NOTE — Patient Instructions (Signed)
We are starting doxycycline as an antibiotic.  May take naproxen for pain.  We are urgently referring you to general surgery to have this lanced.  If you develop severe pain nausea or vomiting or other worrisome symptoms, please go to the emergency department.

## 2022-11-21 ENCOUNTER — Ambulatory Visit: Payer: Commercial Managed Care - PPO

## 2022-11-21 ENCOUNTER — Other Ambulatory Visit: Payer: Self-pay

## 2022-11-21 ENCOUNTER — Encounter (HOSPITAL_COMMUNITY): Payer: Self-pay

## 2022-11-21 ENCOUNTER — Emergency Department (HOSPITAL_COMMUNITY)
Admission: EM | Admit: 2022-11-21 | Discharge: 2022-11-21 | Disposition: A | Discharge status: Home / Self Care | Payer: Commercial Managed Care - PPO | Attending: Emergency Medicine | Admitting: Emergency Medicine

## 2022-11-21 DIAGNOSIS — Z79899 Other long term (current) drug therapy: Secondary | ICD-10-CM | POA: Diagnosis not present

## 2022-11-21 DIAGNOSIS — L0501 Pilonidal cyst with abscess: Secondary | ICD-10-CM

## 2022-11-21 MED ORDER — LIDOCAINE-EPINEPHRINE (PF) 2 %-1:200000 IJ SOLN
10.0000 mL | Freq: Once | INTRAMUSCULAR | Status: AC
  Administered 2022-11-21: 10 mL
  Filled 2022-11-21: qty 20

## 2022-11-21 NOTE — ED Notes (Signed)
Pt has abscess approx the size of a quarter on inner left buttocks near crease of buttocks. The abscess is intact.

## 2022-11-21 NOTE — ED Notes (Signed)
Suture cart at bedside if needed.

## 2022-11-21 NOTE — Discharge Instructions (Signed)
It was a pleasure taking care of you today!   Your abscess was drained in the ED today. There was packing placed to the area, it is important that you remove the packing within 48 hours. You may also present to the ED for packing removal and wound check.  Take the prescription that was sent for doxycycline as directed.  Attached is information for the on-call surgery group, call and set up a follow up appointment regarding todays ED visit. Return to the ED if you are experiencing increasing/worsening symptoms.

## 2022-11-21 NOTE — ED Provider Notes (Signed)
[facility] Provider Note   CSN: [id number] Arrival date & time: 11/21/22  [id number]     History  Chief Complaint  Patient presents with   Abscess    Patty Gilmore is a 32 y.o. female who presents to the ED with concerns for abscess to her left buttock x 1 week. Hasn't had the area drained in the past. Was seen by her PCP yesterday and diagnosed with pilonidal cyst. Was started on doxycycline and naproxen. Pt notes that she hasn't started on the doxy yet. Pt was given a referral to Surgery. Denies fever, drainage.    The history is provided by the patient. No language interpreter was used.       Home Medications Prior to Admission medications   Medication Sig Start Date End Date Taking? Authorizing Provider  amLODipine (NORVASC) 10 MG tablet Take 1 tablet (10 mg total) by mouth daily. 09/01/22   Nche, Charlene Brooke, NP  doxycycline (VIBRA-TABS) 100 MG tablet Take 1 tablet (100 mg total) by mouth 2 (two) times daily for 7 days. 11/20/22 11/27/22  Bonnita Hollow, MD  naproxen (NAPROSYN) 500 MG tablet Take 1 tablet (500 mg total) by mouth 2 (two) times daily as needed for up to 10 days (pain). 11/20/22 11/30/22  Bonnita Hollow, MD      Allergies    Patient has no known allergies.    Review of Systems   Review of Systems  All other systems reviewed and are negative.   Physical Exam Updated Vital Signs BP 118/81   Pulse 65   Temp 98 F (36.7 C) (Oral)   Resp 18   Ht 5' 3"$  (1.6 m)   Wt 102.1 kg   LMP 10/25/2022   SpO2 100%   BMI 39.86 kg/m  Physical Exam Vitals and nursing note reviewed.  Constitutional:      General: She is not in acute distress.    Appearance: Normal appearance.  Eyes:     General: No scleral icterus.    Extraocular Movements: Extraocular movements intact.  Cardiovascular:     Rate and Rhythm: Normal rate.  Pulmonary:     Effort: Pulmonary effort is normal. No respiratory distress.  Abdominal:      Palpations: Abdomen is soft. There is no mass.     Tenderness: There is no abdominal tenderness.  Musculoskeletal:        General: Normal range of motion.     Cervical back: Neck supple.  Skin:    General: Skin is warm and dry.     Findings: No rash.     Comments: RN chaperone Careers information officer present for exam. 1 cm area of fluctuance noted to left gluteal cleft. No surrounding erythema or induration noted. No rectal involvement.  Neurological:     Mental Status: She is alert.     Sensory: Sensation is intact.     Motor: Motor function is intact.  Psychiatric:        Behavior: Behavior normal.     ED Results / Procedures / Treatments   Labs (all labs ordered are listed, but only abnormal results are displayed) Labs Reviewed - No data to display  EKG None  Radiology No results found.  Procedures .Marland KitchenIncision and Drainage  Date/Time: 11/21/2022 9:51 AM  Performed by: Nehemiah Settle, PA-C Authorized by: Nehemiah Settle, PA-C   Consent:    Consent obtained:  Verbal   Consent given by:  Patient  Risks discussed:  Bleeding, incomplete drainage and pain Universal protocol:    Patient identity confirmed:  Verbally with patient and hospital-assigned identification number Location:    Type:  Pilonidal cyst   Size:  1   Location:  Anogenital   Anogenital location:  Pilonidal Pre-procedure details:    Skin preparation:  Povidone-iodine Sedation:    Sedation type:  None Anesthesia:    Anesthesia method:  Local infiltration   Local anesthetic:  Lidocaine 2% WITH epi Procedure type:    Complexity:  Simple Procedure details:    Incision types:  Single straight   Incision depth:  Dermal   Wound management:  Probed and deloculated and irrigated with saline   Drainage:  Purulent and bloody   Drainage amount:  Moderate   Wound treatment:  Wound left open   Packing materials:  1/4 in iodoform gauze Post-procedure details:    Procedure completion:  Tolerated well, no immediate  complications     Medications Ordered in ED Medications  lidocaine-EPINEPHrine (XYLOCAINE W/EPI) 2 %-1:200000 (PF) injection 10 mL (10 mLs Infiltration Given 11/21/22 [id number])    ED Course/ Medical Decision Making/ A&P                             Medical Decision Making Risk Prescription drug management.   Pt presents with concerns for abscess onset 1 week. Vital signs, pt afebrile. On exam, pt with 1 cm area of fluctuance noted to left gluteal cleft. No surrounding erythema or induration noted. No rectal involvement. No acute cardiovascular, respiratory, abdominal exam findings. Differential diagnosis includes cellulitis, abscess, pilonidal cyst.    Additional history obtained:  External records from outside source obtained and reviewed including: Pt was evaluated by her PCP yesterday and started on Rx doxy and naprosyn. Given referral to surgery.   Disposition: Presentation suspicious for pilonidal cyst. Doubt cellulitis at this time. After consideration of the diagnostic results and the patients response to treatment, I feel that the patient would benefit from Discharge home. Information for surgery provided today. Instructed patient to follow up in 48 hours for wound recheck and packing removal. Pt instructed to start taking the Rx doxycycline as prescribed. Supportive care measures and strict return precautions discussed with patient at bedside. Pt acknowledges and verbalizes understanding. Pt appears safe for discharge. Follow up as indicated in discharge paperwork.    This chart was dictated using voice recognition software, Dragon. Despite the best efforts of this provider to proofread and correct errors, errors may still occur which can change documentation meaning.  Final Clinical Impression(s) / ED Diagnoses Final diagnoses:  Pilonidal abscess    Rx / DC Orders ED Discharge Orders     None         Floyd Wade A, PA-C 11/21/22 [id number]    Elgie Congo,  MD 11/21/22 [phone]

## 2022-11-21 NOTE — ED Triage Notes (Signed)
Pt arrived POV from home c/o an abscess on her left buttocks. Pt states she first noticed it about a week ago but it did not start hurting till Saturday. Pt states she had to call out of work Sunday d/t pain and not being able to sit on it.

## 2022-11-24 ENCOUNTER — Ambulatory Visit
Admission: RE | Admit: 2022-11-24 | Discharge: 2022-11-24 | Disposition: A | Discharge status: Home / Self Care | Payer: Commercial Managed Care - PPO | Source: Ambulatory Visit | Attending: Nurse Practitioner | Admitting: Nurse Practitioner

## 2022-11-24 VITALS — BP 118/80 | HR 98 | Temp 98.0°F | Resp 12

## 2022-11-24 DIAGNOSIS — L0501 Pilonidal cyst with abscess: Secondary | ICD-10-CM

## 2022-11-24 NOTE — ED Triage Notes (Signed)
Pt is here for follow up abscess on the tailbone

## 2022-11-24 NOTE — Discharge Instructions (Signed)
Advised to take the doxycycline until completed. Advised to change the dressing twice daily. Advised to utilize Epsom salt soaks to help the area heal.  Advised follow-up PCP or return to urgent care as needed.

## 2022-11-24 NOTE — ED Provider Notes (Signed)
[facility]    CSN: [id number] Arrival date & time: 11/24/22  1223      History   Chief Complaint Chief Complaint  Patient presents with   Follow-up    I had a cyst on my tailbone that was drained at the ER. I was instructed to go to my primary or an urgent care just to have the packing removed and a bandage placed back over it. - Entered by patient    HPI Patty Gilmore is a 32 y.o. female.   32 year old female presents for pilonidal abscess wick removal.  Patient indicates that she is presently here to have the packing removed from pilonidal cyst.  She relates that she had the pilonidal cyst lanced on 11/21/2022.  She indicates that she has been doing well, decreased pain and decreased drainage.  She indicates she is taking the doxycycline twice daily as directed.  She indicates she has not have any fever, chills, nausea or vomiting.  She she is pleased with how the area has been healing.     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   states she had "thickened muscle around her heart"   Hypertension    was taking amlodipine stopped about 1 yr ago - no insurance - had been on meds since age 8   Pregnancy induced hypertension     Patient Active Problem List   Diagnosis Date Noted   Iron deficiency anemia due to chronic blood loss 10/16/2022   Normochromic normocytic anemia 09/01/2022   Prediabetes 09/01/2022   Infected pilonidal cyst 08/15/2022   Family history of high blood pressure 11/24/2021   Primary hypertension 11/24/2021   CKD (chronic kidney disease) stage 3, GFR 30-59 ml/min (HCC) 11/24/2021   GAD (generalized anxiety disorder) 11/24/2021   Acute pharyngitis 04/17/2018   Acanthosis nigricans 01/16/2012   LVH (left ventricular hypertrophy) 01/16/2012    Past Surgical History:  Procedure Laterality Date   CESAREAN SECTION N/A 05/04/2016   Procedure: CESAREAN SECTION;  Surgeon: Jonnie Kind, MD;  Location:  [facility];  Service: Obstetrics;  Laterality: N/A;   LUMBAR PUNCTURE  07/20/2014   fluid build up around spinal column   NO PAST SURGERIES      OB History     Gravida  1   Para  1   Term  0   Preterm  1   AB  0   Living  1      SAB  0   IAB  0   Ectopic  0   Multiple  0   Live Births  1            Home Medications    Prior to Admission medications   Medication Sig Start Date End Date Taking? Authorizing Provider  amLODipine (NORVASC) 10 MG tablet Take 1 tablet (10 mg total) by mouth daily. 09/01/22  Yes Nche, Charlene Brooke, NP  doxycycline (VIBRA-TABS) 100 MG tablet Take 1 tablet (100 mg total) by mouth 2 (two) times daily for 7 days. 11/20/22 11/27/22 Yes Bonnita Hollow, MD  naproxen (NAPROSYN) 500 MG tablet Take 1 tablet (500 mg total) by mouth 2 (two) times daily as needed for up to 10 days (pain). 11/20/22 11/30/22 Yes Bonnita Hollow, MD    Family History Family History  Problem Relation Age of Onset   Diabetes Maternal Grandmother    Diabetes Paternal Grandmother    Thyroid disease Mother  Social History Social History   Tobacco Use   Smoking status: Never   Smokeless tobacco: Never  Vaping Use   Vaping Use: Never used  Substance Use Topics   Alcohol use: No    Comment: Rarely (less than once per month)   Drug use: Not Currently    Types: Marijuana     Allergies   Patient has no known allergies.   Review of Systems Review of Systems  Skin:  Positive for wound (pilonidal cyst).     Physical Exam Triage Vital Signs ED Triage Vitals  Enc Vitals Group     BP 11/24/22 1232 118/80     Pulse Rate 11/24/22 1232 98     Resp 11/24/22 1232 12     Temp 11/24/22 1232 98 F (36.7 C)     Temp Source 11/24/22 1232 Oral     SpO2 11/24/22 1232 99 %     Weight --      Height --      Head Circumference --      Peak Flow --      Pain Score 11/24/22 1233 0     Pain Loc --      Pain Edu? --      Excl. in [place]? --    No data  found.  Updated Vital Signs BP 118/80 (BP Location: Right Arm)   Pulse 98   Temp 98 F (36.7 C) (Oral)   Resp 12   LMP 10/25/2022   SpO2 99%   Visual Acuity Right Eye Distance:   Left Eye Distance:   Bilateral Distance:    Right Eye Near:   Left Eye Near:    Bilateral Near:     Physical Exam Constitutional:      Appearance: Normal appearance.  Skin:         Comments: Gluteal: Pilonidal cyst is healing well with no evidence of packing present, the cyst tract is healing well with minimal redness, mild clear drainage present.  There is no streaking.  Neurological:     Mental Status: She is alert.      UC Treatments / Results  Labs (all labs ordered are listed, but only abnormal results are displayed) Labs Reviewed - No data to display  EKG   Radiology No results found.  Procedures Procedures (including critical care time)  Medications Ordered in UC Medications - No data to display  Initial Impression / Assessment and Plan / UC Course  I have reviewed the triage vital signs and the nursing notes.  Pertinent labs & imaging results that were available during my care of the patient were reviewed by me and considered in my medical decision making (see chart for details).    Plan: The diagnosis to be treated with the following: 1.  Pilonidal abscess: A.  Advised patient to complete the doxycycline prescription. B.  Advised to change the dressing frequently at least twice daily. 2.  Advised follow-up PCP or return to urgent care as needed. Final Clinical Impressions(s) / UC Diagnoses   Final diagnoses:  Pilonidal abscess     Discharge Instructions      Advised to take the doxycycline until completed. Advised to change the dressing twice daily. Advised to utilize Epsom salt soaks to help the area heal.  Advised follow-up PCP or return to urgent care as needed.    ED Prescriptions   None    PDMP not reviewed this encounter.   Mercia, [place],  PA-C 11/24/22 1304

## 2022-11-30 ENCOUNTER — Ambulatory Visit: Payer: Commercial Managed Care - PPO

## 2022-11-30 MED ORDER — SODIUM CHLORIDE 0.9 % IV SOLN
200.0000 mg | Freq: Once | INTRAVENOUS | Status: AC
  Filled 2022-11-30: qty 10

## 2022-11-30 MED ORDER — ACETAMINOPHEN 325 MG PO TABS: 650.0000 mg | ORAL_TABLET | Freq: Once | ORAL | Status: AC

## 2022-11-30 MED ORDER — DIPHENHYDRAMINE HCL 25 MG PO CAPS: 25.0000 mg | ORAL_CAPSULE | Freq: Once | ORAL | Status: AC

## 2022-12-04 ENCOUNTER — Ambulatory Visit (INDEPENDENT_AMBULATORY_CARE_PROVIDER_SITE_OTHER): Payer: Commercial Managed Care - PPO

## 2022-12-04 VITALS — BP 130/87 | HR 74 | Temp 98.2°F | Resp 16 | Ht 63.0 in | Wt 221.4 lb

## 2022-12-04 DIAGNOSIS — D500 Iron deficiency anemia secondary to blood loss (chronic): Secondary | ICD-10-CM

## 2022-12-04 DIAGNOSIS — D509 Iron deficiency anemia, unspecified: Secondary | ICD-10-CM | POA: Diagnosis not present

## 2022-12-04 MED ORDER — ACETAMINOPHEN 325 MG PO TABS: 650.0000 mg | ORAL_TABLET | Freq: Once | ORAL | Status: DC

## 2022-12-04 MED ORDER — DIPHENHYDRAMINE HCL 25 MG PO CAPS: 25.0000 mg | ORAL_CAPSULE | Freq: Once | ORAL | Status: DC

## 2022-12-04 MED ORDER — SODIUM CHLORIDE 0.9 % IV SOLN
200.0000 mg | Freq: Once | INTRAVENOUS | Status: AC
  Administered 2022-12-04: 200 mg via INTRAVENOUS
  Filled 2022-12-04: qty 10

## 2022-12-04 NOTE — Progress Notes (Signed)
Diagnosis: Iron Deficiency Anemia  Provider:  Marshell Garfinkel MD  Procedure: Infusion  IV Type: Peripheral, IV Location: R Antecubital  Venofer (Iron Sucrose), Dose: 200 mg  Infusion Start Time: [id number]  Infusion Stop Time: [id number]  Post Infusion IV Care: Peripheral IV Discontinued  Discharge: Condition: Good, Destination: Home . AVS Provided and AVS Declined  Performed by:  Adelina Mings, LPN

## 2022-12-07 ENCOUNTER — Ambulatory Visit: Payer: Commercial Managed Care - PPO | Admitting: Nurse Practitioner

## 2022-12-07 ENCOUNTER — Encounter: Payer: Self-pay | Admitting: Nurse Practitioner

## 2022-12-07 VITALS — BP 120/78 | HR 92 | Temp 98.1°F | Resp 16 | Ht 68.0 in | Wt 221.6 lb

## 2022-12-07 DIAGNOSIS — N760 Acute vaginitis: Secondary | ICD-10-CM | POA: Diagnosis not present

## 2022-12-07 DIAGNOSIS — Z23 Encounter for immunization: Secondary | ICD-10-CM | POA: Diagnosis not present

## 2022-12-07 DIAGNOSIS — I10 Essential (primary) hypertension: Secondary | ICD-10-CM | POA: Diagnosis not present

## 2022-12-07 DIAGNOSIS — N1831 Chronic kidney disease, stage 3a: Secondary | ICD-10-CM | POA: Diagnosis not present

## 2022-12-07 DIAGNOSIS — R7303 Prediabetes: Secondary | ICD-10-CM | POA: Diagnosis not present

## 2022-12-07 LAB — POCT GLYCOSYLATED HEMOGLOBIN (HGB A1C): Hemoglobin A1C: 5.4 % (ref 4.0–5.6)

## 2022-12-07 MED ORDER — CLINDAMYCIN PHOSPHATE 2 % VA CREA: 1.0000 | TOPICAL_CREAM | Freq: Every day | VAGINAL | 0 refills | Status: DC

## 2022-12-07 NOTE — Assessment & Plan Note (Signed)
Repeat hgbA1c: 5.4% improved

## 2022-12-07 NOTE — Assessment & Plan Note (Signed)
Persistent vaginal discharge and itching despite treatment with diflucan and metronidazole 71monthago. No rash noted She has not been sexually active in last 159monthWet prep completed: positive BV and yeast. Negative GC/chlamydia and trichomonas She denies any douching, use of pantyliner outside of menstrual cycle, use of of body deodorant She admits to use of Use scented detergent.  Sent clindamycin vaginal cream x 7days F/up with GYN if no improvement

## 2022-12-07 NOTE — Assessment & Plan Note (Signed)
BP at goal with amlodipine BP Readings from Last 3 Encounters:  12/07/22 120/78  12/04/22 130/87  11/24/22 118/80    Maintain med dose

## 2022-12-07 NOTE — Patient Instructions (Addendum)
Wash underwear only with unscented soap Avoid use of any fabric softener or dryer sheets Schedule appt with GYN if no improvement in 1week Maintain current med doses hgbA1c at 5.4%: normal

## 2022-12-07 NOTE — Progress Notes (Signed)
Established Patient Visit  Patient: Patty Gilmore   DOB: 1991/05/09   32 y.o. Female  MRN: [id number] Visit Date: 12/07/2022  Subjective:    Chief Complaint  Patient presents with   Hypertension   Hyperglycemia    Pt has not bp readings    Hypertension  Hyperglycemia   Acute vaginitis Persistent vaginal discharge and itching despite treatment with diflucan and metronidazole 58monthago. No rash noted She has not been sexually active in last 1872monthWet prep completed: positive BV and yeast. Negative GC/chlamydia and trichomonas She denies any douching, use of pantyliner outside of menstrual cycle, use of of body deodorant She admits to use of Use scented detergent.  Sent clindamycin vaginal cream x 7days F/up with GYN if no improvement  CKD (chronic kidney disease) stage 3, GFR 30-59 ml/min (HCC) Stable renal function Repeat labs in 3-72m31monthPrimary hypertension BP at goal with amlodipine BP Readings from Last 3 Encounters:  12/07/22 120/78  12/04/22 130/87  11/24/22 118/80    Maintain med dose  Prediabetes Repeat hgbA1c: 5.4% improved  Reviewed medical, surgical, and social history today  Medications: Outpatient Medications Prior to Visit  Medication Sig   amLODipine (NORVASC) 10 MG tablet Take 1 tablet (10 mg total) by mouth daily.   Facility-Administered Medications Prior to Visit  Medication Dose Route Frequency Provider   acetaminophen (TYLENOL) tablet 650 mg  650 mg Oral Once PatTresa ResPH   diphenhydrAMINE (BENADRYL) capsule 25 mg  25 mg Oral Once PatTresa ResPHYork [facility]oscopy[facility] sucrose (VENOFER) 200 mg in sodium chloride 0.9 % 100 mL IVPB  200 mg Intravenous Once PatTresa ResPHKaiser Permanente Sunnybrook Surgery CenterReviewed past medical and social history.   ROS per HPI above  Last CBC Lab Results  Component Value Date   WBC 5.5 10/09/2022   HGB 10.8 (L) 10/09/2022   HCT 32.0 (L) 10/09/2022   MCV 95.2 10/09/2022   MCH 32.1 10/09/2022   RDW 13.8 10/09/2022    PLT 306 [id number] metabolic panel Lab Results  Component Value Date   GLUCOSE 82 10/09/2022   NA 136 10/09/2022   K 4.0 10/09/2022   CL 105 10/09/2022   CO2 23 10/09/2022   BUN 10 10/09/2022   CREATININE 1.28 (H) 10/09/2022   GFRNONAA 58 (L) 10/09/2022   CALCIUM 9.8 10/09/2022   PROT 7.5 10/09/2022   ALBUMIN 4.4 10/09/2022   LABGLOB 2.8 09/13/2020   AGRATIO 1.5 09/13/2020   BILITOT 0.4 10/09/2022   ALKPHOS 71 10/09/2022   AST 25 10/09/2022   ALT 21 10/09/2022   ANIONGAP 8 10/09/2022   Last lipids Lab Results  Component Value Date   CHOL 144 08/22/2022   HDL 40.30 08/22/2022   LDLCALC 90 08/22/2022   TRIG 72.0 08/22/2022   CHOLHDL 4 08/22/2022      Objective:  BP 120/78   Pulse 92   Temp 98.1 F (36.7 C) (Temporal)   Resp 16   Ht '5\' 8"'$  (1.727 m)   Wt 221 lb 9.6 oz (100.5 kg)   LMP 11/27/2022 (Approximate)   SpO2 97%   BMI 33.69 kg/m      Physical Exam Vitals reviewed.  Cardiovascular:     Rate and Rhythm: Normal rate and regular rhythm.     Pulses: Normal pulses.     Heart sounds: Normal heart sounds.  Pulmonary:     Effort: Pulmonary effort  is normal.     Breath sounds: Normal breath sounds.  Musculoskeletal:     Right lower leg: No edema.     Left lower leg: No edema.  Neurological:     Mental Status: She is alert and oriented to person, place, and time.     Results for orders placed or performed in visit on 12/07/22  POCT glycosylated hemoglobin (Hb A1C)  Result Value Ref Range   Hemoglobin A1C 5.4 4.0 - 5.6 %   HbA1c POC (<> result, manual entry)     HbA1c, POC (prediabetic range)     HbA1c, POC (controlled diabetic range)        Assessment & Plan:    Problem List Items Addressed This Visit       Cardiovascular and Mediastinum   Primary hypertension - Primary    BP at goal with amlodipine BP Readings from Last 3 Encounters:  12/07/22 120/78  12/04/22 130/87  11/24/22 118/80    Maintain med dose        Genitourinary    Acute vaginitis    Persistent vaginal discharge and itching despite treatment with diflucan and metronidazole 60monthago. No rash noted She has not been sexually active in last 153monthWet prep completed: positive BV and yeast. Negative GC/chlamydia and trichomonas She denies any douching, use of pantyliner outside of menstrual cycle, use of of body deodorant She admits to use of Use scented detergent.  Sent clindamycin vaginal cream x 7days F/up with GYN if no improvement      Relevant Medications   clindamycin (CLEOCIN) 2 % vaginal cream   CKD (chronic kidney disease) stage 3, GFR 30-59 ml/min (HCC)    Stable renal function Repeat labs in 3-66m24month      Other   Prediabetes    Repeat hgbA1c: 5.4% improved      Relevant Orders   POCT glycosylated hemoglobin (Hb A1C) (Completed)   Return in about 6 months (around 06/09/2023) for CPE (fasting).     ChaWilfred LacyP

## 2022-12-07 NOTE — Assessment & Plan Note (Signed)
Stable renal function Repeat labs in 3-54month

## 2022-12-28 ENCOUNTER — Ambulatory Visit: Payer: Commercial Managed Care - PPO | Admitting: Obstetrics and Gynecology

## 2022-12-28 ENCOUNTER — Encounter: Payer: Self-pay | Admitting: Obstetrics and Gynecology

## 2022-12-28 ENCOUNTER — Encounter: Payer: Self-pay | Admitting: Hematology and Oncology

## 2022-12-28 VITALS — BP 120/72 | HR 83 | Wt 221.0 lb

## 2022-12-28 DIAGNOSIS — B3731 Acute candidiasis of vulva and vagina: Secondary | ICD-10-CM

## 2022-12-28 DIAGNOSIS — N760 Acute vaginitis: Secondary | ICD-10-CM

## 2022-12-28 LAB — WET PREP FOR TRICH, YEAST, CLUE

## 2022-12-28 MED ORDER — FLUCONAZOLE 150 MG PO TABS: 150.0000 mg | ORAL_TABLET | Freq: Once | ORAL | 0 refills | Status: AC

## 2022-12-28 MED ORDER — HYDROXYZINE HCL 10 MG PO TABS: 10.0000 mg | ORAL_TABLET | Freq: Three times a day (TID) | ORAL | 0 refills | Status: DC | PRN

## 2022-12-28 MED ORDER — CLOBETASOL PROPIONATE 0.05 % EX OINT: 1.0000 | TOPICAL_OINTMENT | Freq: Two times a day (BID) | CUTANEOUS | 0 refills | Status: DC

## 2022-12-28 NOTE — Progress Notes (Signed)
GYNECOLOGY  VISIT   HPI: 32 y.o.   Single Black or African American Not Hispanic or Latino  female   [phone] with Patient's last menstrual period was 12/18/2022 (approximate).   here for patient states that she is having some vaginal itching that seems to be persistent since Jan. She states that she has not had sexually active since January. She was treated for BV and yeast in January. Earlier this month she was treated with clindamycin cream (not examined).  Negative GC/CT/Trich in 1/24, not sexually since 1/24.   She c/o a green, clumpy vaginal d/c. Not as bad as it was, now yellow. Itching is bad.   GYNECOLOGIC HISTORY: Patient's last menstrual period was 12/18/2022 (approximate). Contraception:not sexually active  Menopausal hormone therapy: none         OB History     Gravida  1   Para  1   Term  0   Preterm  1   AB  0   Living  1      SAB  0   IAB  0   Ectopic  0   Multiple  0   Live Births  1              Patient Active Problem List   Diagnosis Date Noted   Acute vaginitis 12/07/2022   Iron deficiency anemia due to chronic blood loss 10/16/2022   Normochromic normocytic anemia 09/01/2022   Prediabetes 09/01/2022   Infected pilonidal cyst 08/15/2022   Family history of high blood pressure 11/24/2021   Primary hypertension 11/24/2021   CKD (chronic kidney disease) stage 3, GFR 30-59 ml/min (HCC) 11/24/2021   GAD (generalized anxiety disorder) 11/24/2021   Acanthosis nigricans 01/16/2012   LVH (left ventricular hypertrophy) 01/16/2012    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   states she had "thickened muscle around her heart"   Hypertension    was taking amlodipine stopped about 1 yr ago - no insurance - had been on meds since age 74   Pregnancy induced hypertension     Past Surgical History:  Procedure Laterality Date   CESAREAN SECTION N/A 05/04/2016   Procedure: CESAREAN SECTION;  Surgeon: Jonnie Kind, MD;  Location: [place];  Service: Obstetrics;  Laterality: N/A;   LUMBAR PUNCTURE  07/20/2014   fluid build up around spinal column   NO PAST SURGERIES      Current Outpatient Medications  Medication Sig Dispense Refill   amLODipine (NORVASC) 10 MG tablet Take 1 tablet (10 mg total) by mouth daily. 90 tablet 3   No current facility-administered medications for this visit.   Facility-Administered Medications Ordered in Other Visits  Medication Dose Route Frequency Provider Last Rate Last Admin   acetaminophen (TYLENOL) tablet 650 mg  650 mg Oral Once Tresa Res, RPH       diphenhydrAMINE (BENADRYL) capsule 25 mg  25 mg Oral Once Tresa Res, [facility]       iron sucrose (VENOFER) 200 mg in sodium chloride 0.9 % 100 mL IVPB  200 mg Intravenous Once Tresa Res, [facility]         ALLERGIES: Patient has no known allergies.  Family History  Problem Relation Age of Onset   Diabetes Maternal Grandmother    Diabetes Paternal Grandmother    Thyroid disease Mother     Social History   Socioeconomic History   Marital status: Single    Spouse name: Not on file  Number of children: 1   Years of education: Not on file   Highest education level: Not on file  Occupational History    Employer: [employer]    Comment: [employer]  Tobacco Use   Smoking status: Never   Smokeless tobacco: Never  Vaping Use   Vaping Use: Never used  Substance and Sexual Activity   Alcohol use: No    Comment: Rarely (less than once per month)   Drug use: Not Currently    Types: Marijuana   Sexual activity: Not Currently  Other Topics Concern   Not on file  Social History Narrative   Not on file   Social Determinants of Health   Financial Resource Strain: Not on file  Food Insecurity: Not on file  Transportation Needs: Not on file  Physical Activity: Not on file  Stress: Not on file  Social Connections: Not on file  Intimate Partner Violence: Not on file    Review of  Systems  All other systems reviewed and are negative.   PHYSICAL EXAMINATION:    BP 120/72   Pulse 83   Wt 221 lb (100.2 kg)   LMP 12/18/2022 (Approximate)   SpO2 100%   BMI 33.60 kg/m     General appearance: alert, cooperative and appears stated age  Pelvic: External genitalia:  no lesions, diffuse whitening and irritation              Urethra:  normal appearing urethra with no masses, tenderness or lesions              Bartholins and Skenes: normal                 Vagina: erythematous appearing vagina with very thick, clumpy, yellow vaginal d/c              Cervix: No lesions                Chaperone, Gae Dry, CMA was present for exam.  1. Acute vaginitis - WET PREP FOR TRICH, YEAST, CLUE - clobetasol ointment (TEMOVATE) 0.05 %; Apply 1 Application topically 2 (two) times daily. Apply a pea sized amount topically BID for up to 2 weeks as needed  Dispense: 60 g; Refill: 0 - hydrOXYzine (ATARAX) 10 MG tablet; Take 1 tablet (10 mg total) by mouth 3 (three) times daily as needed.  Dispense: 10 tablet; Refill: 0  2. Yeast vaginitis Severe - fluconazole (DIFLUCAN) 150 MG tablet; Take 1 tablet (150 mg total) by mouth once for 1 dose. Take one tablet po q 72 hours x 3 doses.  Dispense: 3 tablet; Refill: 0

## 2022-12-28 NOTE — Patient Instructions (Signed)

## 2023-01-07 ENCOUNTER — Other Ambulatory Visit: Payer: Self-pay | Admitting: Hematology and Oncology

## 2023-01-07 DIAGNOSIS — D500 Iron deficiency anemia secondary to blood loss (chronic): Secondary | ICD-10-CM

## 2023-01-08 ENCOUNTER — Inpatient Hospital Stay: Payer: Commercial Managed Care - PPO | Attending: Hematology and Oncology

## 2023-01-08 ENCOUNTER — Inpatient Hospital Stay (HOSPITAL_BASED_OUTPATIENT_CLINIC_OR_DEPARTMENT_OTHER): Payer: Commercial Managed Care - PPO | Admitting: Hematology and Oncology

## 2023-01-08 VITALS — BP 134/91 | HR 65 | Temp 98.2°F | Resp 13 | Wt 222.5 lb

## 2023-01-08 DIAGNOSIS — D75839 Thrombocytosis, unspecified: Secondary | ICD-10-CM | POA: Insufficient documentation

## 2023-01-08 DIAGNOSIS — D649 Anemia, unspecified: Secondary | ICD-10-CM | POA: Diagnosis not present

## 2023-01-08 DIAGNOSIS — D500 Iron deficiency anemia secondary to blood loss (chronic): Secondary | ICD-10-CM

## 2023-01-08 DIAGNOSIS — D509 Iron deficiency anemia, unspecified: Secondary | ICD-10-CM | POA: Insufficient documentation

## 2023-01-08 LAB — RETIC PANEL
Immature Retic Fract: 9.6 % (ref 2.3–15.9)
RBC.: 3.5 MIL/uL — ABNORMAL LOW (ref 3.87–5.11)
Retic Count, Absolute: 50.4 10*3/uL (ref 19.0–186.0)
Retic Ct Pct: 1.4 % (ref 0.4–3.1)
Reticulocyte Hemoglobin: 36.7 pg (ref >27.9)

## 2023-01-08 LAB — CBC WITH DIFFERENTIAL (CANCER CENTER ONLY)
Abs Immature Granulocytes: 0.02 10*3/uL (ref 0.00–0.07)
Basophils Absolute: 0 10*3/uL (ref 0.0–0.1)
Basophils Relative: 0 %
Eosinophils Absolute: 0.1 10*3/uL (ref 0.0–0.5)
Eosinophils Relative: 1 %
HCT: 33.9 % — ABNORMAL LOW (ref 36.0–46.0)
Hemoglobin: 11.5 g/dL — ABNORMAL LOW (ref 12.0–15.0)
Immature Granulocytes: 0 %
Lymphocytes Relative: 43 %
Lymphs Abs: 3 10*3/uL (ref 0.7–4.0)
MCH: 32.7 pg (ref 26.0–34.0)
MCHC: 33.9 g/dL (ref 30.0–36.0)
MCV: 96.3 fL (ref 80.0–100.0)
Monocytes Absolute: 0.3 10*3/uL (ref 0.1–1.0)
Monocytes Relative: 5 %
Neutro Abs: 3.5 10*3/uL (ref 1.7–7.7)
Neutrophils Relative %: 51 %
Platelet Count: 299 10*3/uL (ref 150–400)
RBC: 3.52 MIL/uL — ABNORMAL LOW (ref 3.87–5.11)
RDW: 13.5 % (ref 11.5–15.5)
WBC Count: 7 10*3/uL (ref 4.0–10.5)
nRBC: 0 % (ref 0.0–0.2)

## 2023-01-08 LAB — CMP (CANCER CENTER ONLY)
ALT: 20 U/L (ref 0–44)
AST: 18 U/L (ref 15–41)
Albumin: 4.1 g/dL (ref 3.5–5.0)
Alkaline Phosphatase: 71 U/L (ref 38–126)
Anion gap: 8 (ref 5–15)
BUN: 11 mg/dL (ref 6–20)
CO2: 21 mmol/L — ABNORMAL LOW (ref 22–32)
Calcium: 9.2 mg/dL (ref 8.9–10.3)
Chloride: 107 mmol/L (ref 98–111)
Creatinine: 1.28 mg/dL — ABNORMAL HIGH (ref 0.44–1.00)
GFR, Estimated: 58 mL/min — ABNORMAL LOW (ref >60)
Glucose, Bld: 82 mg/dL (ref 70–99)
Potassium: 4.5 mmol/L (ref 3.5–5.1)
Sodium: 136 mmol/L (ref 135–145)
Total Bilirubin: 0.3 mg/dL (ref 0.3–1.2)
Total Protein: 7.4 g/dL (ref 6.5–8.1)

## 2023-01-08 LAB — IRON AND IRON BINDING CAPACITY (CC-WL,HP ONLY)
Iron: 82 ug/dL (ref 28–170)
Saturation Ratios: 28 % (ref 10.4–31.8)
TIBC: 297 ug/dL (ref 250–450)
UIBC: 215 ug/dL (ref 148–442)

## 2023-01-08 LAB — FERRITIN: Ferritin: 144 ng/mL (ref 11–307)

## 2023-01-08 NOTE — Progress Notes (Signed)
[facility] [phone]   [phone]  PROGRESS NOTE  Patient Care Team: Nche, Bonna Gains, NP as PCP - General (Internal Medicine)  Hematological/Oncological History # Normocytic Anemia # Thrombocytosis 08/22/2022: WBC 6.8, Hgb 11.8, MCV 94, Plt 539 10/09/2022: establish care with Dr. Leonides Schanz  1/23-12/04/2022: IV iron sucrose 200 mg x 5 doses.   Interval History:  Patty Gilmore 32 y.o. female with medical history significant for iron deficiency anemia who presents for a follow up visit. The patient's last visit was on 10/09/2022. In the interim since the last visit she has received IV iron sucrose 200 mg x 5 doses.   On exam today Patty Gilmore reports that she tolerated the iron infusions well without any major side effects.  She notes that she did not get much of an improvement as a result of the iron infusions but it was "noticeable".  She notes that she is been doing her best to try to eat more iron rich foods including red meat and vegetables.  Her menstrual cycles are still "about the same".  She reports the bleeding has been quite light though she has been having difficulties with recurrent yeast infections and possible BV.  She does that she has gone through multiple courses of treatment but still having symptoms.  She will be talking to her doctor further about this.  She notes that otherwise she has not been having any trouble with fevers, chills, sweats, nausea, vomiting or diarrhea.  She is not having any lightheadedness, dizziness, or shortness of breath.  A full 10 point ROS is otherwise negative.  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   states she had "thickened muscle around her heart"   Hypertension    was taking amlodipine stopped about 1 yr ago - no insurance - had been on meds since age 53   Pregnancy induced hypertension     SURGICAL HISTORY: Past Surgical History:  Procedure  Laterality Date   CESAREAN SECTION N/A 05/04/2016   Procedure: CESAREAN SECTION;  Surgeon: Tilda Burrow, MD;  Location: [facility];  Service: Obstetrics;  Laterality: N/A;   LUMBAR PUNCTURE  07/20/2014   fluid build up around spinal column   NO PAST SURGERIES      SOCIAL HISTORY: Social History   Socioeconomic History   Marital status: Single    Spouse name: Not on file   Number of children: 1   Years of education: Not on file   Highest education level: Not on file  Occupational History    Employer: [employer]    Comment: [employer]  Tobacco Use   Smoking status: Never   Smokeless tobacco: Never  Vaping Use   Vaping Use: Never used  Substance and Sexual Activity   Alcohol use: No    Comment: Rarely (less than once per month)   Drug use: Not Currently    Types: Marijuana   Sexual activity: Not Currently  Other Topics Concern   Not on file  Social History Narrative   Not on file   Social Determinants of Health   Financial Resource Strain: Not on file  Food Insecurity: Not on file  Transportation Needs: Not on file  Physical Activity: Not on file  Stress: Not on file  Social Connections: Not on file  Intimate Partner Violence: Not on file    FAMILY HISTORY: Family History  Problem Relation Age of Onset   Diabetes  Maternal Grandmother    Diabetes Paternal Grandmother    Thyroid disease Mother     ALLERGIES:  has No Known Allergies.  MEDICATIONS:  Current Outpatient Medications  Medication Sig Dispense Refill   amLODipine (NORVASC) 10 MG tablet Take 1 tablet (10 mg total) by mouth daily. 90 tablet 3   clobetasol ointment (TEMOVATE) 0.05 % Apply 1 Application topically 2 (two) times daily. Apply a pea sized amount topically BID for up to 2 weeks as needed 60 g 0   hydrOXYzine (ATARAX) 10 MG tablet Take 1 tablet (10 mg total) by mouth 3 (three) times daily as needed. 10 tablet 0   No current facility-administered medications for this visit.    Facility-Administered Medications Ordered in Other Visits  Medication Dose Route Frequency Provider Last Rate Last Admin   acetaminophen (TYLENOL) tablet 650 mg  650 mg Oral Once Desma McgregorPatel, Yatin, RPH       diphenhydrAMINE (BENADRYL) capsule 25 mg  25 mg Oral Once Desma McgregorPatel, Yatin, [facility]       iron sucrose (VENOFER) 200 mg in sodium chloride 0.9 % 100 mL IVPB  200 mg Intravenous Once Desma McgregorPatel, Yatin, [facility]        REVIEW OF SYSTEMS:   Constitutional: ( - ) fevers, ( - )  chills , ( - ) night sweats Eyes: ( - ) blurriness of vision, ( - ) double vision, ( - ) watery eyes Ears, nose, mouth, throat, and face: ( - ) mucositis, ( - ) sore throat Respiratory: ( - ) cough, ( - ) dyspnea, ( - ) wheezes Cardiovascular: ( - ) palpitation, ( - ) chest discomfort, ( - ) lower extremity swelling Gastrointestinal:  ( - ) nausea, ( - ) heartburn, ( - ) change in bowel habits Skin: ( - ) abnormal skin rashes Lymphatics: ( - ) new lymphadenopathy, ( - ) easy bruising Neurological: ( - ) numbness, ( - ) tingling, ( - ) new weaknesses Behavioral/Psych: ( - ) mood change, ( - ) new changes  All other systems were reviewed with the patient and are negative.  PHYSICAL EXAMINATION:  Vitals:   01/08/23 1145  BP: (!) 134/91  Pulse: 65  Resp: 13  Temp: 98.2 F (36.8 C)  SpO2: 100%   Filed Weights   01/08/23 1145  Weight: 222 lb 8 oz (100.9 kg)    GENERAL: Well-appearing middle-aged African-American female, alert, no distress and comfortable SKIN: skin color, texture, turgor are normal, no rashes or significant lesions EYES: conjunctiva are pink and non-injected, sclera clear LUNGS: clear to auscultation and percussion with normal breathing effort HEART: regular rate & rhythm and no murmurs and no lower extremity edema Musculoskeletal: no cyanosis of digits and no clubbing  PSYCH: alert & oriented x 3, fluent speech NEURO: no focal motor/sensory deficits  LABORATORY DATA:  I have reviewed the data as  listed    Latest Ref Rng & Units 01/08/2023   10:55 AM 10/09/2022   10:17 AM 08/22/2022    1:34 PM  CBC  WBC 4.0 - 10.5 K/uL 7.0  5.5  6.8   Hemoglobin 12.0 - 15.0 g/dL 16.111.5  09.610.8  04.511.8   Hematocrit 36.0 - 46.0 % 33.9  32.0  34.6   Platelets 150 - 400 K/uL 299  306  539.0 Repeated and verified X2.        Latest Ref Rng & Units 01/08/2023   10:55 AM 10/09/2022   10:17 AM 08/22/2022    1:34 PM  CMP  Glucose 70 - 99 mg/dL 82  82  82   BUN 6 - 20 mg/dL 11  10  19    Creatinine 0.44 - 1.00 mg/dL 9.78  4.78  4.12   Sodium 135 - 145 mmol/L 136  136  134   Potassium 3.5 - 5.1 mmol/L 4.5  4.0  4.2   Chloride 98 - 111 mmol/L 107  105  100   CO2 22 - 32 mmol/L 21  23  27    Calcium 8.9 - 10.3 mg/dL 9.2  9.8  9.8   Total Protein 6.5 - 8.1 g/dL 7.4  7.5  8.7   Total Bilirubin 0.3 - 1.2 mg/dL 0.3  0.4  0.3   Alkaline Phos 38 - 126 U/L 71  71  72   AST 15 - 41 U/L 18  25  32   ALT 0 - 44 U/L 20  21  38     RADIOGRAPHIC STUDIES: No results found.  ASSESSMENT & PLAN Patty Gilmore Expose 32 y.o. female with medical history significant for iron deficiency anemia who presents for a follow up visit.  #Thrombocytosis, likely 2/2 to Iron Deficiency-resolved # Mild Normocytic Anemia -stable # Iron Deficiency Anemia -- Patient received 5 doses of IV iron sucrose 200 mg from January to February. --Modest improvement in symptoms --Labs today show white blood cell count 7.0, hemoglobin 11.5, platelet count 299, and MCV 96.3. --Iron studies today show iron saturation of 28% with ferritin of 144.  No need for IV iron treatment at this time. --Recommend continuation with iron rich foods at this time. --RTC in 6 months for repeat evaluation to assure her hemoglobin and iron levels are stable.  No orders of the defined types were placed in this encounter.   All questions were answered. The patient knows to call the clinic with any problems, questions or concerns.  A total of more than 30 minutes were spent  on this encounter with face-to-face time and non-face-to-face time, including preparing to see the patient, ordering tests and/or medications, counseling the patient and coordination of care as outlined above.   Ulysees Barns, MD Department of Hematology/Oncology [facility] Phone: [phone] Pager: [phone] Email: [email or web address]  01/08/2023 4:49 PM

## 2023-01-09 ENCOUNTER — Ambulatory Visit: Payer: Commercial Managed Care - PPO | Admitting: Radiology

## 2023-01-09 VITALS — BP 136/86

## 2023-01-09 DIAGNOSIS — N760 Acute vaginitis: Secondary | ICD-10-CM | POA: Diagnosis not present

## 2023-01-09 LAB — WET PREP FOR TRICH, YEAST, CLUE

## 2023-01-09 MED ORDER — XACIATO 2 % VA GEL
1.0000 | Freq: Once | VAGINAL | 0 refills | Status: AC
Start: 2023-01-09 — End: 2023-01-09

## 2023-01-09 MED ORDER — TERCONAZOLE 0.4 % VA CREA
1.0000 | TOPICAL_CREAM | Freq: Every day | VAGINAL | 0 refills | Status: DC
Start: 2023-01-09 — End: 2023-04-03

## 2023-01-09 NOTE — Progress Notes (Signed)
      Subjective: Patty Gilmore is a 32 y.o. female who complains of recurrent vaginal itching, yellow/green discharge. Was treated on 12/28/22 with fluconazole 150mg  x 3 with no relief. Has been struggling with vaginitis since January. Has been treated for trich x 2, BV and yeast multiple times. Clobetasol has helped with some of the vulvar itching. Has not been sexually active since 1/24.   Review of Systems  All other systems reviewed and are negative.   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   -General: no acute distress -Vulva: without lesions or discharge -Vagina: thick clumpy green discharge present, aptima swab and wet prep obtained -Cervix: no lesion or discharge, no CMT -Perineum: no lesions -Uterus: Mobile, non tender -Adnexa: no masses or tenderness   Microscopic wet-mount exam shows clue cells, hyphae.   Raynelle Fanning, CMA present for exam  Assessment:/Plan:   1. Recurrent vaginitis Xaciato rx sent, use at bedtime then wait 3 days before starting terazol for yeast Baking soda bath before starting to help with itching - SureSwab Advanced Vaginitis Plus,TMA - Mycoplasma / ureaplasma culture - WET PREP FOR TRICH, YEAST, CLUE    Will contact patient with results of testing completed today. Avoid intercourse until symptoms are resolved. Safe sex encouraged. Avoid the use of soaps or perfumed products in the peri area. Avoid tub baths and sitting in sweaty or wet clothing for prolonged periods of time.

## 2023-01-10 LAB — SURESWAB® ADVANCED VAGINITIS PLUS,TMA
C. trachomatis RNA, TMA: NOT DETECTED
CANDIDA SPECIES: DETECTED — AB
Candida glabrata: NOT DETECTED
N. gonorrhoeae RNA, TMA: NOT DETECTED
TRICHOMONAS VAGINALIS (TV),TMA: NOT DETECTED

## 2023-01-16 LAB — MYCOPLASMA / UREAPLASMA CULTURE

## 2023-01-18 ENCOUNTER — Ambulatory Visit: Payer: Commercial Managed Care - PPO | Admitting: Dietician

## 2023-02-13 ENCOUNTER — Ambulatory Visit: Payer: Commercial Managed Care - PPO | Admitting: Nurse Practitioner

## 2023-02-20 ENCOUNTER — Ambulatory Visit: Payer: Commercial Managed Care - PPO | Admitting: Nurse Practitioner

## 2023-02-21 ENCOUNTER — Ambulatory Visit: Payer: Commercial Managed Care - PPO | Admitting: Nurse Practitioner

## 2023-03-04 ENCOUNTER — Other Ambulatory Visit: Payer: Self-pay | Admitting: Nurse Practitioner

## 2023-04-03 ENCOUNTER — Other Ambulatory Visit (HOSPITAL_COMMUNITY)
Admission: RE | Admit: 2023-04-03 | Discharge: 2023-04-03 | Disposition: A | Discharge status: Home / Self Care | Payer: Commercial Managed Care - PPO | Source: Ambulatory Visit | Attending: Nurse Practitioner | Admitting: Nurse Practitioner

## 2023-04-03 ENCOUNTER — Ambulatory Visit (INDEPENDENT_AMBULATORY_CARE_PROVIDER_SITE_OTHER): Payer: Commercial Managed Care - PPO | Admitting: Nurse Practitioner

## 2023-04-03 ENCOUNTER — Encounter: Payer: Self-pay | Admitting: Nurse Practitioner

## 2023-04-03 VITALS — BP 128/74 | HR 64 | Ht 63.0 in | Wt 226.0 lb

## 2023-04-03 DIAGNOSIS — Z124 Encounter for screening for malignant neoplasm of cervix: Secondary | ICD-10-CM

## 2023-04-03 DIAGNOSIS — Z113 Encounter for screening for infections with a predominantly sexual mode of transmission: Secondary | ICD-10-CM | POA: Diagnosis not present

## 2023-04-03 DIAGNOSIS — N760 Acute vaginitis: Secondary | ICD-10-CM

## 2023-04-03 DIAGNOSIS — Z01419 Encounter for gynecological examination (general) (routine) without abnormal findings: Secondary | ICD-10-CM | POA: Diagnosis not present

## 2023-04-03 DIAGNOSIS — N898 Other specified noninflammatory disorders of vagina: Secondary | ICD-10-CM

## 2023-04-03 LAB — WET PREP FOR TRICH, YEAST, CLUE

## 2023-04-03 MED ORDER — METRONIDAZOLE 0.75 % VA GEL
1.0000 | Freq: Every day | VAGINAL | 0 refills | Status: AC
Start: 2023-04-03 — End: 2023-04-08

## 2023-04-03 NOTE — Progress Notes (Signed)
Patty Gilmore 1991/04/14 [id number]   History:  32 y.o. G1P0101 presents for annual exam. Monthly cycles. Complains of intermittent vaginal odor without itching or discharge. H/O HTN, CKD3, prediabetes. C/O generalized swelling before menses, resolves completely afterwards. Swelling is worst in lower extremities. Amlodipine recently increased and hydrochlorothiazide discontinued by PCP.   Gynecologic History Patient's last menstrual period was 04/03/2023. Period Cycle (Days): 28 Period Duration (Days): 3 Period Pattern: Regular Menstrual Flow: Moderate Menstrual Control: Maxi pad Menstrual Control Change Freq (Hours): 4 Dysmenorrhea: (!) Moderate Dysmenorrhea Symptoms: Cramping, Headache, Nausea Contraception/Family planning: coitus interruptus and condoms Sexually active: Yes  Health Maintenance Last Pap: 10/14/2020. Results were: ASCUS neg HPV Last mammogram: Not indicated Last colonoscopy: Not indicated Last Dexa: Not indicated:   Past medical history, past surgical history, family history and social history were all reviewed and documented in the EPIC chart. Works at [employer]. 31 yo daughter.   ROS:  A ROS was performed and pertinent positives and negatives are included.  Exam:  Vitals:   04/03/23 1346  BP: 128/74  Pulse: 64  SpO2: 100%  Weight: 226 lb (102.5 kg)  Height: 5\' 3"  (1.6 m)   Body mass index is 40.03 kg/m.  General appearance:  Normal Thyroid:  Symmetrical, normal in size, without palpable masses or nodularity. Respiratory  Auscultation:  Clear without wheezing or rhonchi Cardiovascular  Auscultation:  Regular rate, without rubs, murmurs or gallops  Edema/varicosities:  Not grossly evident Abdominal  Soft,nontender, without masses, guarding or rebound.  Liver/spleen:  No organomegaly noted  Hernia:  None appreciated  Skin  Inspection:  Grossly normal Breasts: Examined lying and sitting.   Right: Without masses, retractions, nipple discharge  or axillary adenopathy.   Left: Without masses, retractions, nipple discharge or axillary adenopathy. Genitourinary   Inguinal/mons:  Normal without inguinal adenopathy  External genitalia:  Normal appearing vulva with no masses, tenderness, or lesions  BUS/Urethra/Skene's glands:  Normal  Vagina:  Normal appearing with normal color and discharge, no lesions  Cervix:  Normal appearing without discharge or lesions  Uterus:  Normal in size, shape and contour.  Midline and mobile, nontender  Adnexa/parametria:     Rt: Normal in size, without masses or tenderness.   Lt: Normal in size, without masses or tenderness.  Anus and perineum: Normal  Digital rectal exam: Deferred  Patient informed chaperone available to be present for breast and pelvic exam. Patient has requested no chaperone to be present. Patient has been advised what will be completed during breast and pelvic exam.   Wet prep + clue cells (+ odor)  Assessment/Plan:  32 y.o. G1P0101 for annual exam.   Well female exam with routine gynecological exam - Education provided on SBEs, importance of preventative screenings, current guidelines, high calcium diet, regular exercise, and multivitamin daily. Labs with PCP.   Screening for cervical cancer - Plan: Cytology - PAP( [facility]). 2022 ASCUS neg HPV.   Screening examination for STD (sexually transmitted disease) - Plan: Cytology - PAP( [place]). GC/CT/Trich added to pap.   Vaginal odor - Plan: WET PREP FOR TRICH, YEAST, CLUE. Positive for clue cells.   Bacterial vaginosis - Plan: metroNIDAZOLE (METROGEL) 0.75 % vaginal gel nightly x 5 nights.   Generalized swelling - feels it occurs prior to menses then fully resolves after menses start. Worst in lower extremities. Discussed that swelling could be related to increase in Amlodipine and discontinuation of hydrochlorothiazide. Will discuss with PCP.   Return in 1 year for annual  or sooner if needed.     Olivia Mackie  DNP, 2:29 PM 04/03/2023

## 2023-04-06 LAB — CYTOLOGY - PAP
Chlamydia: NEGATIVE
Comment: NEGATIVE
Comment: NEGATIVE
Comment: NEGATIVE
Comment: NORMAL
Diagnosis: UNDETERMINED — AB
High risk HPV: NEGATIVE
Neisseria Gonorrhea: NEGATIVE
Trichomonas: NEGATIVE

## 2023-06-12 ENCOUNTER — Emergency Department (HOSPITAL_COMMUNITY)
Admission: EM | Admit: 2023-06-12 | Discharge: 2023-06-12 | Disposition: A | Discharge status: Home / Self Care | Payer: Commercial Managed Care - PPO | Attending: Emergency Medicine | Admitting: Emergency Medicine

## 2023-06-12 ENCOUNTER — Ambulatory Visit
Admission: EM | Admit: 2023-06-12 | Discharge: 2023-06-12 | Disposition: A | Discharge status: Other Acute Inpatient Hospital | Payer: Commercial Managed Care - PPO

## 2023-06-12 ENCOUNTER — Encounter: Payer: Self-pay | Admitting: Nurse Practitioner

## 2023-06-12 ENCOUNTER — Other Ambulatory Visit: Payer: Self-pay

## 2023-06-12 DIAGNOSIS — L0591 Pilonidal cyst without abscess: Secondary | ICD-10-CM

## 2023-06-12 DIAGNOSIS — L0501 Pilonidal cyst with abscess: Secondary | ICD-10-CM | POA: Insufficient documentation

## 2023-06-12 DIAGNOSIS — R000 Tachycardia, unspecified: Secondary | ICD-10-CM

## 2023-06-12 MED ORDER — LIDOCAINE HCL (PF) 1 % IJ SOLN
15.0000 mL | Freq: Once | INTRAMUSCULAR | Status: DC
  Filled 2023-06-12: qty 30

## 2023-06-12 NOTE — ED Triage Notes (Signed)
Pilonidal cyst x 3 days, no drainage.

## 2023-06-12 NOTE — ED Provider Notes (Signed)
EMERGENCY DEPARTMENT AT [facility] Provider Note   CSN: [id number] Arrival date & time: 06/12/23  1735     History  Chief Complaint  Patient presents with   Abscess    Patty Gilmore is a 32 y.o. female who presents with concern for a painful abscess along her upper buttock.  States she has had these before and it keeps recurring.  Was supposed to see general surgery, but has not yet scheduled an appointment.  Her PCP was going to coordinate this for her.  Denies any fever or chills.  Denies any drainage from the area.   Abscess      Home Medications Prior to Admission medications   Medication Sig Start Date End Date Taking? Authorizing Provider  amLODipine (NORVASC) 10 MG tablet Take 1 tablet (10 mg total) by mouth daily. 09/01/22   Nche, Bonna Gains, NP      Allergies    Patient has no known allergies.    Review of Systems   Review of Systems  Skin:        Abscess     Physical Exam Updated Vital Signs BP (!) 139/96 (BP Location: Left Arm)   Pulse 93   Temp 98.6 F (37 C) (Oral)   Resp 18   Ht 5\' 3"  (1.6 m)   Wt 97 kg   LMP 05/03/2023 (Approximate)   SpO2 100%   BMI 37.88 kg/m  Physical Exam Vitals and nursing note reviewed.  Constitutional:      Appearance: Normal appearance.  HENT:     Head: Atraumatic.  Cardiovascular:     Rate and Rhythm: Normal rate and regular rhythm.  Pulmonary:     Effort: Pulmonary effort is normal.  Skin:    Comments: Raised approximately 3cm fluctuant area of the upper right buttock near the gluteal cleft.  No surrounding erythema or edema..  Tender to palpation, no obvious drainage  Neurological:     General: No focal deficit present.     Mental Status: She is alert.  Psychiatric:        Mood and Affect: Mood normal.        Behavior: Behavior normal.     ED Results / Procedures / Treatments   Labs (all labs ordered are listed, but only abnormal results are displayed) Labs Reviewed - No  data to display  EKG None  Radiology No results found.  Procedures .Marland KitchenIncision and Drainage  Date/Time: 06/12/2023 7:51 PM  Performed by: Arabella Merles, PA-C Authorized by: Arabella Merles, PA-C   Consent:    Consent obtained:  Verbal   Consent given by:  Patient   Risks, benefits, and alternatives were discussed: yes     Risks discussed:  Bleeding, incomplete drainage, infection and pain   Alternatives discussed:  No treatment Universal protocol:    Procedure explained and questions answered to patient or proxy's satisfaction: yes     Patient identity confirmed:  Verbally with patient Location:    Type:  Abscess   Size:  3cm   Location:  Anogenital   Anogenital location:  Pilonidal Pre-procedure details:    Skin preparation:  Povidone-iodine Sedation:    Sedation type:  None Anesthesia:    Anesthesia method:  Local infiltration   Local anesthetic:  Lidocaine 1% w/o epi (7ml) Procedure type:    Complexity:  Simple Procedure details:    Incision types:  Stab incision   Incision depth:  Subcutaneous   Wound management:  Irrigated with saline  Drainage:  Purulent   Drainage amount:  Moderate   Wound treatment:  Wound left open   Packing materials:  None Post-procedure details:    Procedure completion:  Tolerated well, no immediate complications     Medications Ordered in ED Medications  lidocaine (PF) (XYLOCAINE) 1 % injection 15 mL (has no administration in time range)    ED Course/ Medical Decision Making/ A&P                                 Medical Decision Making Risk Prescription drug management.   32 y.o. 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  States that has been there for the past couple days and has been very painful.  Reports these have been recurrent in the same spot for couple  months now.  Was supposed to see general surgery but has not yet been able to get into them.  Upon examination with chaperone present, pilonidal abscess approximately 3 cm in size.  Area is raised and fluctuant.  No surrounding erythema, vital signs within normal limits, low suspicion for systemic infection.  Incision and drainage was performed with patient consent, and moderate amount of purulent drainage was expelled.  Area was covered lightly with gauze.  I discussed with the patient proper care for her incision and drainage.  I recommended she reach out to her PCP for the general surgery referral.  Contact information also provided for general surgery.   Impression: Pilonidal abscess  Disposition:  The patient was discharged home with instructions to follow-up with general surgery for definitive management and further evaluation.  Instructions given for care of her incision and drainage Return precautions given.    External records from outside source obtained and reviewed including urgent care visit from earlier today   Co morbidities that complicate the patient evaluation  Recurrent pilonidal abscesses             Final Clinical Impression(s) / ED Diagnoses Final diagnoses:  Pilonidal cyst with abscess    Rx / DC Orders ED Discharge Orders     None         Arabella Merles, PA-C 06/12/23 2000    Anders Simmonds T, DO 06/12/23 2219

## 2023-06-12 NOTE — ED Notes (Signed)
Patient is being discharged from the Urgent Care and sent to the Emergency Department via POV . Per RM, patient is in need of higher level of care due to abscess/sepsis??. Patient is aware and verbalizes understanding of plan of care.  Vitals:   06/12/23 1600 06/12/23 1604  BP: 122/63   Pulse: (!) 125 (!) 120  Resp: 18   Temp: 98.3 F (36.8 C)   SpO2: 98%

## 2023-06-12 NOTE — ED Provider Notes (Signed)
Patient here today for evaluation of pilonidal cyst that seems to be returning and worsening.  She states she is extremely uncomfortable at this time.  She has not had fever but has been taking Tylenol, unsure if there is underlying fever given significant tachycardia.  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  Recommended further evaluation in the emergency room.  Patient is agreeable to same.   Tomi Bamberger, PA-C 06/12/23 1734

## 2023-06-12 NOTE — ED Triage Notes (Signed)
"  I have a pilonidal cyst that is forming to the area again, not open yet, no draining". "Causing a lot of pain". No fever.

## 2023-06-12 NOTE — Discharge Instructions (Addendum)
Your abscess was drained today.  As discussed, reach out to your PCP as soon as possible so that they can coordinate a general surgery referral for you.  This will be necessary for definitive management (removing the casing) since this has been recurrent.  The contact information is also listed below.  Allow the area to drain.  Keep it clean with soap and water.  Do not soak in water until the incision is fully healed.  The incision will heal on its own.  You may use up to 800mg  ibuprofen every 8 hours as needed for pain.  Do not exceed 2.4g of ibuprofen per day.  Return to the ER should you develop fever, chills, pus drainage from your wound, redness around your wound.

## 2023-07-02 ENCOUNTER — Telehealth: Payer: Self-pay | Admitting: Hematology and Oncology

## 2023-07-09 ENCOUNTER — Ambulatory Visit: Payer: Commercial Managed Care - PPO | Admitting: Hematology and Oncology

## 2023-07-09 ENCOUNTER — Other Ambulatory Visit: Payer: Commercial Managed Care - PPO

## 2023-07-10 ENCOUNTER — Other Ambulatory Visit: Payer: Self-pay | Admitting: Hematology and Oncology

## 2023-07-10 ENCOUNTER — Inpatient Hospital Stay (HOSPITAL_BASED_OUTPATIENT_CLINIC_OR_DEPARTMENT_OTHER): Payer: Commercial Managed Care - PPO | Admitting: Hematology and Oncology

## 2023-07-10 ENCOUNTER — Inpatient Hospital Stay: Payer: Commercial Managed Care - PPO | Attending: Hematology and Oncology

## 2023-07-10 VITALS — BP 136/101 | HR 70 | Temp 97.8°F | Resp 18 | Wt 213.5 lb

## 2023-07-10 DIAGNOSIS — D649 Anemia, unspecified: Secondary | ICD-10-CM | POA: Diagnosis present

## 2023-07-10 DIAGNOSIS — D500 Iron deficiency anemia secondary to blood loss (chronic): Secondary | ICD-10-CM

## 2023-07-10 DIAGNOSIS — D75839 Thrombocytosis, unspecified: Secondary | ICD-10-CM | POA: Insufficient documentation

## 2023-07-10 LAB — CMP (CANCER CENTER ONLY)
ALT: 15 U/L (ref 0–44)
AST: 17 U/L (ref 15–41)
Albumin: 4.2 g/dL (ref 3.5–5.0)
Alkaline Phosphatase: 64 U/L (ref 38–126)
Anion gap: 6 (ref 5–15)
BUN: 12 mg/dL (ref 6–20)
CO2: 23 mmol/L (ref 22–32)
Calcium: 9.5 mg/dL (ref 8.9–10.3)
Chloride: 109 mmol/L (ref 98–111)
Creatinine: 1.3 mg/dL — ABNORMAL HIGH (ref 0.44–1.00)
GFR, Estimated: 56 mL/min — ABNORMAL LOW (ref >60)
Glucose, Bld: 91 mg/dL (ref 70–99)
Potassium: 4.4 mmol/L (ref 3.5–5.1)
Sodium: 138 mmol/L (ref 135–145)
Total Bilirubin: 0.5 mg/dL (ref 0.3–1.2)
Total Protein: 7.3 g/dL (ref 6.5–8.1)

## 2023-07-10 LAB — CBC WITH DIFFERENTIAL (CANCER CENTER ONLY)
Abs Immature Granulocytes: 0.01 10*3/uL (ref 0.00–0.07)
Basophils Absolute: 0 10*3/uL (ref 0.0–0.1)
Basophils Relative: 0 %
Eosinophils Absolute: 0.1 10*3/uL (ref 0.0–0.5)
Eosinophils Relative: 2 %
HCT: 33.6 % — ABNORMAL LOW (ref 36.0–46.0)
Hemoglobin: 11.4 g/dL — ABNORMAL LOW (ref 12.0–15.0)
Immature Granulocytes: 0 %
Lymphocytes Relative: 35 %
Lymphs Abs: 2.2 10*3/uL (ref 0.7–4.0)
MCH: 33.1 pg (ref 26.0–34.0)
MCHC: 33.9 g/dL (ref 30.0–36.0)
MCV: 97.7 fL (ref 80.0–100.0)
Monocytes Absolute: 0.3 10*3/uL (ref 0.1–1.0)
Monocytes Relative: 4 %
Neutro Abs: 3.6 10*3/uL (ref 1.7–7.7)
Neutrophils Relative %: 59 %
Platelet Count: 263 10*3/uL (ref 150–400)
RBC: 3.44 MIL/uL — ABNORMAL LOW (ref 3.87–5.11)
RDW: 12.7 % (ref 11.5–15.5)
WBC Count: 6.2 10*3/uL (ref 4.0–10.5)
nRBC: 0 % (ref 0.0–0.2)

## 2023-07-10 LAB — RETIC PANEL
Immature Retic Fract: 7.5 % (ref 2.3–15.9)
RBC.: 3.42 MIL/uL — ABNORMAL LOW (ref 3.87–5.11)
Retic Count, Absolute: 56.1 10*3/uL (ref 19.0–186.0)
Retic Ct Pct: 1.6 % (ref 0.4–3.1)
Reticulocyte Hemoglobin: 35.9 pg (ref >27.9)

## 2023-07-10 LAB — FERRITIN: Ferritin: 145 ng/mL (ref 11–307)

## 2023-07-10 LAB — IRON AND IRON BINDING CAPACITY (CC-WL,HP ONLY)
Iron: 47 ug/dL (ref 28–170)
Saturation Ratios: 15 % (ref 10.4–31.8)
TIBC: 311 ug/dL (ref 250–450)
UIBC: 264 ug/dL (ref 148–442)

## 2023-07-10 NOTE — Progress Notes (Unsigned)
[facility] [phone]   [phone]  PROGRESS NOTE  Patient Care Team: Nche, Bonna Gains, NP as PCP - General (Internal Medicine)  Hematological/Oncological History # Normocytic Anemia # Thrombocytosis 08/22/2022: WBC 6.8, Hgb 11.8, MCV 94, Plt 539 10/09/2022: establish care with Dr. Leonides Schanz  1/23-12/04/2022: IV iron sucrose 200 mg x 5 doses.   Interval History:  Patty Gilmore 32 y.o. female with medical history significant for iron deficiency anemia who presents for a follow up visit. The patient's last visit was on 10/09/2022. In the interim since the last visit she had an ER visit on 06/12/2023 due to a painful abscess on her buttock.  On exam today Mrs. Dick reports she has been feeling good in the interim since her last visit.  She reports her energy levels are good but she feels somewhat tired today because she works third shift.  She notes she ranks her energy is about a 7 out of 10.  She denies that she feels like work is draining.  She is not having any lightheadedness, dizziness, shortness of breath.  She is doing her best to try to eat iron rich foods, predominantly green leafy veggies.  She reports that she is not taking any iron pills as it upsets her stomach.  She reports her cycles have been good and she has not had any particularly heavy ones in the interim since her last visit.  She is having cramping but no heavy clots or bleeding.  She reports that she typically goes there about 3 pads a day and they are not completely soaked.  She notes that otherwise she has not been having any trouble with fevers, chills, sweats, nausea, vomiting or diarrhea.  She is not having any lightheadedness, dizziness, or shortness of breath.  A full 10 point ROS is otherwise negative.  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   states she had "thickened muscle around her heart"   Hypertension    was  taking amlodipine stopped about 1 yr ago - no insurance - had been on meds since age 4   Pregnancy induced hypertension     SURGICAL HISTORY: Past Surgical History:  Procedure Laterality Date   CESAREAN SECTION N/A 05/04/2016   Procedure: CESAREAN SECTION;  Surgeon: Tilda Burrow, MD;  Location: [facility] BIR[facility]ING SUITES;  Service: Obstetrics;  Laterality: N/A;   LUMBAR PUNCTURE  07/20/2014   fluid build up around spinal column   NO PAST SURGERIES      SOCIAL HISTORY: Social History   Socioeconomic History   Marital status: Single    Spouse name: Not on file   Number of children: 1   Years of education: Not on file   Highest education level: Not on file  Occupational History    Employer: [employer]    Comment: [employer]  Tobacco Use   Smoking status: Never   Smokeless tobacco: Never  Vaping Use   Vaping status: Never Used  Substance and Sexual Activity   Alcohol use: No    Comment: Rarely (less than once per month)   Drug use: Not Currently    Types: Marijuana   Sexual activity: Not Currently  Other Topics Concern   Not on file  Social History Narrative   Not on file   Social Determinants of Health   Financial Resource Strain: Not on file  Food Insecurity: Not on file  Transportation Needs: Not on file  Physical Activity: Not on file  Stress: Not on file  Social Connections: Not on file  Intimate Partner Violence: Not on file    FAMILY HISTORY: Family History  Problem Relation Age of Onset   Diabetes Maternal Grandmother    Diabetes Paternal Grandmother    Thyroid disease Mother     ALLERGIES:  has No Known Allergies.  MEDICATIONS:  Current Outpatient Medications  Medication Sig Dispense Refill   amLODipine (NORVASC) 10 MG tablet Take 1 tablet (10 mg total) by mouth daily. 90 tablet 3   No current facility-administered medications for this visit.   Facility-Administered Medications Ordered in Other Visits  Medication Dose Route Frequency  Provider Last Rate Last Admin   acetaminophen (TYLENOL) tablet 650 mg  650 mg Oral Once Desma Mcgregor, RPH       diphenhydrAMINE (BENADRYL) capsule 25 mg  25 mg Oral Once Desma Mcgregor, [facility]       iron sucrose (VENOFER) 200 mg in sodium chloride 0.9 % 100 mL IVPB  200 mg Intravenous Once Desma Mcgregor, [facility]        REVIEW OF SYSTEMS:   Constitutional: ( - ) fevers, ( - )  chills , ( - ) night sweats Eyes: ( - ) blurriness of vision, ( - ) double vision, ( - ) watery eyes Ears, nose, mouth, throat, and face: ( - ) mucositis, ( - ) sore throat Respiratory: ( - ) cough, ( - ) dyspnea, ( - ) wheezes Cardiovascular: ( - ) palpitation, ( - ) chest discomfort, ( - ) lower extremity swelling Gastrointestinal:  ( - ) nausea, ( - ) heartburn, ( - ) change in bowel habits Skin: ( - ) abnormal skin rashes Lymphatics: ( - ) new lymphadenopathy, ( - ) easy bruising Neurological: ( - ) numbness, ( - ) tingling, ( - ) new weaknesses Behavioral/Psych: ( - ) mood change, ( - ) new changes  All other systems were reviewed with the patient and are negative.  PHYSICAL EXAMINATION:  Vitals:   07/10/23 1128  BP: (!) 136/101  Pulse: 70  Resp: 18  Temp: 97.8 F (36.6 C)  SpO2: 100%    Filed Weights   07/10/23 1128  Weight: 213 lb 8 oz (96.8 kg)     GENERAL: Well-appearing middle-aged African-American female, alert, no distress and comfortable SKIN: skin color, texture, turgor are normal, no rashes or significant lesions EYES: conjunctiva are pink and non-injected, sclera clear LUNGS: clear to auscultation and percussion with normal breathing effort HEART: regular rate & rhythm and no murmurs and no lower extremity edema Musculoskeletal: no cyanosis of digits and no clubbing  PSYCH: alert & oriented x 3, fluent speech NEURO: no focal motor/sensory deficits  LABORATORY DATA:  I have reviewed the data as listed    Latest Ref Rng & Units 07/10/2023   11:11 AM 01/08/2023   10:55 AM 10/09/2022   10:17 AM   CBC  WBC 4.0 - 10.5 K/uL 6.2  7.0  5.5   Hemoglobin 12.0 - 15.0 g/dL 54.0  98.1  19.1   Hematocrit 36.0 - 46.0 % 33.6  33.9  32.0   Platelets 150 - 400 K/uL 263  299  306        Latest Ref Rng & Units 07/10/2023   11:11 AM 01/08/2023   10:55 AM 10/09/2022   10:17 AM  CMP  Glucose 70 - 99 mg/dL 91  82  82   BUN 6 - 20 mg/dL 12  11  10   Creatinine 0.44 - 1.00 mg/dL 7.82  9.56  2.13   Sodium 135 - 145 mmol/L 138  136  136   Potassium 3.5 - 5.1 mmol/L 4.4  4.5  4.0   Chloride 98 - 111 mmol/L 109  107  105   CO2 22 - 32 mmol/L 23  21  23    Calcium 8.9 - 10.3 mg/dL 9.5  9.2  9.8   Total Protein 6.5 - 8.1 g/dL 7.3  7.4  7.5   Total Bilirubin 0.3 - 1.2 mg/dL 0.5  0.3  0.4   Alkaline Phos 38 - 126 U/L 64  71  71   AST 15 - 41 U/L 17  18  25    ALT 0 - 44 U/L 15  20  21      RADIOGRAPHIC STUDIES: No results found.  ASSESSMENT & PLAN Patty Gilmore Expose 32 y.o. female with medical history significant for iron deficiency anemia who presents for a follow up visit.  #Thrombocytosis, likely 2/2 to Iron Deficiency-resolved # Mild Normocytic Anemia -stable # Iron Deficiency Anemia -- Patient received 5 doses of IV iron sucrose 200 mg from January to February. --Modest improvement in symptoms --Labs today show white blood cell count 6.2, hemoglobin 1.4, MCV 97.7, and platelets of 263 --Iron studies today show iron saturation of 15% with ferritin of 145 no need for IV iron treatment at this time. --Recommend continuation with iron rich foods at this time. --RTC in 6 months for repeat evaluation to assure her hemoglobin and iron levels are stable.  No orders of the defined types were placed in this encounter.   All questions were answered. The patient knows to call the clinic with any problems, questions or concerns.  A total of more than 30 minutes were spent on this encounter with face-to-face time and non-face-to-face time, including preparing to see the patient, ordering tests and/or  medications, counseling the patient and coordination of care as outlined above.   Ulysees Barns, MD Department of Hematology/Oncology [facility] at [facility] Phone: [phone] Pager: [phone] Email: [email or web address]  07/11/2023 8:49 PM

## 2023-07-12 ENCOUNTER — Telehealth: Payer: Self-pay

## 2023-07-12 NOTE — Telephone Encounter (Signed)
LVM informing pt to please call this RN regarding her lab results. Call back number [phone] provided.

## 2023-07-17 ENCOUNTER — Telehealth: Payer: Self-pay

## 2023-07-17 NOTE — Telephone Encounter (Signed)
-----   Message from Ulysees Barns IV sent at 07/11/2023  8:50 PM EDT ----- Thea Silversmith,  Could you please let Ms. Raether know that her iron levels were excellent.  She does not require any IV iron therapy at this time.  We will plan to see her back as scheduled in 6 months time. ----- Message ----- From: Leory Plowman, [facility] [place] Sent: 07/10/2023  11:26 AM EDT To: Jaci Standard, MD

## 2023-07-17 NOTE — Telephone Encounter (Signed)
This RN called pt to inform her of the below results. Pt verbalized understanding.

## 2023-09-05 ENCOUNTER — Other Ambulatory Visit: Payer: Self-pay | Admitting: Nurse Practitioner

## 2023-09-05 DIAGNOSIS — I10 Essential (primary) hypertension: Secondary | ICD-10-CM

## 2023-09-12 ENCOUNTER — Ambulatory Visit: Payer: Commercial Managed Care - PPO | Admitting: Nurse Practitioner

## 2023-10-05 ENCOUNTER — Ambulatory Visit: Payer: Commercial Managed Care - PPO | Admitting: Nurse Practitioner

## 2024-01-08 ENCOUNTER — Other Ambulatory Visit: Payer: Self-pay | Admitting: Hematology and Oncology

## 2024-01-08 ENCOUNTER — Inpatient Hospital Stay: Payer: Commercial Managed Care - PPO | Attending: Physician Assistant

## 2024-01-08 DIAGNOSIS — D500 Iron deficiency anemia secondary to blood loss (chronic): Secondary | ICD-10-CM

## 2024-01-08 DIAGNOSIS — D509 Iron deficiency anemia, unspecified: Secondary | ICD-10-CM | POA: Diagnosis present

## 2024-01-08 DIAGNOSIS — D75839 Thrombocytosis, unspecified: Secondary | ICD-10-CM | POA: Insufficient documentation

## 2024-01-08 LAB — RETIC PANEL
Immature Retic Fract: 5.1 % (ref 2.3–15.9)
RBC.: 3.5 MIL/uL — ABNORMAL LOW (ref 3.87–5.11)
Retic Count, Absolute: 48.6 10*3/uL (ref 19.0–186.0)
Retic Ct Pct: 1.4 % (ref 0.4–3.1)
Reticulocyte Hemoglobin: 36.6 pg (ref >27.9)

## 2024-01-08 LAB — CBC WITH DIFFERENTIAL (CANCER CENTER ONLY)
Abs Immature Granulocytes: 0.01 10*3/uL (ref 0.00–0.07)
Basophils Absolute: 0 10*3/uL (ref 0.0–0.1)
Basophils Relative: 1 %
Eosinophils Absolute: 0.3 10*3/uL (ref 0.0–0.5)
Eosinophils Relative: 5 %
HCT: 34.1 % — ABNORMAL LOW (ref 36.0–46.0)
Hemoglobin: 11.3 g/dL — ABNORMAL LOW (ref 12.0–15.0)
Immature Granulocytes: 0 %
Lymphocytes Relative: 45 %
Lymphs Abs: 2.5 10*3/uL (ref 0.7–4.0)
MCH: 32 pg (ref 26.0–34.0)
MCHC: 33.1 g/dL (ref 30.0–36.0)
MCV: 96.6 fL (ref 80.0–100.0)
Monocytes Absolute: 0.3 10*3/uL (ref 0.1–1.0)
Monocytes Relative: 5 %
Neutro Abs: 2.5 10*3/uL (ref 1.7–7.7)
Neutrophils Relative %: 44 %
Platelet Count: 286 10*3/uL (ref 150–400)
RBC: 3.53 MIL/uL — ABNORMAL LOW (ref 3.87–5.11)
RDW: 12.9 % (ref 11.5–15.5)
WBC Count: 5.5 10*3/uL (ref 4.0–10.5)
nRBC: 0 % (ref 0.0–0.2)

## 2024-01-08 LAB — CMP (CANCER CENTER ONLY)
ALT: 16 U/L (ref 0–44)
AST: 20 U/L (ref 15–41)
Albumin: 4.3 g/dL (ref 3.5–5.0)
Alkaline Phosphatase: 72 U/L (ref 38–126)
Anion gap: 5 (ref 5–15)
BUN: 18 mg/dL (ref 6–20)
CO2: 25 mmol/L (ref 22–32)
Calcium: 9.3 mg/dL (ref 8.9–10.3)
Chloride: 107 mmol/L (ref 98–111)
Creatinine: 1.3 mg/dL — ABNORMAL HIGH (ref 0.44–1.00)
GFR, Estimated: 56 mL/min — ABNORMAL LOW (ref >60)
Glucose, Bld: 90 mg/dL (ref 70–99)
Potassium: 4.3 mmol/L (ref 3.5–5.1)
Sodium: 137 mmol/L (ref 135–145)
Total Bilirubin: 0.4 mg/dL (ref 0.0–1.2)
Total Protein: 7.8 g/dL (ref 6.5–8.1)

## 2024-01-08 LAB — FERRITIN: Ferritin: 117 ng/mL (ref 11–307)

## 2024-01-08 LAB — IRON AND IRON BINDING CAPACITY (CC-WL,HP ONLY)
Iron: 59 ug/dL (ref 28–170)
Saturation Ratios: 18 % (ref 10.4–31.8)
TIBC: 323 ug/dL (ref 250–450)
UIBC: 264 ug/dL (ref 148–442)

## 2024-01-09 ENCOUNTER — Ambulatory Visit: Admitting: Nurse Practitioner

## 2024-01-14 ENCOUNTER — Telehealth: Payer: Self-pay | Admitting: Physician Assistant

## 2024-01-14 NOTE — Telephone Encounter (Signed)
 Per IB message " Patient wants to cancel 4/15 appt 3rd time calling " Appt canceled 4/14

## 2024-01-15 ENCOUNTER — Telehealth: Payer: Self-pay | Admitting: Nurse Practitioner

## 2024-01-15 ENCOUNTER — Ambulatory Visit (INDEPENDENT_AMBULATORY_CARE_PROVIDER_SITE_OTHER): Admitting: Nurse Practitioner

## 2024-01-15 ENCOUNTER — Encounter: Payer: Self-pay | Admitting: Nurse Practitioner

## 2024-01-15 ENCOUNTER — Ambulatory Visit: Payer: Commercial Managed Care - PPO | Admitting: Physician Assistant

## 2024-01-15 VITALS — BP 124/82 | HR 93 | Temp 98.4°F | Ht 63.0 in | Wt 220.8 lb

## 2024-01-15 DIAGNOSIS — N1831 Chronic kidney disease, stage 3a: Secondary | ICD-10-CM

## 2024-01-15 DIAGNOSIS — I10 Essential (primary) hypertension: Secondary | ICD-10-CM

## 2024-01-15 DIAGNOSIS — Z13220 Encounter for screening for lipoid disorders: Secondary | ICD-10-CM | POA: Diagnosis not present

## 2024-01-15 DIAGNOSIS — L02213 Cutaneous abscess of chest wall: Secondary | ICD-10-CM

## 2024-01-15 DIAGNOSIS — Z136 Encounter for screening for cardiovascular disorders: Secondary | ICD-10-CM

## 2024-01-15 DIAGNOSIS — R7303 Prediabetes: Secondary | ICD-10-CM | POA: Diagnosis not present

## 2024-01-15 LAB — URINALYSIS, MICROSCOPIC ONLY

## 2024-01-15 LAB — MICROALBUMIN / CREATININE URINE RATIO
Creatinine,U: 198.7 mg/dL
Microalb Creat Ratio: UNDETERMINED mg/g (ref 0.0–30.0)
Microalb, Ur: <0.7 mg/dL

## 2024-01-15 LAB — LIPID PANEL
Cholesterol: 146 mg/dL (ref 0–200)
HDL: 56.9 mg/dL (ref >39.00)
LDL Cholesterol: 81 mg/dL (ref 0–99)
NonHDL: 89.53
Total CHOL/HDL Ratio: 3
Triglycerides: 42 mg/dL (ref 0.0–149.0)
VLDL: 8.4 mg/dL (ref 0.0–40.0)

## 2024-01-15 LAB — HEMOGLOBIN A1C: Hgb A1c MFr Bld: 5.5 % (ref 4.6–6.5)

## 2024-01-15 MED ORDER — CEPHALEXIN 500 MG PO CAPS
500.0000 mg | ORAL_CAPSULE | Freq: Three times a day (TID) | ORAL | 0 refills | Status: DC
Start: 2024-01-15 — End: 2024-01-22

## 2024-01-15 NOTE — Assessment & Plan Note (Signed)
 Repeat hgbA1c

## 2024-01-15 NOTE — Assessment & Plan Note (Signed)
 BP at goal with amlodipine BP Readings from Last 3 Encounters:  01/15/24 124/82  07/10/23 (!) 136/101  06/12/23 (!) 139/96    Maintain med dose

## 2024-01-15 NOTE — Telephone Encounter (Signed)
 02/21/2023 same day cancel/family ER 01/09/2024 same day cancel/family ER  Final warning sent via mail and mychart

## 2024-01-15 NOTE — Patient Instructions (Signed)
 Apply warm compress 2-3x/day, 10-50mins each time Call office if no improvement in 5days. Go to lab

## 2024-01-15 NOTE — Assessment & Plan Note (Signed)
 Check urine today, lipid panel and hgbA1c Stable eGFr per recent CMP BP at goal

## 2024-01-15 NOTE — Progress Notes (Signed)
 Established Patient Visit  Patient: Patty Gilmore   DOB: [birth year]   32 y.o. Female  MRN: [id number] Visit Date: 01/15/2024  Subjective:    Chief Complaint  Patient presents with   Medical Management of Chronic Issues    Pt is following up on health. Wants to touch base with Alysia Penna, NP.    Mass    Under right breast x 2-7 days. Soreness and redness.    Rash This is a new problem. The current episode started 1 to 4 weeks ago. The problem is unchanged. Location: right breast fold. The rash is characterized by pain and swelling. She was exposed to nothing. Pertinent negatives include no congestion, cough, fever or joint pain. Treatments tried: warm compress. The treatment provided no relief.  No oral abx used in last 3months. Denies any breast pain  Primary hypertension BP at goal with amlodipine BP Readings from Last 3 Encounters:  01/15/24 124/82  07/10/23 (!) 136/101  06/12/23 (!) 139/96    Maintain med dose  CKD (chronic kidney disease) stage 3, GFR 30-59 ml/min (HCC) Check urine today, lipid panel and hgbA1c Stable eGFr per recent CMP BP at goal  Prediabetes Repeat hgbA1c  Reviewed medical, surgical, and social history today  Medications: Outpatient Medications Prior to Visit  Medication Sig   amLODipine (NORVASC) 10 MG tablet TAKE 1 TABLET(10 MG) BY MOUTH DAILY   Facility-Administered Medications Prior to Visit  Medication Dose Route Frequency Provider   acetaminophen (TYLENOL) tablet 650 mg  650 mg Oral Once Desma Mcgregor, RPH   diphenhydrAMINE (BENADRYL) capsule 25 mg  25 mg Oral Once Desma Mcgregor, [facility]   iron sucrose (VENOFER) 200 mg in sodium chloride 0.9 % 100 mL IVPB  200 mg Intravenous Once Desma Mcgregor, [facility]   Reviewed past medical and social history.   ROS per HPI above      Objective:  BP 124/82   Pulse 93   Temp 98.4 F (36.9 C) (Temporal)   Ht 5\' 3"  (1.6 m)   Wt 220 lb 12.8 oz (100.2 kg)   LMP 12/30/2023   SpO2 94%    BMI 39.11 kg/m      Physical Exam Vitals and nursing note reviewed.  Cardiovascular:     Rate and Rhythm: Normal rate and regular rhythm.     Pulses: Normal pulses.     Heart sounds: Normal heart sounds.  Pulmonary:     Effort: Pulmonary effort is normal.     Breath sounds: Normal breath sounds.  Chest:     Chest wall: Tenderness present.  Breasts:    Breasts are symmetrical.  Lymphadenopathy:     Upper Body:     Right upper body: No supraclavicular, axillary or pectoral adenopathy.     Left upper body: No supraclavicular, axillary or pectoral adenopathy.  Skin:    Findings: Rash present. No erythema. Rash is nodular.       Neurological:     Mental Status: She is alert and oriented to person, place, and time.     No results found for any visits on 01/15/24.    Assessment & Plan:    Problem List Items Addressed This Visit     CKD (chronic kidney disease) stage 3, GFR 30-59 ml/min (HCC)   Check urine today, lipid panel and hgbA1c Stable eGFr per recent CMP BP at goal      Relevant Orders   Microalbumin /  creatinine urine ratio   Urine Microscopic Only   Prediabetes   Repeat hgbA1c      Relevant Orders   Hemoglobin A1c   Primary hypertension - Primary   BP at goal with amlodipine BP Readings from Last 3 Encounters:  01/15/24 124/82  07/10/23 (!) 136/101  06/12/23 (!) 139/96    Maintain med dose      Other Visit Diagnoses       Encounter for lipid screening for cardiovascular disease       Relevant Orders   Lipid panel     Abscess of chest wall       Relevant Medications   cephALEXin (KEFLEX) 500 MG capsule     Apply warm compress 2-3x/day, 10-15mins each time Call office if no improvement in 5days.  Return in about 3 months (around 04/15/2024) for CPE (fasting).     Kathrene Parents, NP

## 2024-01-21 ENCOUNTER — Other Ambulatory Visit: Payer: Self-pay | Admitting: Physician Assistant

## 2024-01-22 ENCOUNTER — Inpatient Hospital Stay: Admitting: Physician Assistant

## 2024-01-22 VITALS — BP 122/76 | HR 58 | Temp 97.3°F | Resp 17 | Wt 222.3 lb

## 2024-01-22 DIAGNOSIS — D500 Iron deficiency anemia secondary to blood loss (chronic): Secondary | ICD-10-CM

## 2024-01-22 DIAGNOSIS — D509 Iron deficiency anemia, unspecified: Secondary | ICD-10-CM | POA: Diagnosis not present

## 2024-01-22 NOTE — Progress Notes (Signed)
 [name] [phone]   [phone]  PROGRESS NOTE  Patient Care Team: Nche, Connye Delaine, NP as PCP - General (Internal Medicine)  Hematological/Oncological History # Normocytic Anemia # Thrombocytosis 08/22/2022: WBC 6.8, Hgb 11.8, MCV 94, Plt 539 10/09/2022: establish care with Dr. Rosaline Coma  1/23-12/04/2022: IV iron  sucrose 200 mg x 5 doses.   Interval History:  Patty Gilmore 33 y.o. female with medical history significant for iron  deficiency anemia who presents for a follow up visit. The patient's last visit was on 10/082024. In the interim since the last visit, she denies any changes to her health.   On exam today Patty Gilmore reports she has been feeling good in the interim since her last visit.  Her energy are good and she can complete her ADLs on her own. She reports her menstrual cycles are good lasting 3 days with 1 day of heavy bleeding. She denies nausea, vomiting or bowel habit changes. She tries to eat iron  rich foods. She denies fevers, chills, sweats, shortness of breath, chest pain or cough. She has no other complaints.  A full 10 point ROS is otherwise negative.  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   states she had "thickened muscle around her heart"   Hypertension    was taking amlodipine  stopped about 1 yr ago - no insurance - had been on meds since age 18   Pregnancy induced hypertension     SURGICAL HISTORY: Past Surgical History:  Procedure Laterality Date   CESAREAN SECTION N/A 05/04/2016   Procedure: CESAREAN SECTION;  Surgeon: Albino Hum, MD;  Location: [facility] BIRTHING SUITES;  Service: Obstetrics;  Laterality: N/A;   LUMBAR PUNCTURE  07/20/2014   fluid build up around spinal column   NO PAST SURGERIES      SOCIAL HISTORY: Social History   Socioeconomic History   Marital status: Single    Spouse name: Not on file   Number of children: 1   Years of  education: Not on file   Highest education level: Not on file  Occupational History    Employer: [employer]    Comment: [employer]  Tobacco Use   Smoking status: Never   Smokeless tobacco: Never  Vaping Use   Vaping status: Never Used  Substance and Sexual Activity   Alcohol use: No    Comment: Rarely (less than once per month)   Drug use: Not Currently    Types: Marijuana   Sexual activity: Not Currently  Other Topics Concern   Not on file  Social History Narrative   Not on file   Social Drivers of Health   Financial Resource Strain: Not on file  Food Insecurity: Not on file  Transportation Needs: Not on file  Physical Activity: Not on file  Stress: Not on file  Social Connections: Not on file  Intimate Partner Violence: Not on file    FAMILY HISTORY: Family History  Problem Relation Age of Onset   Diabetes Maternal Grandmother    Diabetes Paternal Grandmother    Thyroid  disease Mother     ALLERGIES:  has no known allergies.  MEDICATIONS:  Current Outpatient Medications  Medication Sig Dispense Refill   amLODipine  (NORVASC ) 10 MG tablet TAKE 1 TABLET(10 MG) BY MOUTH DAILY 90 tablet 3   cephALEXin  (KEFLEX ) 500 MG capsule Take 1 capsule (500 mg total) by mouth 3 (three) times daily. (Patient not taking: Reported on 01/22/2024) 21  capsule 0   No current facility-administered medications for this visit.   Facility-Administered Medications Ordered in Other Visits  Medication Dose Route Frequency Provider Last Rate Last Admin   acetaminophen  (TYLENOL ) tablet 650 mg  650 mg Oral Once Patel, Yatin, [facility]       diphenhydrAMINE  (BENADRYL ) capsule 25 mg  25 mg Oral Once Patel, Yatin, [facility]       iron  sucrose (VENOFER ) 200 mg in sodium chloride  0.9 % 100 mL IVPB  200 mg Intravenous Once Patel, Yatin, [facility]        REVIEW OF SYSTEMS:   Constitutional: ( - ) fevers, ( - )  chills , ( - ) night sweats Eyes: ( - ) blurriness of vision, ( - ) double vision, ( - ) watery  eyes Ears, nose, mouth, throat, and face: ( - ) mucositis, ( - ) sore throat Respiratory: ( - ) cough, ( - ) dyspnea, ( - ) wheezes Cardiovascular: ( - ) palpitation, ( - ) chest discomfort, ( - ) lower extremity swelling Gastrointestinal:  ( - ) nausea, ( - ) heartburn, ( - ) change in bowel habits Skin: ( - ) abnormal skin rashes Lymphatics: ( - ) new lymphadenopathy, ( - ) easy bruising Neurological: ( - ) numbness, ( - ) tingling, ( - ) new weaknesses Behavioral/Psych: ( - ) mood change, ( - ) new changes  All other systems were reviewed with the patient and are negative.  PHYSICAL EXAMINATION:  Vitals:   01/22/24 1200  BP: 122/76  Pulse: (!) 58  Resp: 17  Temp: (!) 97.3 F (36.3 C)    Filed Weights   01/22/24 1200  Weight: 222 lb 5 oz (100.8 kg)     GENERAL: Well-appearing middle-aged African-American female, alert, no distress and comfortable SKIN: skin color, texture, turgor are normal, no rashes or significant lesions EYES: conjunctiva are pink and non-injected, sclera clear LUNGS: clear to auscultation and percussion with normal breathing effort HEART: regular rate & rhythm and no murmurs and no lower extremity edema Musculoskeletal: no cyanosis of digits and no clubbing  PSYCH: alert & oriented x 3, fluent speech NEURO: no focal motor/sensory deficits  LABORATORY DATA:  I have reviewed the data as listed    Latest Ref Rng & Units 01/08/2024   10:49 AM 07/10/2023   11:11 AM 01/08/2023   10:55 AM  CBC  WBC 4.0 - 10.5 K/uL 5.5  6.2  7.0   Hemoglobin 12.0 - 15.0 g/dL 40.9  81.1  91.4   Hematocrit 36.0 - 46.0 % 34.1  33.6  33.9   Platelets 150 - 400 K/uL 286  263  299        Latest Ref Rng & Units 01/08/2024   10:49 AM 07/10/2023   11:11 AM 01/08/2023   10:55 AM  CMP  Glucose 70 - 99 mg/dL 90  91  82   BUN 6 - 20 mg/dL 18  12  11    Creatinine 0.44 - 1.00 mg/dL 7.82  9.56  2.13   Sodium 135 - 145 mmol/L 137  138  136   Potassium 3.5 - 5.1 mmol/L 4.3  4.4  4.5    Chloride 98 - 111 mmol/L 107  109  107   CO2 22 - 32 mmol/L 25  23  21    Calcium  8.9 - 10.3 mg/dL 9.3  9.5  9.2   Total Protein 6.5 - 8.1 g/dL 7.8  7.3  7.4   Total Bilirubin 0.0 -  1.2 mg/dL 0.4  0.5  0.3   Alkaline Phos 38 - 126 U/L 72  64  71   AST 15 - 41 U/L 20  17  18    ALT 0 - 44 U/L 16  15  20      RADIOGRAPHIC STUDIES: No results found.  ASSESSMENT & PLAN Patty Gilmore is a 33 y.o. female with medical history significant for iron  deficiency anemia who presents for a follow up visit.  #Thrombocytosis, likely 2/2 to Iron  Deficiency-resolved # Mild Normocytic Anemia -stable # Iron  Deficiency Anemia --Last received IV venofer  200 mg x 5 doses from 10/24/22-12/04/2022 --Labs from 01/08/2024 show mild anemia with Hgb 11.3. Suspect residual anemia is secondary to CKD. Iron  panel shows no deficiency with ferritin 117, saturation 18% --No need for IV iron  at this time.  --Recommend continuation with iron  rich foods at this time. --RTC in 6 months for repeat evaluation to assure her hemoglobin and iron  levels are stable.  No orders of the defined types were placed in this encounter.   All questions were answered. The patient knows to call the clinic with any problems, questions or concerns.   I have spent a total of 25 minutes minutes of face-to-face and non-face-to-face time, preparing to see the patient,performing a medically appropriate examination, counseling and educating the patient, documenting clinical information in the electronic health record, independently interpreting results and communicating results to the patient, and care coordination.   Wyline Hearing PA-C Dept of Hematology and Oncology [facility] Phone: [phone]   01/22/2024 12:11 PM

## 2024-02-13 ENCOUNTER — Other Ambulatory Visit: Payer: Self-pay | Admitting: Nurse Practitioner

## 2024-02-13 DIAGNOSIS — I10 Essential (primary) hypertension: Secondary | ICD-10-CM

## 2024-04-16 ENCOUNTER — Ambulatory Visit: Admitting: Nurse Practitioner

## 2024-04-25 ENCOUNTER — Ambulatory Visit: Admitting: Nurse Practitioner

## 2024-05-21 ENCOUNTER — Other Ambulatory Visit (HOSPITAL_COMMUNITY)
Admission: RE | Admit: 2024-05-21 | Discharge: 2024-05-21 | Disposition: A | Discharge status: Home / Self Care | Source: Ambulatory Visit | Attending: Nurse Practitioner | Admitting: Nurse Practitioner

## 2024-05-21 ENCOUNTER — Encounter: Payer: Self-pay | Admitting: Nurse Practitioner

## 2024-05-21 ENCOUNTER — Ambulatory Visit: Admitting: Nurse Practitioner

## 2024-05-21 VITALS — BP 136/84 | HR 82 | Temp 98.4°F | Ht 63.0 in | Wt 212.8 lb

## 2024-05-21 DIAGNOSIS — Z0001 Encounter for general adult medical examination with abnormal findings: Secondary | ICD-10-CM | POA: Diagnosis not present

## 2024-05-21 DIAGNOSIS — Z23 Encounter for immunization: Secondary | ICD-10-CM

## 2024-05-21 DIAGNOSIS — N2889 Other specified disorders of kidney and ureter: Secondary | ICD-10-CM | POA: Diagnosis not present

## 2024-05-21 DIAGNOSIS — L0591 Pilonidal cyst without abscess: Secondary | ICD-10-CM | POA: Diagnosis not present

## 2024-05-21 DIAGNOSIS — Z1159 Encounter for screening for other viral diseases: Secondary | ICD-10-CM

## 2024-05-21 DIAGNOSIS — I10 Essential (primary) hypertension: Secondary | ICD-10-CM | POA: Diagnosis not present

## 2024-05-21 DIAGNOSIS — Z113 Encounter for screening for infections with a predominantly sexual mode of transmission: Secondary | ICD-10-CM | POA: Diagnosis not present

## 2024-05-21 DIAGNOSIS — D510 Vitamin B12 deficiency anemia due to intrinsic factor deficiency: Secondary | ICD-10-CM | POA: Diagnosis not present

## 2024-05-21 DIAGNOSIS — N1831 Chronic kidney disease, stage 3a: Secondary | ICD-10-CM

## 2024-05-21 LAB — RENAL FUNCTION PANEL
Albumin: 4.1 g/dL (ref 3.5–5.2)
BUN: 16 mg/dL (ref 6–23)
CO2: 26 meq/L (ref 19–32)
Calcium: 9.1 mg/dL (ref 8.4–10.5)
Chloride: 104 meq/L (ref 96–112)
Creatinine, Ser: 1.33 mg/dL — ABNORMAL HIGH (ref 0.40–1.20)
GFR: 53.11 mL/min — ABNORMAL LOW (ref >60.00)
Glucose, Bld: 86 mg/dL (ref 70–99)
Phosphorus: 3.4 mg/dL (ref 2.3–4.6)
Potassium: 4.1 meq/L (ref 3.5–5.1)
Sodium: 136 meq/L (ref 135–145)

## 2024-05-21 LAB — CBC WITH DIFFERENTIAL/PLATELET
Basophils Absolute: 0 K/uL (ref 0.0–0.1)
Basophils Relative: 0.7 % (ref 0.0–3.0)
Eosinophils Absolute: 0.1 K/uL (ref 0.0–0.7)
Eosinophils Relative: 1.8 % (ref 0.0–5.0)
HCT: 33.4 % — ABNORMAL LOW (ref 36.0–46.0)
Hemoglobin: 11.3 g/dL — ABNORMAL LOW (ref 12.0–15.0)
Lymphocytes Relative: 30.6 % (ref 12.0–46.0)
Lymphs Abs: 1.5 K/uL (ref 0.7–4.0)
MCHC: 34 g/dL (ref 30.0–36.0)
MCV: 96.2 fl (ref 78.0–100.0)
Monocytes Absolute: 0.3 K/uL (ref 0.1–1.0)
Monocytes Relative: 5.6 % (ref 3.0–12.0)
Neutro Abs: 3.1 K/uL (ref 1.4–7.7)
Neutrophils Relative %: 61.3 % (ref 43.0–77.0)
Platelets: 374 K/uL (ref 150.0–400.0)
RBC: 3.47 Mil/uL — ABNORMAL LOW (ref 3.87–5.11)
RDW: 13 % (ref 11.5–15.5)
WBC: 5 K/uL (ref 4.0–10.5)

## 2024-05-21 MED ORDER — CLINDAMYCIN HCL 300 MG PO CAPS: 300.0000 mg | ORAL_CAPSULE | Freq: Three times a day (TID) | ORAL | 0 refills | Status: AC

## 2024-05-21 NOTE — Patient Instructions (Signed)
 Schedule nurse visit for second hep B vaccine in 44month. Go to lab Maintain Heart healthy diet and daily exercise. Maintain current medications. Avoid all NSAIDs-ibuprofen , aleve , advil , naproxen , motrin . Use only tylenol  for pain

## 2024-05-21 NOTE — Progress Notes (Signed)
 Complete physical exam  Patient: Patty Gilmore   DOB: August 06, 1991   33 y.o. Female  MRN: [id number] Visit Date: 05/21/2024  Subjective:    Chief Complaint  Patient presents with   Annual Exam    Discuss referral to surgeon  DUE for Hep B vacc and Pneumococcal vacc    Patty Gilmore is a 33 y.o. female who presents today for a complete physical exam. She reports consuming a general diet. Walking daily She generally feels well. She reports sleeping well. She does have additional problems to discuss today.  Vision:No Dental:No STD Screen:Yes  BP Readings from Last 3 Encounters:  05/21/24 136/84  01/22/24 122/76  01/15/24 124/82   Wt Readings from Last 3 Encounters:  05/21/24 212 lb 12.8 oz (96.5 kg)  01/22/24 222 lb 5 oz (100.8 kg)  01/15/24 220 lb 12.8 oz (100.2 kg)   Most recent fall risk assessment:    05/21/2024    1:04 PM  Fall Risk   Falls in the past year? 0  Injury with Fall? 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 Onset 1week ago. Minimal improvement with sitz bathes. Use of ibuprofen  for pain even though she is aware of CKD diagnosis. States she did not get call from previous general surgery referral.  Sent clindamycin  300mg  TID Advised to avoid all NSAIDs. Use tylenol  and sitz bathe. Entered another referral to general surgery  CKD (chronic kidney disease) stage 3, GFR 30-59 ml/min (HCC) 1st noted 2015: Creatinine 1.12 to 1.40, BUN 15-18, Gfr 49-56 Normal UACr, and urine microscope Ct renal 2022: There is mild right-sided pelvicaliectasis with minimal stranding surrounding the right renal pelvis. No urinary tract calculi are seen. Kidneys otherwise appear within  normal limits. The bladder is within normal limits. Adrenal glands are within normal limits. 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   states she had thickened muscle around her heart   Hypertension    was taking amlodipine  stopped about 1 yr ago - no insurance - had been on meds since age 59   Pregnancy induced hypertension    Past Surgical History:  Procedure Laterality Date   CESAREAN SECTION N/A 05/04/2016   Procedure: CESAREAN SECTION;  Surgeon: Norleen LULLA Server, MD;  Location: [facility] BIRTHING SUIT[facility];  Service: Obstetrics;  Laterality: N/A;   LUMBAR PUNCTURE  07/20/2014   fluid build up around spinal column   NO PAST SURGERIES     Social History   Socioeconomic History   Marital status: Single    Spouse name: Not on file   Number of children: 1   Years of education: Not on file   Highest education level: 12th grade  Occupational History    Employer: [employer]    Comment:  [employer]  Tobacco Use   Smoking status: Never   Smokeless tobacco: Never  Vaping Use   Vaping status: Never Used  Substance and Sexual Activity   Alcohol use: No    Comment: Rarely (less than once per month)   Drug use: Not Currently    Types: Marijuana   Sexual activity: Not Currently    Birth control/protection: None  Other Topics Concern   Not on file  Social History Narrative   Not on file   Social Drivers of Health   Financial Resource Strain: Low Risk  (05/20/2024)   Overall Financial Resource Strain (CARDIA)    Difficulty of Paying Living Expenses: Not very hard  Food Insecurity: Food Insecurity Present (05/20/2024)   Hunger Vital Sign     Worried About Running Out of Food in the Last Year: Never true    Ran Out of Food in the Last Year: Sometimes true  Transportation Needs: No Transportation Needs (05/20/2024)   PRAPARE - Administrator, Civil Service (Medical): No    Lack of Transportation (Non-Medical): No  Physical Activity: Unknown (05/20/2024)   Exercise Vital Sign    Days of Exercise per Week: 5 days    Minutes of Exercise per Session: Not on file  Stress: No Stress Concern Present (05/20/2024)   [employer] of Occupational Health - Occupational Stress Questionnaire    Feeling of Stress: Not at all  Social Connections: Moderately Isolated (05/20/2024)   Social Connection and Isolation Panel    Frequency of Communication with Friends and Family: More than three times a week    Frequency of Social Gatherings with Friends and Family: Twice a week    Attends Religious Services: 1 to 4 times per year    Active Member of [employer] or Organizations: No    Attends Engineer, structural: Not on file    Marital Status: Never married  Catering manager Violence: Not on file   Family Status  Relation Name Status   MGM Ronal Brodie Alive   [facility]  Deceased   Mother  Alive       grave's disease   Father  Alive       healthy   Sister  Alive       healthy   Brother  Alive       healthy   MGF  Alive   PGF  Alive   Brother  Alive  No partnership data on file   Family History  Problem Relation Age of Onset   Diabetes Maternal Grandmother    COPD Maternal Grandmother    Diabetes Paternal Grandmother    Thyroid  disease Mother    No Known Allergies  Patient Care Team: Jaimen Melone, Roselie Rockford, NP as PCP - General (Internal Medicine)   Medications: Outpatient Medications Prior to Visit  Medication Sig   amLODipine  (NORVASC ) 10 MG tablet TAKE 1 TABLET(10 MG) BY MOUTH DAILY   Facility-Administered Medications Prior to Visit  Medication Dose Route Frequency Provider   acetaminophen  (TYLENOL ) tablet 650 mg   650 mg Oral Once Patel, Yatin, [facility]   diphenhydrAMINE  (BENADRYL ) capsule 25 mg  25 mg Oral Once Patel, Yatin, RPH   iron  sucrose (VENOFER ) 200 mg in sodium chloride  0.9 % 100 mL IVPB  200 mg Intravenous Once Tobie Holms, RPH    Review of Systems  Constitutional:  Negative for activity change, appetite change and unexpected weight change.  Respiratory: Negative.    Cardiovascular: Negative.   Gastrointestinal:  Negative.   Endocrine: Negative for cold intolerance and heat intolerance.  Genitourinary: Negative.   Musculoskeletal: Negative.   Skin:  Positive for wound.  Neurological: Negative.   Hematological: Negative.   Psychiatric/Behavioral:  Negative for behavioral problems, decreased concentration, dysphoric mood, hallucinations, self-injury, sleep disturbance and suicidal ideas. The patient is not nervous/anxious.    Last CBC Lab Results  Component Value Date   WBC 5.5 01/08/2024   HGB 11.3 (L) 01/08/2024   HCT 34.1 (L) 01/08/2024   MCV 96.6 01/08/2024   MCH 32.0 01/08/2024   RDW 12.9 01/08/2024   PLT 286 01/08/2024   Last metabolic panel Lab Results  Component Value Date   GLUCOSE 90 01/08/2024   NA 137 01/08/2024   K 4.3 01/08/2024   CL 107 01/08/2024   CO2 25 01/08/2024   BUN 18 01/08/2024   CREATININE 1.30 (H) 01/08/2024   GFRNONAA 56 (L) 01/08/2024   CALCIUM  9.3 01/08/2024   PROT 7.8 01/08/2024   ALBUMIN 4.3 01/08/2024   LABGLOB 2.8 09/13/2020   AGRATIO 1.5 09/13/2020   BILITOT 0.4 01/08/2024   ALKPHOS 72 01/08/2024   AST 20 01/08/2024   ALT 16 01/08/2024   ANIONGAP 5 01/08/2024   Last lipids Lab Results  Component Value Date   CHOL 146 01/15/2024   HDL 56.90 01/15/2024   LDLCALC 81 01/15/2024   TRIG 42.0 01/15/2024   CHOLHDL 3 01/15/2024   Last hemoglobin A1c Lab Results  Component Value Date   HGBA1C 5.5 01/15/2024   Last thyroid  functions Lab Results  Component Value Date   TSH 1.050 09/13/2020        Objective:  BP 136/84 (BP  Location: Left Arm, Patient Position: Sitting, Cuff Size: Large)   Pulse 82   Temp 98.4 F (36.9 C) (Oral)   Ht 5' 3 (1.6 m)   Wt 212 lb 12.8 oz (96.5 kg)   LMP 05/18/2024   SpO2 99%   BMI 37.70 kg/m     Physical Exam Vitals and nursing note reviewed.  Constitutional:      General: She is not in acute distress. HENT:     Right Ear: Tympanic membrane, ear canal and external ear normal.     Left Ear: Tympanic membrane, ear canal and external ear normal.     Nose: Nose normal.  Eyes:     Extraocular Movements: Extraocular movements intact.     Conjunctiva/sclera: Conjunctivae normal.     Pupils: Pupils are equal, round, and reactive to light.  Neck:     Thyroid : No thyroid  mass, thyromegaly or thyroid  tenderness.  Cardiovascular:     Rate and Rhythm: Normal rate and regular rhythm.     Pulses: Normal pulses.     Heart sounds: Normal heart sounds.  Pulmonary:     Effort: Pulmonary effort is normal.     Breath sounds: Normal breath sounds.  Abdominal:     General: Bowel sounds are normal.     Palpations: Abdomen is soft.  Musculoskeletal:        General: Normal range of motion.     Cervical back: Normal range of motion and neck supple.     Right lower leg: No edema.     Left lower leg: No edema.  Lymphadenopathy:     Cervical: No cervical adenopathy.  Skin:    General: Skin is warm and dry.     Findings: Lesion present.  Neurological:     Mental Status: She is alert and oriented to person,  place, and time.     Cranial Nerves: No cranial nerve deficit.  Psychiatric:        Mood and Affect: Mood normal.        Behavior: Behavior normal.        Thought Content: Thought content normal.      No results found for any visits on 05/21/24.    Assessment & Plan:    Routine Health Maintenance and Physical Exam  Immunization History  Administered Date(s) Administered   HPV 9-valent 08/22/2022, 12/07/2022   HPV Quadrivalent 01/19/2012   Hepb-cpg 05/21/2024    Influenza,inj,Quad PF,6+ Mos 01/13/2016, 06/14/2016, 11/08/2018   Tdap 01/26/2014, 04/24/2016   Health Maintenance  Topic Date Due   COVID-19 Vaccine (1 - 2024-25 season) 10/01/2024 (Originally 06/03/2023)   INFLUENZA VACCINE  12/30/2024 (Originally 05/02/2024)   Pneumococcal Vaccine (1 of 2 - PCV) 05/21/2025 (Originally 05/05/2011)   Hepatitis B Vaccines 19-59 Average Risk (2 of 2 - CpG 2-dose series) 06/18/2024   DTaP/Tdap/Td (3 - Td or Tdap) 04/24/2026   Cervical Cancer Screening (HPV/Pap Cotest)  04/02/2028   HPV VACCINES  Completed   Hepatitis C Screening  Completed   HIV Screening  Completed   Meningococcal B Vaccine  Aged Out   Discussed health benefits of physical activity, and encouraged her to engage in regular exercise appropriate for her age and condition.  Problem List Items Addressed This Visit     CKD (chronic kidney disease) stage 3, GFR 30-59 ml/min (HCC)   1st noted 2015: Creatinine 1.12 to 1.40, BUN 15-18, Gfr 49-56 Normal UACr, and urine microscope Ct renal 2022: There is mild right-sided pelvicaliectasis with minimal stranding surrounding the right renal pelvis. No urinary tract calculi are seen. Kidneys otherwise appear within normal limits. The bladder is within normal limits. Adrenal glands are within normal limits. BP at goal No DIABETES Normal lipid panel  check renal US  and bmp Check urine creatinine/protein      Relevant Orders   Renal Function Panel   Protein / creatinine ratio, urine   US  RENAL   Infected pilonidal cyst   Recurrent infection and drainage. Onset 1week ago. Minimal improvement with sitz bathes. Use of ibuprofen  for pain even though she is aware of CKD diagnosis. States she did not get call from previous general surgery referral.  Sent clindamycin  300mg  TID Advised to avoid all NSAIDs. Use tylenol  and sitz bathe. Entered another referral to general surgery      Relevant Medications   clindamycin  (CLEOCIN ) 300 MG capsule   Other  Relevant Orders   Ambulatory referral to General Surgery   Primary hypertension   BP at goal with amlodipine  BP Readings from Last 3 Encounters:  05/21/24 136/84  01/22/24 122/76  01/15/24 124/82    Maintain med dose      Other Visit Diagnoses       Encounter for preventative adult health care exam with abnormal findings    -  Primary   Relevant Orders   CBC with Differential/Platelet     Need for hepatitis vaccination       Relevant Orders   Heplisav-B  (HepB-CPG) Vaccine (Completed)     Encounter for screening for viral disease       Relevant Orders   RPR   HIV Antibody (routine testing w rflx)   Hepatitis C antibody   Hep B Core Ab W/Reflex   Urine cytology ancillary only     Pelvicaliectasis       Relevant Orders  US  RENAL      Return in about 6 months (around 11/21/2024) for HTN and CKD.     Roselie Mood, NP

## 2024-05-21 NOTE — Assessment & Plan Note (Addendum)
 1st noted 2015: Creatinine 1.12 to 1.40, BUN 15-18, Gfr 49-56 Normal UACr, and urine microscope Ct renal 2022: There is mild right-sided pelvicaliectasis with minimal stranding surrounding the right renal pelvis. No urinary tract calculi are seen. Kidneys otherwise appear within normal limits. The bladder is within normal limits. Adrenal glands are within normal limits. BP at goal No DIABETES Normal lipid panel  check renal US  and bmp Check urine creatinine/protein

## 2024-05-21 NOTE — Assessment & Plan Note (Signed)
 BP at goal with amlodipine  BP Readings from Last 3 Encounters:  05/21/24 136/84  01/22/24 122/76  01/15/24 124/82    Maintain med dose

## 2024-05-21 NOTE — Assessment & Plan Note (Signed)
 Recurrent infection and drainage. Onset 1week ago. Minimal improvement with sitz bathes. Use of ibuprofen  for pain even though she is aware of CKD diagnosis. States she did not get call from previous general surgery referral.  Sent clindamycin  300mg  TID Advised to avoid all NSAIDs. Use tylenol  and sitz bathe. Entered another referral to general surgery

## 2024-05-22 ENCOUNTER — Ambulatory Visit: Payer: Self-pay | Admitting: Nurse Practitioner

## 2024-05-22 ENCOUNTER — Other Ambulatory Visit

## 2024-05-22 LAB — URINE CYTOLOGY ANCILLARY ONLY
Chlamydia: NEGATIVE
Comment: NEGATIVE
Comment: NEGATIVE
Comment: NORMAL
Neisseria Gonorrhea: NEGATIVE
Trichomonas: NEGATIVE

## 2024-05-22 LAB — PROTEIN / CREATININE RATIO, URINE
Creatinine, Urine: 278 mg/dL — ABNORMAL HIGH (ref 20–275)
Protein/Creat Ratio: 36 mg/g{creat} (ref 24–184)
Protein/Creatinine Ratio: 0.036 mg/mg{creat} (ref 0.024–0.184)
Total Protein, Urine: 10 mg/dL (ref 5–24)

## 2024-05-22 LAB — HEPATITIS C ANTIBODY: Hepatitis C Ab: NONREACTIVE

## 2024-05-22 LAB — RPR: RPR Ser Ql: NONREACTIVE

## 2024-05-22 LAB — HIV ANTIBODY (ROUTINE TESTING W REFLEX): HIV 1&2 Ab, 4th Generation: NONREACTIVE

## 2024-05-22 LAB — HEPATITIS B CORE AB W/REFLEX: Hep B Core Total Ab: NEGATIVE

## 2024-05-30 ENCOUNTER — Ambulatory Visit
Admission: RE | Admit: 2024-05-30 | Discharge: 2024-05-30 | Disposition: A | Discharge status: Home / Self Care | Source: Ambulatory Visit | Attending: Nurse Practitioner | Admitting: Nurse Practitioner

## 2024-05-30 DIAGNOSIS — N1831 Chronic kidney disease, stage 3a: Secondary | ICD-10-CM

## 2024-05-30 DIAGNOSIS — N2889 Other specified disorders of kidney and ureter: Secondary | ICD-10-CM

## 2024-06-27 ENCOUNTER — Telehealth: Payer: Self-pay | Admitting: Nurse Practitioner

## 2024-06-27 DIAGNOSIS — Z0279 Encounter for issue of other medical certificate: Secondary | ICD-10-CM

## 2024-06-27 NOTE — Telephone Encounter (Signed)
 Patient dropped off document phys screening form, to be filled out by provider. Patient requested to send it back via Call Patient to pick up within 7-days. Document is located in providers tray at front office.Please advise at Mobile [phone] (mobile)  pt dropped off paper work and I put in the dr box

## 2024-06-27 NOTE — Telephone Encounter (Signed)
 CLINICAL USE BELOW THIS LINE (use X to signify taken)  _X__Form received and placed in providers office for signature. __X__Form completed and at the front office. __X__Form completed & Called to notify pt ready for pick up __X__Charge sheet & copy of form in front office folder for office supervisor.

## 2024-06-27 NOTE — Telephone Encounter (Signed)
 Copied from CRM #[id number]. Topic: General - Other >> Jun 27, 2024 11:26 AM Drema MATSU wrote: Reason for CRM: Patient is needing a Healthy Wellness form signed by the doctor for her job by 09/30. She states that she is on her way with the form.

## 2024-07-08 ENCOUNTER — Ambulatory Visit: Admitting: Nurse Practitioner

## 2024-07-08 NOTE — Progress Notes (Deleted)
   Patty Gilmore September 08, 1991 [id number]   History:  33 y.o. G1P0101 presents for annual exam. Monthly cycles. H/O HTN, CKD3, prediabetes. Negative STD screening 05/21/24 at [facility].   Gynecologic History No LMP recorded.   Contraception/Family planning: coitus interruptus and condoms Sexually active: Yes  Health Maintenance Last Pap: 04/03/2023. Results were: ASCUS neg HPV Last mammogram: Not indicated Last colonoscopy: Not indicated Last Dexa: Not indicated     05/21/2024    1:04 PM  Depression screen PHQ 2/9  Decreased Interest 0  Down, Depressed, Hopeless 0  PHQ - 2 Score 0  Altered sleeping 0  Tired, decreased energy 1  Change in appetite 0  Feeling bad or failure about yourself  0  Trouble concentrating 0  Moving slowly or fidgety/restless 0  Suicidal thoughts 0  PHQ-9 Score 1  Difficult doing work/chores Not difficult at all     Past medical history, past surgical history, family history and social history were all reviewed and documented in the EPIC chart. Works at [employer]. 32 yo daughter.   ROS:  A ROS was performed and pertinent positives and negatives are included.  Exam:  There were no vitals filed for this visit.  There is no height or weight on file to calculate BMI.  General appearance:  Normal Thyroid :  Symmetrical, normal in size, without palpable masses or nodularity. Respiratory  Auscultation:  Clear without wheezing or rhonchi Cardiovascular  Auscultation:  Regular rate, without rubs, murmurs or gallops  Edema/varicosities:  Not grossly evident Abdominal  Soft,nontender, without masses, guarding or rebound.  Liver/spleen:  No organomegaly noted  Hernia:  None appreciated  Skin  Inspection:  Grossly normal Breasts: Examined lying and sitting.   Right: Without masses, retractions, nipple discharge or axillary adenopathy.   Left: Without masses, retractions, nipple discharge or axillary adenopathy. Pelvic: External genitalia:  no lesions               Urethra:  normal appearing urethra with no masses, tenderness or lesions              Bartholins and Skenes: normal                 Vagina: normal appearing vagina with normal color and discharge, no lesions              Cervix: no lesions Bimanual Exam:  Uterus:  no masses or tenderness              Adnexa: no mass, fullness, tenderness              Rectovaginal: Deferred              Anus:  normal, no lesions   Assessment/Plan:  33 y.o. G1P0101 for annual exam.   Well female exam with routine gynecological exam - Education provided on SBEs, importance of preventative screenings, current guidelines, high calcium  diet, regular exercise, and multivitamin daily. Labs with PCP.   Screening for cervical cancer - 2022, 2024 ASCUS neg HPV. Pap today per guidelines.   No follow-ups on file.     Patty DELENA Shutter DNP, 8:42 AM 07/08/2024

## 2024-07-16 ENCOUNTER — Other Ambulatory Visit: Payer: Self-pay | Admitting: Hematology and Oncology

## 2024-07-16 ENCOUNTER — Inpatient Hospital Stay: Attending: Physician Assistant

## 2024-07-16 DIAGNOSIS — D500 Iron deficiency anemia secondary to blood loss (chronic): Secondary | ICD-10-CM

## 2024-07-22 ENCOUNTER — Other Ambulatory Visit: Payer: Self-pay | Admitting: Surgery

## 2024-07-22 DIAGNOSIS — N6322 Unspecified lump in the left breast, upper inner quadrant: Secondary | ICD-10-CM

## 2024-07-22 NOTE — Progress Notes (Deleted)
 [facility] [phone]   [phone]  PROGRESS NOTE  Patient Care Team: Nche, Roselie Rockford, NP as PCP - General (Internal Medicine)  Hematological/Oncological History # Normocytic Anemia # Thrombocytosis 08/22/2022: WBC 6.8, Hgb 11.8, MCV 94, Plt 539 10/09/2022: establish care with Dr. Federico  1/23-12/04/2022: IV iron  sucrose 200 mg x 5 doses.   Interval History:  Patty Gilmore 33 y.o. female with medical history significant for iron  deficiency anemia who presents for a follow up visit. The patient's last visit was on 01/22/2024. In the interim since the last visit, she denies any changes to her health.   On exam today Patty Gilmore reports ***. She denies fevers, chills, sweats, shortness of breath, chest pain or cough. She has no other complaints.  A full 10 point ROS is otherwise negative.  MEDICAL HISTORY:  Past Medical History:  Diagnosis Date   Anemia    Anxiety After pregnancy   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   states she had thickened muscle around her heart   Hypertension    was taking amlodipine  stopped about 1 yr ago - no insurance - had been on meds since age 2   Pregnancy induced hypertension     SURGICAL HISTORY: Past Surgical History:  Procedure Laterality Date   CESAREAN SECTION N/A 05/04/2016   Procedure: CESAREAN SECTION;  Surgeon: Norleen LULLA Server, MD;  Location: [facility] BIRTHING SUITES;  Service: Obstetrics;  Laterality: N/A;   LUMBAR PUNCTURE  07/20/2014   fluid build up around spinal column   NO PAST SURGERIES      SOCIAL HISTORY: Social History   Socioeconomic History   Marital status: Single    Spouse name: Not on file   Number of children: 1   Years of education: Not on file   Highest education level: 12th grade  Occupational History    Employer: [employer]    Comment: [employer]  Tobacco Use   Smoking status: Never   Smokeless tobacco: Never  Vaping Use   Vaping status: Never Used  Substance  and Sexual Activity   Alcohol use: No    Comment: Rarely (less than once per month)   Drug use: Not Currently    Types: Marijuana   Sexual activity: Not Currently    Birth control/protection: None  Other Topics Concern   Not on file  Social History Narrative   Not on file   Social Drivers of Health   Financial Resource Strain: Low Risk  (05/20/2024)   Overall Financial Resource Strain (CARDIA)    Difficulty of Paying Living Expenses: Not very hard  Food Insecurity: Food Insecurity Present (05/20/2024)   Hunger Vital Sign    Worried About Running Out of Food in the Last Year: Never true    Ran Out of Food in the Last Year: Sometimes true  Transportation Needs: No Transportation Needs (05/20/2024)   PRAPARE - Administrator, Civil Service (Medical): No    Lack of Transportation (Non-Medical): No  Physical Activity: Unknown (05/20/2024)   Exercise Vital Sign    Days of Exercise per Week: 5 days    Minutes of Exercise per Session: Not on file  Stress: No Stress Concern Present (05/20/2024)   [employer] of Occupational Health - Occupational Stress Questionnaire    Feeling of Stress: Not at all  Social Connections: Moderately Isolated (05/20/2024)   Social Connection and Isolation Panel    Frequency of Communication with Friends and Family: More than three times  a week    Frequency of Social Gatherings with Friends and Family: Twice a week    Attends Religious Services: 1 to 4 times per year    Active Member of [employer] or Organizations: No    Attends Engineer, structural: Not on file    Marital Status: Never married  Catering manager Violence: Not on file    FAMILY HISTORY: Family History  Problem Relation Age of Onset   Diabetes Maternal Grandmother    COPD Maternal Grandmother    Diabetes Paternal Grandmother    Thyroid  disease Mother     ALLERGIES:  has no known allergies.  MEDICATIONS:  Current Outpatient Medications  Medication Sig Dispense  Refill   amLODipine  (NORVASC ) 10 MG tablet TAKE 1 TABLET(10 MG) BY MOUTH DAILY 90 tablet 1   clindamycin  (CLEOCIN ) 300 MG capsule Take 1 capsule (300 mg total) by mouth 3 (three) times daily. 21 capsule 0   No current facility-administered medications for this visit.   Facility-Administered Medications Ordered in Other Visits  Medication Dose Route Frequency Provider Last Rate Last Admin   acetaminophen  (TYLENOL ) tablet 650 mg  650 mg Oral Once Patel, Yatin, [facility]       diphenhydrAMINE  (BENADRYL ) capsule 25 mg  25 mg Oral Once Patel, Yatin, RPH       iron  sucrose (VENOFER ) 200 mg in sodium chloride  0.9 % 100 mL IVPB  200 mg Intravenous Once Patel, Yatin, [facility]        REVIEW OF SYSTEMS:   Constitutional: ( - ) fevers, ( - )  chills , ( - ) night sweats Eyes: ( - ) blurriness of vision, ( - ) double vision, ( - ) watery eyes Ears, nose, mouth, throat, and face: ( - ) mucositis, ( - ) sore throat Respiratory: ( - ) cough, ( - ) dyspnea, ( - ) wheezes Cardiovascular: ( - ) palpitation, ( - ) chest discomfort, ( - ) lower extremity swelling Gastrointestinal:  ( - ) nausea, ( - ) heartburn, ( - ) change in bowel habits Skin: ( - ) abnormal skin rashes Lymphatics: ( - ) new lymphadenopathy, ( - ) easy bruising Neurological: ( - ) numbness, ( - ) tingling, ( - ) new weaknesses Behavioral/Psych: ( - ) mood change, ( - ) new changes  All other systems were reviewed with the patient and are negative.  PHYSICAL EXAMINATION:  There were no vitals filed for this visit.   There were no vitals filed for this visit.    GENERAL: Well-appearing middle-aged African-American female, alert, no distress and comfortable SKIN: skin color, texture, turgor are normal, no rashes or significant lesions EYES: conjunctiva are pink and non-injected, sclera clear LUNGS: clear to auscultation and percussion with normal breathing effort HEART: regular rate & rhythm and no murmurs and no lower extremity  edema Musculoskeletal: no cyanosis of digits and no clubbing  PSYCH: alert & oriented x 3, fluent speech NEURO: no focal motor/sensory deficits  LABORATORY DATA:  I have reviewed the data as listed    Latest Ref Rng & Units 05/21/2024    1:45 PM 01/08/2024   10:49 AM 07/10/2023   11:11 AM  CBC  WBC 4.0 - 10.5 K/uL 5.0  5.5  6.2   Hemoglobin 12.0 - 15.0 g/dL 88.6  88.6  88.5   Hematocrit 36.0 - 46.0 % 33.4  34.1  33.6   Platelets 150.0 - 400.0 K/uL 374.0  286  263  Latest Ref Rng & Units 05/21/2024    1:45 PM 01/08/2024   10:49 AM 07/10/2023   11:11 AM  CMP  Glucose 70 - 99 mg/dL 86  90  91   BUN 6 - 23 mg/dL 16  18  12    Creatinine 0.40 - 1.20 mg/dL 8.66  8.69  8.69   Sodium 135 - 145 mEq/L 136  137  138   Potassium 3.5 - 5.1 mEq/L 4.1  4.3  4.4   Chloride 96 - 112 mEq/L 104  107  109   CO2 19 - 32 mEq/L 26  25  23    Calcium  8.4 - 10.5 mg/dL 9.1  9.3  9.5   Total Protein 6.5 - 8.1 g/dL  7.8  7.3   Total Bilirubin 0.0 - 1.2 mg/dL  0.4  0.5   Alkaline Phos 38 - 126 U/L  72  64   AST 15 - 41 U/L  20  17   ALT 0 - 44 U/L  16  15     RADIOGRAPHIC STUDIES: No results found.  ASSESSMENT & PLAN Patty Gilmore is a 33 y.o. female with medical history significant for iron  deficiency anemia who presents for a follow up visit.  #Thrombocytosis, likely 2/2 to Iron  Deficiency-resolved # Mild Normocytic Anemia -stable # Iron  Deficiency Anemia --Last received IV venofer  200 mg x 5 doses from 10/24/22-12/04/2022 --Labs from today show *** --No need for IV iron  at this time.  --Recommend continuation with iron  rich foods at this time. --RTC in 6 months for repeat evaluation to assure her hemoglobin and iron  levels are stable.  No orders of the defined types were placed in this encounter.   All questions were answered. The patient knows to call the clinic with any problems, questions or concerns.   I have spent a total of 25 minutes minutes of face-to-face and non-face-to-face  time, preparing to see the patient,performing a medically appropriate examination, counseling and educating the patient, documenting clinical information in the electronic health record, independently interpreting results and communicating results to the patient, and care coordination.   Johnston Police PA-C Dept of Hematology and Oncology [facility] at [facility] Phone: [phone]   07/22/2024 10:15 PM

## 2024-07-23 ENCOUNTER — Inpatient Hospital Stay: Admitting: Physician Assistant

## 2024-07-23 ENCOUNTER — Inpatient Hospital Stay

## 2024-08-04 ENCOUNTER — Encounter

## 2024-08-04 ENCOUNTER — Other Ambulatory Visit

## 2024-08-14 ENCOUNTER — Other Ambulatory Visit

## 2024-08-14 ENCOUNTER — Encounter

## 2024-08-20 ENCOUNTER — Inpatient Hospital Stay: Admitting: Physician Assistant

## 2024-08-20 ENCOUNTER — Inpatient Hospital Stay: Attending: Physician Assistant

## 2024-08-20 VITALS — BP 113/85 | HR 68 | Temp 97.7°F | Resp 16 | Ht 63.0 in | Wt 211.0 lb

## 2024-08-20 DIAGNOSIS — D500 Iron deficiency anemia secondary to blood loss (chronic): Secondary | ICD-10-CM

## 2024-08-20 DIAGNOSIS — D509 Iron deficiency anemia, unspecified: Secondary | ICD-10-CM | POA: Insufficient documentation

## 2024-08-20 DIAGNOSIS — D75839 Thrombocytosis, unspecified: Secondary | ICD-10-CM | POA: Diagnosis not present

## 2024-08-20 LAB — IRON AND IRON BINDING CAPACITY (CC-WL,HP ONLY)
Iron: 49 ug/dL (ref 28–170)
Saturation Ratios: 15 % (ref 10.4–31.8)
TIBC: 322 ug/dL (ref 250–450)
UIBC: 273 ug/dL

## 2024-08-20 LAB — CBC WITH DIFFERENTIAL (CANCER CENTER ONLY)
Abs Immature Granulocytes: 0.01 K/uL (ref 0.00–0.07)
Basophils Absolute: 0 K/uL (ref 0.0–0.1)
Basophils Relative: 0 %
Eosinophils Absolute: 0.1 K/uL (ref 0.0–0.5)
Eosinophils Relative: 3 %
HCT: 33.8 % — ABNORMAL LOW (ref 36.0–46.0)
Hemoglobin: 11.6 g/dL — ABNORMAL LOW (ref 12.0–15.0)
Immature Granulocytes: 0 %
Lymphocytes Relative: 46 %
Lymphs Abs: 2.5 K/uL (ref 0.7–4.0)
MCH: 32.4 pg (ref 26.0–34.0)
MCHC: 34.3 g/dL (ref 30.0–36.0)
MCV: 94.4 fL (ref 80.0–100.0)
Monocytes Absolute: 0.3 K/uL (ref 0.1–1.0)
Monocytes Relative: 6 %
Neutro Abs: 2.3 K/uL (ref 1.7–7.7)
Neutrophils Relative %: 45 %
Platelet Count: 300 K/uL (ref 150–400)
RBC: 3.58 MIL/uL — ABNORMAL LOW (ref 3.87–5.11)
RDW: 13.1 % (ref 11.5–15.5)
WBC Count: 5.2 K/uL (ref 4.0–10.5)
nRBC: 0 % (ref 0.0–0.2)

## 2024-08-20 LAB — CMP (CANCER CENTER ONLY)
ALT: 21 U/L (ref 0–44)
AST: 25 U/L (ref 15–41)
Albumin: 4.4 g/dL (ref 3.5–5.0)
Alkaline Phosphatase: 87 U/L (ref 38–126)
Anion gap: 12 (ref 5–15)
BUN: 19 mg/dL (ref 6–20)
CO2: 22 mmol/L (ref 22–32)
Calcium: 9.5 mg/dL (ref 8.9–10.3)
Chloride: 104 mmol/L (ref 98–111)
Creatinine: 1.28 mg/dL — ABNORMAL HIGH (ref 0.44–1.00)
GFR, Estimated: 57 mL/min — ABNORMAL LOW (ref >60)
Glucose, Bld: 85 mg/dL (ref 70–99)
Potassium: 4.6 mmol/L (ref 3.5–5.1)
Sodium: 137 mmol/L (ref 135–145)
Total Bilirubin: 0.4 mg/dL (ref 0.0–1.2)
Total Protein: 7.8 g/dL (ref 6.5–8.1)

## 2024-08-20 LAB — RETIC PANEL
Immature Retic Fract: 8.4 % (ref 2.3–15.9)
RBC.: 3.59 MIL/uL — ABNORMAL LOW (ref 3.87–5.11)
Retic Count, Absolute: 48.8 K/uL (ref 19.0–186.0)
Retic Ct Pct: 1.4 % (ref 0.4–3.1)
Reticulocyte Hemoglobin: 35.2 pg (ref >27.9)

## 2024-08-20 LAB — FERRITIN: Ferritin: 162 ng/mL (ref 11–307)

## 2024-08-20 NOTE — Progress Notes (Signed)
 [facility] [phone]   [phone]  PROGRESS NOTE  Patient Care Team: Nche, Roselie Rockford, NP as PCP - General (Internal Medicine)  Hematological/Oncological History # Normocytic Anemia # Thrombocytosis 08/22/2022: WBC 6.8, Hgb 11.8, MCV 94, Plt 539 10/09/2022: establish care with Dr. Federico  1/23-12/04/2022: IV iron  sucrose 200 mg x 5 doses.   Interval History:  Patty Gilmore 33 y.o. female with medical history significant for iron  deficiency anemia who presents for a follow up visit. The patient's last visit was on 01/22/2024. In the interim since the last visit, she denies any changes to her health.   On exam today [name] reports.  Her energy levels are good and she is able to complete all her daily activities on her own.  Her fatigue worsens during her menstrual cycle with some PMS symptoms.  She reports her cycle last 3 days with 1 to 2 days of heavy bleeding.  She denies nausea, vomiting or bowel habit changes.  She denies easy bruising or signs of active bleeding or menstrual cycle.. She denies fevers, chills, sweats, shortness of breath, chest pain or cough. She has no other complaints.  A full 10 point ROS is otherwise negative.  MEDICAL HISTORY:  Past Medical History:  Diagnosis Date   Anemia    Anxiety After pregnancy   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   states she had thickened muscle around her heart   Hypertension    was taking amlodipine  stopped about 1 yr ago - no insurance - had been on meds since age 50   Pregnancy induced hypertension     SURGICAL HISTORY: Past Surgical History:  Procedure Laterality Date   CESAREAN SECTION N/A 05/04/2016   Procedure: CESAREAN SECTION;  Surgeon: Norleen LULLA Server, MD;  Location: [facility] BIRTHING SUITES;  Service: Obstetrics;  Laterality: N/A;   LUMBAR PUNCTURE  07/20/2014   fluid build up around spinal column   NO PAST SURGERIES      SOCIAL HISTORY: Social History   Socioeconomic  History   Marital status: Single    Spouse name: Not on file   Number of children: 1   Years of education: Not on file   Highest education level: 12th grade  Occupational History    Employer: [employer]    Comment: [employer]  Tobacco Use   Smoking status: Never   Smokeless tobacco: Never  Vaping Use   Vaping status: Never Used  Substance and Sexual Activity   Alcohol use: No    Comment: Rarely (less than once per month)   Drug use: Not Currently    Types: Marijuana   Sexual activity: Not Currently    Birth control/protection: None  Other Topics Concern   Not on file  Social History Narrative   Not on file   Social Drivers of Health   Financial Resource Strain: Low Risk  (05/20/2024)   Overall Financial Resource Strain (CARDIA)    Difficulty of Paying Living Expenses: Not very hard  Food Insecurity: Food Insecurity Present (05/20/2024)   Hunger Vital Sign    Worried About Running Out of Food in the Last Year: Never true    Ran Out of Food in the Last Year: Sometimes true  Transportation Needs: No Transportation Needs (05/20/2024)   PRAPARE - Administrator, Civil Service (Medical): No    Lack of Transportation (Non-Medical): No  Physical Activity: Unknown (05/20/2024)   Exercise Vital Sign    Days of Exercise per  Week: 5 days    Minutes of Exercise per Session: Not on file  Stress: No Stress Concern Present (05/20/2024)   [employer] of Occupational Health - Occupational Stress Questionnaire    Feeling of Stress: Not at all  Social Connections: Moderately Isolated (05/20/2024)   Social Connection and Isolation Panel    Frequency of Communication with Friends and Family: More than three times a week    Frequency of Social Gatherings with Friends and Family: Twice a week    Attends Religious Services: 1 to 4 times per year    Active Member of [employer] or Organizations: No    Attends Engineer, Structural: Not on file    Marital Status: Never  married  Catering Manager Violence: Not on file    FAMILY HISTORY: Family History  Problem Relation Age of Onset   Diabetes Maternal Grandmother    COPD Maternal Grandmother    Diabetes Paternal Grandmother    Thyroid  disease Mother     ALLERGIES:  has no known allergies.  MEDICATIONS:  Current Outpatient Medications  Medication Sig Dispense Refill   amLODipine  (NORVASC ) 10 MG tablet TAKE 1 TABLET(10 MG) BY MOUTH DAILY 90 tablet 1   clindamycin  (CLEOCIN ) 300 MG capsule Take 1 capsule (300 mg total) by mouth 3 (three) times daily. (Patient not taking: Reported on 08/20/2024) 21 capsule 0   No current facility-administered medications for this visit.   Facility-Administered Medications Ordered in Other Visits  Medication Dose Route Frequency Provider Last Rate Last Admin   acetaminophen  (TYLENOL ) tablet 650 mg  650 mg Oral Once Patel, Yatin, [facility]       diphenhydrAMINE  (BENADRYL ) capsule 25 mg  25 mg Oral Once Patel, Yatin, RPH       iron  sucrose (VENOFER ) 200 mg in sodium chloride  0.9 % 100 mL IVPB  200 mg Intravenous Once Patel, Yatin, [facility]        REVIEW OF SYSTEMS:   Constitutional: ( - ) fevers, ( - )  chills , ( - ) night sweats Eyes: ( - ) blurriness of vision, ( - ) double vision, ( - ) watery eyes Ears, nose, mouth, throat, and face: ( - ) mucositis, ( - ) sore throat Respiratory: ( - ) cough, ( - ) dyspnea, ( - ) wheezes Cardiovascular: ( - ) palpitation, ( - ) chest discomfort, ( - ) lower extremity swelling Gastrointestinal:  ( - ) nausea, ( - ) heartburn, ( - ) change in bowel habits Skin: ( - ) abnormal skin rashes Lymphatics: ( - ) new lymphadenopathy, ( - ) easy bruising Neurological: ( - ) numbness, ( - ) tingling, ( - ) new weaknesses Behavioral/Psych: ( - ) mood change, ( - ) new changes  All other systems were reviewed with the patient and are negative.  PHYSICAL EXAMINATION:  Vitals:   08/20/24 1138  BP: 113/85  Pulse: 68  Resp: 16  Temp: 97.7 F (36.5  C)  SpO2: 100%     Filed Weights   08/20/24 1138  Weight: 211 lb (95.7 kg)      GENERAL: Well-appearing middle-aged African-American female, alert, no distress and comfortable SKIN: skin color, texture, turgor are normal, no rashes or significant lesions EYES: conjunctiva are pink and non-injected, sclera clear LUNGS: clear to auscultation and percussion with normal breathing effort HEART: regular rate & rhythm and no murmurs and no lower extremity edema Musculoskeletal: no cyanosis of digits and no clubbing  PSYCH: alert & oriented x 3,  fluent speech NEURO: no focal motor/sensory deficits  LABORATORY DATA:  I have reviewed the data as listed    Latest Ref Rng & Units 08/20/2024   11:03 AM 05/21/2024    1:45 PM 01/08/2024   10:49 AM  CBC  WBC 4.0 - 10.5 K/uL 5.2  5.0  5.5   Hemoglobin 12.0 - 15.0 g/dL 88.3  88.6  88.6   Hematocrit 36.0 - 46.0 % 33.8  33.4  34.1   Platelets 150 - 400 K/uL 300  374.0  286        Latest Ref Rng & Units 05/21/2024    1:45 PM 01/08/2024   10:49 AM 07/10/2023   11:11 AM  CMP  Glucose 70 - 99 mg/dL 86  90  91   BUN 6 - 23 mg/dL 16  18  12    Creatinine 0.40 - 1.20 mg/dL 8.66  8.69  8.69   Sodium 135 - 145 mEq/L 136  137  138   Potassium 3.5 - 5.1 mEq/L 4.1  4.3  4.4   Chloride 96 - 112 mEq/L 104  107  109   CO2 19 - 32 mEq/L 26  25  23    Calcium  8.4 - 10.5 mg/dL 9.1  9.3  9.5   Total Protein 6.5 - 8.1 g/dL  7.8  7.3   Total Bilirubin 0.0 - 1.2 mg/dL  0.4  0.5   Alkaline Phos 38 - 126 U/L  72  64   AST 15 - 41 U/L  20  17   ALT 0 - 44 U/L  16  15     RADIOGRAPHIC STUDIES: No results found.  ASSESSMENT & PLAN SHANLEY FURLOUGH is a 33 y.o. female with medical history significant for iron  deficiency anemia who presents for a follow up visit.  #Thrombocytosis, likely 2/2 to Iron  Deficiency-resolved # Mild Normocytic Anemia -stable # Iron  Deficiency Anemia --No longer on birth control since Julyh 2023 due to side effects.  --Last  received IV venofer  200 mg x 5 doses from 10/24/22-12/04/2022 --Unable to tolerate PO iron  due to constipation.  --Labs from today show borderline anemia with Hgb 11.6. MCV 94.4. Iron  panel pending. --Recommend continuation with iron  rich foods at this time. --Will determine if IV iron  is needed based on pending iron  labs.  --RTC in 6 months with repeat labs.   No orders of the defined types were placed in this encounter.   All questions were answered. The patient knows to call the clinic with any problems, questions or concerns.   I have spent a total of 25 minutes minutes of face-to-face and non-face-to-face time, preparing to see the patient,performing a medically appropriate examination, counseling and educating the patient, documenting clinical information in the electronic health record, independently interpreting results and communicating results to the patient, and care coordination.   Johnston Police PA-C Dept of Hematology and Oncology [facility] Phone: [phone]   08/20/2024 11:48 AM

## 2024-09-08 ENCOUNTER — Encounter

## 2024-09-08 ENCOUNTER — Other Ambulatory Visit

## 2024-09-23 ENCOUNTER — Other Ambulatory Visit: Payer: Self-pay | Admitting: Nurse Practitioner

## 2024-09-23 DIAGNOSIS — I10 Essential (primary) hypertension: Secondary | ICD-10-CM

## 2024-10-09 ENCOUNTER — Other Ambulatory Visit

## 2024-10-09 ENCOUNTER — Encounter

## 2024-11-21 ENCOUNTER — Ambulatory Visit: Admitting: Nurse Practitioner

## 2025-02-17 ENCOUNTER — Inpatient Hospital Stay: Admitting: Physician Assistant

## 2025-02-17 ENCOUNTER — Inpatient Hospital Stay
